# Patient Record
Sex: Female | Born: 1935 | Race: Black or African American | Hispanic: No | Smoking: Former smoker
Health system: Southern US, Community
[De-identification: ages and names within clinical notes are randomized; demographics above are authoritative.]

## PROBLEM LIST (undated history)

## (undated) DIAGNOSIS — I10 Essential (primary) hypertension: Secondary | ICD-10-CM

## (undated) DIAGNOSIS — J40 Bronchitis, not specified as acute or chronic: Secondary | ICD-10-CM

## (undated) DIAGNOSIS — J449 Chronic obstructive pulmonary disease, unspecified: Secondary | ICD-10-CM

## (undated) HISTORY — PX: TUBAL LIGATION: SHX77

## (undated) HISTORY — PX: EYE SURGERY: SHX253

---

## 1999-10-23 ENCOUNTER — Ambulatory Visit (HOSPITAL_COMMUNITY): Admission: RE | Admit: 1999-10-23 | Discharge: 1999-10-23 | Payer: Self-pay | Admitting: *Deleted

## 1999-11-05 ENCOUNTER — Other Ambulatory Visit: Admission: RE | Admit: 1999-11-05 | Discharge: 1999-11-05 | Payer: Self-pay | Admitting: Family Medicine

## 1999-11-17 ENCOUNTER — Encounter: Payer: Self-pay | Admitting: Family Medicine

## 1999-11-17 ENCOUNTER — Ambulatory Visit (HOSPITAL_COMMUNITY): Admission: RE | Admit: 1999-11-17 | Discharge: 1999-11-17 | Payer: Self-pay | Admitting: Family Medicine

## 2000-12-23 ENCOUNTER — Ambulatory Visit (HOSPITAL_COMMUNITY): Admission: RE | Admit: 2000-12-23 | Discharge: 2000-12-23 | Payer: Self-pay | Admitting: Family Medicine

## 2001-06-26 ENCOUNTER — Encounter: Admission: RE | Admit: 2001-06-26 | Discharge: 2001-06-26 | Payer: Self-pay | Admitting: Family Medicine

## 2001-06-26 ENCOUNTER — Encounter: Payer: Self-pay | Admitting: Family Medicine

## 2004-05-08 ENCOUNTER — Ambulatory Visit (HOSPITAL_COMMUNITY): Admission: RE | Admit: 2004-05-08 | Discharge: 2004-05-08 | Payer: Self-pay | Admitting: Family Medicine

## 2004-08-26 ENCOUNTER — Ambulatory Visit (HOSPITAL_COMMUNITY): Admission: RE | Admit: 2004-08-26 | Discharge: 2004-08-26 | Payer: Self-pay | Admitting: Gastroenterology

## 2005-06-15 ENCOUNTER — Ambulatory Visit (HOSPITAL_COMMUNITY): Admission: RE | Admit: 2005-06-15 | Discharge: 2005-06-15 | Payer: Self-pay | Admitting: Family Medicine

## 2006-08-01 ENCOUNTER — Ambulatory Visit (HOSPITAL_COMMUNITY): Admission: RE | Admit: 2006-08-01 | Discharge: 2006-08-01 | Payer: Self-pay | Admitting: Family Medicine

## 2007-04-24 ENCOUNTER — Encounter: Admission: RE | Admit: 2007-04-24 | Discharge: 2007-04-24 | Payer: Self-pay | Admitting: Family Medicine

## 2007-08-17 ENCOUNTER — Ambulatory Visit (HOSPITAL_COMMUNITY): Admission: RE | Admit: 2007-08-17 | Discharge: 2007-08-17 | Payer: Self-pay | Admitting: Internal Medicine

## 2007-11-20 ENCOUNTER — Ambulatory Visit (HOSPITAL_COMMUNITY): Admission: RE | Admit: 2007-11-20 | Discharge: 2007-11-20 | Payer: Self-pay | Admitting: Obstetrics

## 2007-12-06 ENCOUNTER — Encounter (INDEPENDENT_AMBULATORY_CARE_PROVIDER_SITE_OTHER): Payer: Self-pay | Admitting: Obstetrics

## 2007-12-06 ENCOUNTER — Ambulatory Visit (HOSPITAL_COMMUNITY): Admission: RE | Admit: 2007-12-06 | Discharge: 2007-12-06 | Payer: Self-pay | Admitting: Obstetrics

## 2007-12-16 ENCOUNTER — Emergency Department (HOSPITAL_COMMUNITY): Admission: EM | Admit: 2007-12-16 | Discharge: 2007-12-16 | Payer: Self-pay | Admitting: Emergency Medicine

## 2008-08-20 ENCOUNTER — Ambulatory Visit (HOSPITAL_COMMUNITY): Admission: RE | Admit: 2008-08-20 | Discharge: 2008-08-20 | Payer: Self-pay | Admitting: Family Medicine

## 2010-04-28 ENCOUNTER — Encounter: Admission: RE | Admit: 2010-04-28 | Discharge: 2010-04-28 | Payer: Self-pay | Admitting: Family Medicine

## 2011-03-16 NOTE — Op Note (Signed)
Sheri [name], Sheri [name]               ACCOUNT NO.:  [id number]   MEDICAL RECORD NO.:  [id number]          PATIENT TYPE:  AMB   LOCATION:  SDC                           FACILITY:  [facility]   PHYSICIAN:  Kathreen Cosier, M.D.DATE OF BIRTH:  1926-04-23   DATE OF PROCEDURE:  12/06/2007  DATE OF DISCHARGE:                               OPERATIVE REPORT   PREOPERATIVE DIAGNOSIS:  Postmenopausal bleeding.   POSTOPERATIVE DIAGNOSIS:  Postmenopausal bleeding.   PROCEDURE:  Hysteroscopy D&C.   Using MAC with the patient in the lithotomy position, the perineum and  vagina were prepped and draped.  The bladder was emptied with a straight  catheter.  Bimanual exam revealed the uterus to be small.  A speculum  was placed in the vagina.  Cervix was injected with 9 mL of 1%  Xylocaine.  The anterior lip of the cervix was grasped with a tenaculum.  The  endocervix was curetted and a small amount of tissue obtained.  The  endometrial cavity sounded to 9 cm, the cervix dilated to a #25 Pratt,  and the hysteroscope inserted.  The cavity was noted to be normal.  The  hysteroscope was removed.  Fluid deficit was  25 mL.  A sharp curettage  was then performed.  A small amount of tissue was obtained.   The patient tolerated the procedure well.  She was taken to the recovery  room in good condition.           ______________________________  Kathreen Cosier, M.D.     BAM/MEDQ  D:  12/06/2007  T:  12/06/2007  Job:  [id number]

## 2011-03-19 NOTE — Op Note (Signed)
NAMESANDY, HAYE               ACCOUNT NO.:  [id number]   MEDICAL RECORD NO.:  [id number]          PATIENT TYPE:  AMB   LOCATION:  ENDO                         FACILITY:  [facility]   PHYSICIAN:  Anselmo Rod, M.D.  DATE OF BIRTH:  05/23/1926   DATE OF PROCEDURE:  08/26/2004  DATE OF DISCHARGE:                                 OPERATIVE REPORT   PROCEDURE PERFORMED:  Colonoscopy.   ENDOSCOPIST:  Charna Elizabeth, M.D.   INSTRUMENT USED:  Olympus video colonoscope.   INDICATIONS FOR PROCEDURE:  The patient is a 76 year old African-American  female with mild anemia.  Hemoglobin 11.8 g/dl undergoing screening  colonoscopy to rule out colonic polyps, masses, etc.   PREPROCEDURE PREPARATION:  Informed consent was procured from the patient.  The patient was fasted for eight hours prior to the procedure and prepped  with a bottle of magnesium citrate and a gallon of GoLYTELY the night prior  to the procedure.   PREPROCEDURE PHYSICAL:  The patient had stable vital signs.  Neck supple.  Chest clear to auscultation.  S1 and S2 regular.  No murmurs, rubs or  gallops, rales, rhonchi or wheezing.  Abdomen soft with normal bowel sounds.   DESCRIPTION OF PROCEDURE:  The patient was placed in left lateral decubitus  position and sedated with 60 mg of Demerol and 6 mg of Versed in slow  incremental doses.  Once the patient was adequately sedated and maintained  on low flow oxygen and continuous cardiac monitoring, the Olympus video  colonoscope was advanced from the rectum to the cecum.  The appendicular  orifice and ileocecal valve were clearly visualized and photographed.  No  masses, polyps, erosions, ulcerations or diverticula were seen.  There was  some residual stool in the colon and multiple washes were done.  Retroflexion in the rectum revealed no abnormalities.   IMPRESSION:  1.  Essentially normal colonoscopy up to the cecum.  No masses, polyps, or      diverticula were seen.  2.   There was some residual stool in the colon and multiple washes were      done.   RECOMMENDATIONS:  1.  Repeat colonoscopy is recommended in the next 10 years unless the      patient develops any abnormal symptoms      in the interim.  2.  Continue high fiber diet with liberal fluid intake.  3.  Outpatient followup in the next two weeks for CBC and further      recommendations.       JNM/MEDQ  D:  08/26/2004  T:  08/26/2004  Job:  [id number]   cc:   Renaye Rakers, M.D.  [phone] [address], [address]  [place]  [place] [address]  Fax: [phone]

## 2011-07-23 LAB — CBC
HCT: 41.1
Hemoglobin: 13.8
MCHC: 33.5
MCV: 87.6
Platelets: 205
RBC: 4.69
RDW: 14.6
WBC: 3.9 — ABNORMAL LOW

## 2011-07-23 LAB — BASIC METABOLIC PANEL
BUN: 12
CO2: 25
Calcium: 9.3
Chloride: 103
Creatinine, Ser: 0.82
GFR calc Af Amer: >60
GFR calc non Af Amer: >60
Glucose, Bld: 104 — ABNORMAL HIGH
Potassium: 3.9
Sodium: 137

## 2011-11-18 ENCOUNTER — Other Ambulatory Visit: Payer: Self-pay | Admitting: Gastroenterology

## 2011-11-22 ENCOUNTER — Ambulatory Visit
Admission: RE | Admit: 2011-11-22 | Discharge: 2011-11-22 | Disposition: A | Discharge status: Home / Self Care | Payer: Medicare Other | Source: Ambulatory Visit | Attending: Gastroenterology | Admitting: Gastroenterology

## 2013-04-18 ENCOUNTER — Ambulatory Visit (HOSPITAL_COMMUNITY)
Admission: RE | Admit: 2013-04-18 | Discharge: 2013-04-18 | Disposition: A | Discharge status: Home / Self Care | Payer: Medicare PPO | Source: Ambulatory Visit | Attending: Pulmonary Disease | Admitting: Pulmonary Disease

## 2013-04-18 ENCOUNTER — Other Ambulatory Visit (HOSPITAL_COMMUNITY): Payer: Self-pay | Admitting: Pulmonary Disease

## 2013-04-18 DIAGNOSIS — J4489 Other specified chronic obstructive pulmonary disease: Secondary | ICD-10-CM | POA: Insufficient documentation

## 2013-04-18 DIAGNOSIS — R0602 Shortness of breath: Secondary | ICD-10-CM | POA: Insufficient documentation

## 2013-04-18 DIAGNOSIS — J449 Chronic obstructive pulmonary disease, unspecified: Secondary | ICD-10-CM | POA: Insufficient documentation

## 2014-11-26 ENCOUNTER — Ambulatory Visit
Admission: RE | Admit: 2014-11-26 | Discharge: 2014-11-26 | Disposition: A | Discharge status: Home / Self Care | Payer: Medicare PPO | Source: Ambulatory Visit | Attending: Orthopedic Surgery | Admitting: Orthopedic Surgery

## 2014-11-26 ENCOUNTER — Other Ambulatory Visit: Payer: Self-pay | Admitting: Orthopedic Surgery

## 2014-11-26 DIAGNOSIS — M5412 Radiculopathy, cervical region: Secondary | ICD-10-CM

## 2014-11-26 DIAGNOSIS — M19011 Primary osteoarthritis, right shoulder: Secondary | ICD-10-CM

## 2014-12-11 ENCOUNTER — Other Ambulatory Visit: Payer: Self-pay | Admitting: Family Medicine

## 2014-12-11 DIAGNOSIS — R42 Dizziness and giddiness: Secondary | ICD-10-CM

## 2014-12-11 DIAGNOSIS — R531 Weakness: Secondary | ICD-10-CM

## 2014-12-16 ENCOUNTER — Other Ambulatory Visit: Payer: Medicare PPO

## 2014-12-19 ENCOUNTER — Ambulatory Visit
Admission: RE | Admit: 2014-12-19 | Discharge: 2014-12-19 | Disposition: A | Discharge status: Home / Self Care | Payer: Medicare PPO | Source: Ambulatory Visit | Attending: Family Medicine | Admitting: Family Medicine

## 2014-12-19 ENCOUNTER — Inpatient Hospital Stay: Admission: RE | Admit: 2014-12-19 | Payer: Medicare PPO | Source: Ambulatory Visit

## 2014-12-19 DIAGNOSIS — R42 Dizziness and giddiness: Secondary | ICD-10-CM

## 2014-12-19 DIAGNOSIS — R531 Weakness: Secondary | ICD-10-CM

## 2015-01-04 ENCOUNTER — Encounter (HOSPITAL_COMMUNITY): Payer: Self-pay | Admitting: *Deleted

## 2015-01-04 ENCOUNTER — Emergency Department (HOSPITAL_COMMUNITY): Payer: Medicare PPO

## 2015-01-04 ENCOUNTER — Inpatient Hospital Stay (HOSPITAL_COMMUNITY)
Admission: EM | Admit: 2015-01-04 | Discharge: 2015-01-08 | DRG: 190 | Disposition: A | Discharge status: Home Health | Payer: Medicare PPO | Attending: Internal Medicine | Admitting: Internal Medicine

## 2015-01-04 DIAGNOSIS — J449 Chronic obstructive pulmonary disease, unspecified: Secondary | ICD-10-CM | POA: Diagnosis present

## 2015-01-04 DIAGNOSIS — D509 Iron deficiency anemia, unspecified: Secondary | ICD-10-CM | POA: Diagnosis present

## 2015-01-04 DIAGNOSIS — I10 Essential (primary) hypertension: Secondary | ICD-10-CM | POA: Diagnosis present

## 2015-01-04 DIAGNOSIS — J441 Chronic obstructive pulmonary disease with (acute) exacerbation: Secondary | ICD-10-CM | POA: Diagnosis present

## 2015-01-04 DIAGNOSIS — I272 Other secondary pulmonary hypertension: Secondary | ICD-10-CM | POA: Diagnosis present

## 2015-01-04 DIAGNOSIS — J9601 Acute respiratory failure with hypoxia: Secondary | ICD-10-CM | POA: Diagnosis present

## 2015-01-04 DIAGNOSIS — Z79899 Other long term (current) drug therapy: Secondary | ICD-10-CM | POA: Diagnosis not present

## 2015-01-04 DIAGNOSIS — N179 Acute kidney failure, unspecified: Secondary | ICD-10-CM | POA: Diagnosis present

## 2015-01-04 DIAGNOSIS — D649 Anemia, unspecified: Secondary | ICD-10-CM | POA: Diagnosis present

## 2015-01-04 DIAGNOSIS — Z87891 Personal history of nicotine dependence: Secondary | ICD-10-CM | POA: Diagnosis not present

## 2015-01-04 DIAGNOSIS — J9602 Acute respiratory failure with hypercapnia: Secondary | ICD-10-CM | POA: Diagnosis present

## 2015-01-04 DIAGNOSIS — Z91018 Allergy to other foods: Secondary | ICD-10-CM | POA: Diagnosis not present

## 2015-01-04 DIAGNOSIS — I159 Secondary hypertension, unspecified: Secondary | ICD-10-CM

## 2015-01-04 HISTORY — DX: Chronic obstructive pulmonary disease, unspecified: J44.9

## 2015-01-04 HISTORY — DX: Bronchitis, not specified as acute or chronic: J40

## 2015-01-04 LAB — I-STAT ARTERIAL BLOOD GAS, ED
Acid-base deficit: 2 mmol/L (ref 0.0–2.0)
Bicarbonate: 24.5 mEq/L — ABNORMAL HIGH (ref 20.0–24.0)
O2 Saturation: 99 %
TCO2: 26 mmol/L (ref 0–100)
pCO2 arterial: 48.7 mmHg — ABNORMAL HIGH (ref 35.0–45.0)
pH, Arterial: 7.309 — ABNORMAL LOW (ref 7.350–7.450)
pO2, Arterial: 146 mmHg — ABNORMAL HIGH (ref 80.0–100.0)

## 2015-01-04 LAB — RETICULOCYTES
RBC.: 4.19 MIL/uL (ref 3.87–5.11)
Retic Count, Absolute: 46.1 10*3/uL (ref 19.0–186.0)
Retic Ct Pct: 1.1 % (ref 0.4–3.1)

## 2015-01-04 LAB — BASIC METABOLIC PANEL
Anion gap: 10 (ref 5–15)
BUN: 11 mg/dL (ref 6–23)
CO2: 24 mmol/L (ref 19–32)
Calcium: 8.3 mg/dL — ABNORMAL LOW (ref 8.4–10.5)
Chloride: 105 mmol/L (ref 96–112)
Creatinine, Ser: 0.89 mg/dL (ref 0.50–1.10)
GFR calc Af Amer: 66 mL/min — ABNORMAL LOW (ref >90)
GFR calc non Af Amer: 57 mL/min — ABNORMAL LOW (ref >90)
Glucose, Bld: 155 mg/dL — ABNORMAL HIGH (ref 70–99)
Potassium: 3.7 mmol/L (ref 3.5–5.1)
Sodium: 139 mmol/L (ref 135–145)

## 2015-01-04 LAB — FERRITIN: Ferritin: 21 ng/mL (ref 10–291)

## 2015-01-04 LAB — FOLATE: Folate: >20 ng/mL

## 2015-01-04 LAB — CBC
HCT: 34.2 % — ABNORMAL LOW (ref 36.0–46.0)
Hemoglobin: 10.6 g/dL — ABNORMAL LOW (ref 12.0–15.0)
MCH: 24.3 pg — ABNORMAL LOW (ref 26.0–34.0)
MCHC: 31 g/dL (ref 30.0–36.0)
MCV: 78.4 fL (ref 78.0–100.0)
Platelets: 280 10*3/uL (ref 150–400)
RBC: 4.36 MIL/uL (ref 3.87–5.11)
RDW: 20 % — ABNORMAL HIGH (ref 11.5–15.5)
WBC: 10.1 10*3/uL (ref 4.0–10.5)

## 2015-01-04 LAB — TSH: TSH: 0.58 u[IU]/mL (ref 0.350–4.500)

## 2015-01-04 LAB — MRSA PCR SCREENING: MRSA by PCR: NEGATIVE

## 2015-01-04 LAB — I-STAT TROPONIN, ED: Troponin i, poc: 0 ng/mL (ref 0.00–0.08)

## 2015-01-04 LAB — IRON AND TIBC
Iron: 17 ug/dL — ABNORMAL LOW (ref 42–145)
Saturation Ratios: 5 % — ABNORMAL LOW (ref 20–55)
TIBC: 313 ug/dL (ref 250–470)
UIBC: 296 ug/dL (ref 125–400)

## 2015-01-04 LAB — VITAMIN B12: Vitamin B-12: 474 pg/mL (ref 211–911)

## 2015-01-04 MED ORDER — SODIUM CHLORIDE 0.9 % IV SOLN: 250.0000 mL | INTRAVENOUS | Status: DC | PRN

## 2015-01-04 MED ORDER — MORPHINE SULFATE 2 MG/ML IJ SOLN
1.0000 mg | INTRAMUSCULAR | Status: DC | PRN
  Administered 2015-01-04: 1 mg via INTRAVENOUS
  Filled 2015-01-04 (×3): qty 1

## 2015-01-04 MED ORDER — SODIUM CHLORIDE 0.9 % IJ SOLN
3.0000 mL | Freq: Two times a day (BID) | INTRAMUSCULAR | Status: DC
  Administered 2015-01-05 – 2015-01-08 (×6): 3 mL via INTRAVENOUS

## 2015-01-04 MED ORDER — CETYLPYRIDINIUM CHLORIDE 0.05 % MT LIQD
7.0000 mL | Freq: Two times a day (BID) | OROMUCOSAL | Status: DC
  Administered 2015-01-04 – 2015-01-07 (×5): 7 mL via OROMUCOSAL

## 2015-01-04 MED ORDER — SODIUM CHLORIDE 0.9 % IJ SOLN
3.0000 mL | Freq: Two times a day (BID) | INTRAMUSCULAR | Status: DC
  Administered 2015-01-05 – 2015-01-07 (×2): 3 mL via INTRAVENOUS

## 2015-01-04 MED ORDER — ETODOLAC 400 MG PO TABS
400.0000 mg | ORAL_TABLET | Freq: Two times a day (BID) | ORAL | Status: DC
  Administered 2015-01-04 – 2015-01-08 (×8): 400 mg via ORAL
  Filled 2015-01-04 (×10): qty 1

## 2015-01-04 MED ORDER — SODIUM CHLORIDE 0.9 % IJ SOLN: 3.0000 mL | INTRAMUSCULAR | Status: DC | PRN

## 2015-01-04 MED ORDER — ONDANSETRON HCL 4 MG/2ML IJ SOLN: 4.0000 mg | Freq: Four times a day (QID) | INTRAMUSCULAR | Status: DC | PRN

## 2015-01-04 MED ORDER — BENZONATATE 100 MG PO CAPS
200.0000 mg | ORAL_CAPSULE | Freq: Three times a day (TID) | ORAL | Status: DC | PRN
  Administered 2015-01-06 – 2015-01-07 (×2): 200 mg via ORAL
  Filled 2015-01-04 (×2): qty 2

## 2015-01-04 MED ORDER — METHYLPREDNISOLONE SODIUM SUCC 125 MG IJ SOLR
60.0000 mg | Freq: Four times a day (QID) | INTRAMUSCULAR | Status: DC
  Administered 2015-01-05 – 2015-01-06 (×6): 60 mg via INTRAVENOUS
  Filled 2015-01-04 (×8): qty 0.96
  Filled 2015-01-04 (×2): qty 2

## 2015-01-04 MED ORDER — LEVOFLOXACIN IN D5W 750 MG/150ML IV SOLN: 750.0000 mg | INTRAVENOUS | Status: DC

## 2015-01-04 MED ORDER — IPRATROPIUM-ALBUTEROL 0.5-2.5 (3) MG/3ML IN SOLN
RESPIRATORY_TRACT | Status: AC
  Filled 2015-01-04: qty 3

## 2015-01-04 MED ORDER — SODIUM CHLORIDE 0.9 % IV SOLN
INTRAVENOUS | Status: DC
  Administered 2015-01-04: 21:00:00 via INTRAVENOUS

## 2015-01-04 MED ORDER — ALBUTEROL SULFATE (2.5 MG/3ML) 0.083% IN NEBU
5.0000 mg | INHALATION_SOLUTION | Freq: Once | RESPIRATORY_TRACT | Status: AC
  Administered 2015-01-04: 5 mg via RESPIRATORY_TRACT
  Filled 2015-01-04: qty 6

## 2015-01-04 MED ORDER — ONDANSETRON HCL 4 MG PO TABS: 4.0000 mg | ORAL_TABLET | Freq: Four times a day (QID) | ORAL | Status: DC | PRN

## 2015-01-04 MED ORDER — METHYLPREDNISOLONE SODIUM SUCC 125 MG IJ SOLR
125.0000 mg | Freq: Once | INTRAMUSCULAR | Status: AC
  Administered 2015-01-04: 125 mg via INTRAVENOUS
  Filled 2015-01-04: qty 2

## 2015-01-04 MED ORDER — HEPARIN SODIUM (PORCINE) 5000 UNIT/ML IJ SOLN
5000.0000 [IU] | Freq: Three times a day (TID) | INTRAMUSCULAR | Status: DC
  Administered 2015-01-04 – 2015-01-08 (×12): 5000 [IU] via SUBCUTANEOUS
  Filled 2015-01-04 (×14): qty 1

## 2015-01-04 MED ORDER — IPRATROPIUM BROMIDE 0.02 % IN SOLN
0.5000 mg | Freq: Once | RESPIRATORY_TRACT | Status: AC
  Administered 2015-01-04: 0.5 mg via RESPIRATORY_TRACT
  Filled 2015-01-04: qty 2.5

## 2015-01-04 MED ORDER — HYDROCODONE-HOMATROPINE 5-1.5 MG/5ML PO SYRP: 5.0000 mL | ORAL_SOLUTION | Freq: Four times a day (QID) | ORAL | Status: DC | PRN

## 2015-01-04 MED ORDER — IPRATROPIUM-ALBUTEROL 0.5-2.5 (3) MG/3ML IN SOLN
3.0000 mL | RESPIRATORY_TRACT | Status: DC
  Administered 2015-01-04 (×3): 3 mL via RESPIRATORY_TRACT
  Filled 2015-01-04 (×2): qty 3

## 2015-01-04 MED ORDER — LEVOFLOXACIN IN D5W 750 MG/150ML IV SOLN
750.0000 mg | Freq: Once | INTRAVENOUS | Status: DC
  Filled 2015-01-04: qty 150

## 2015-01-04 MED ORDER — LEVOFLOXACIN IN D5W 750 MG/150ML IV SOLN
750.0000 mg | Freq: Once | INTRAVENOUS | Status: AC
  Administered 2015-01-04: 750 mg via INTRAVENOUS

## 2015-01-04 MED ORDER — AMLODIPINE BESYLATE 5 MG PO TABS
5.0000 mg | ORAL_TABLET | Freq: Every day | ORAL | Status: DC
  Administered 2015-01-04 – 2015-01-08 (×5): 5 mg via ORAL
  Filled 2015-01-04 (×5): qty 1

## 2015-01-04 NOTE — Progress Notes (Addendum)
ANTIBIOTIC CONSULT NOTE - INITIAL  Pharmacy Consult for Levaquin Indication: Tracheobronchitis   Allergies  Allergen Reactions  . Arlice ColtMustard [Allyl Isothiocyanate] Hives    Patient Measurements:     Vital Signs: Temp: 98.2 F (36.8 C) (03/05 1103) Temp Source: Oral (03/05 1103) BP: 163/76 mmHg (03/05 1230) Pulse Rate: 111 (03/05 1230) Intake/Output from previous day:   Intake/Output from this shift:    Labs:  Recent Labs  01/04/15 1143  WBC 10.1  HGB 10.6*  PLT 280  CREATININE 0.89   CrCl cannot be calculated (Unknown ideal weight.). No results for input(s): VANCOTROUGH, VANCOPEAK, VANCORANDOM, GENTTROUGH, GENTPEAK, GENTRANDOM, TOBRATROUGH, TOBRAPEAK, TOBRARND, AMIKACINPEAK, AMIKACINTROU, AMIKACIN in the last 72 hours.   Microbiology: No results found for this or any previous visit (from the past 720 hour(s)).  Medical History: Past Medical History  Diagnosis Date  . Bronchitis     Medications:   (Not in a hospital admission) Assessment: [age] YOF with worsening dyspnea for several days. Pharmacy consulted to start Levaquin for acute tracheobronchitis. WBC wnl. Pt is afebrile.   Cultures: 3/5 Blood Cx x2>>  Goal of Therapy:  Resolution of infection   Plan:  -Levaquin 750 mg IV x 1 dose.  -F/u patient weight and height and enter maintenance dose -Monitor cultures and s/s of clinical improvement.   Vinnie LevelBenjamin Mancheril, PharmD., BCPS Clinical Pharmacist Pager [phone]  Addendum -SCr= 0.89, CrCl ~ 40  Plan -Levaquin 750mg  IV q48hr  Harland Germanndrew Montravious Weigelt, Pharm D 01/04/2015 6:45 PM

## 2015-01-04 NOTE — H&P (Signed)
[facility] History and Physical                                                                                    Sheri [name], is a 81 y.o. female  MRN: [id number]   DOB - 08/24/1926  Admit Date - 01/04/2015  Outpatient Primary MD for the patient is No primary care provider on file.  With History of -  Past Medical History  Diagnosis Date  . Bronchitis       History reviewed. No pertinent past surgical history.  in for   Chief Complaint  Patient presents with  . Shortness of Breath     HPI Sheri Facehelma Ishmael  is a 81 y.o. female, former tobacco user, quit 3 years ago, known COPD who presented to the ER with acute onset of shortness of breath. Patient reports upper respiratory infection type symptoms for several days with productive cough and clear sputum with significant worsening of symptoms over the past 24 hours. Upon presentation to the ER she had diffuse tight wheezing which required steroids and BiPAP and multiple neb treatments. She improved somewhat but needed to remain on BiPAP.  In the ER her ABG revealed as but her acidosis with pH 7.31 and PCO2 48.7. She had some mild acute renal failure with a BUN 11 and creatinine 0.9 but with GFR slightly lower than her baseline. Her glucose was mildly elevated at 155 after the steroids. Her anion gap was normal. Her troponin was 0.00. She also had a mild anemia hemoglobin 10.6. Her chest x-ray was consistent with COPD and chronic bronchitis without any focal infiltrate. Her EKG demonstrated sinus tachycardia with Right atrial enlargement and Rightward axis system with a Pulmonary disease pattern.  Upon my examination of the patient patient was resting comfortably on BiPAP although she developed more rapid respiratory rate and increased effort when attempting to answer questions. She confirms upper respiratory symptoms for several days, worse over the past 24 hours. She reported a clear productive cough and chills without definitive  fevers.  Review of Systems   In addition to the HPI above,  No Fever, +chills, no  myalgias or other constitutional symptoms No Headache, changes with Vision or hearing, new weakness, tingling, numbness in any extremity, No problems swallowing food or Liquids, indigestion/reflux No Chest pain, palpitations, orthopnea No Abdominal pain, N/V; no melena or hematochezia, no dark tarry stools, Bowel movements are regular, No dysuria, hematuria or flank pain No new skin rashes, lesions, masses or bruises, No new joints pains-aches No recent weight gain or loss No polyuria, polydypsia or polyphagia,  *A full 10 point Review of Systems was done, except as stated above, all other Review of Systems were negative.  Social History History  Substance Use Topics  . Smoking status: Not on file  . Smokeless tobacco: Not on file  . Alcohol Use: No    Family History History reviewed. No pertinent family history.  Prior to Admission medications   Medication Sig Start Date End Date Taking? Authorizing Provider  amLODipine (NORVASC) 5 MG tablet Take 5 mg by mouth daily.   Yes Historical Provider, MD  dexamethasone (DECADRON) 4 MG tablet  Take 4 mg by mouth daily.   Yes Historical Provider, MD  etodolac (LODINE) 400 MG tablet Take 400 mg by mouth 2 (two) times daily.   Yes Historical Provider, MD  Ipratropium-Albuterol (COMBIVENT) 20-100 MCG/ACT AERS respimat Inhale 1 puff into the lungs every 6 (six) hours.   Yes Historical Provider, MD  Umeclidinium-Vilanterol 62.5-25 MCG/INH AEPB Inhale 1 puff into the lungs daily. as directed   Yes Historical Provider, MD  Vitamin D, Ergocalciferol, (DRISDOL) 50000 UNITS CAPS capsule Take 50,000 Units by mouth every 7 (seven) days.   Yes Historical Provider, MD    Allergies  Allergen Reactions  . Arlice Colt Isothiocyanate] Hives    Physical Exam  Vitals  Blood pressure 163/76, pulse 111, temperature 98.2 F (36.8 C), temperature source Oral, resp.  rate 28, SpO2 100 %.   General:  In mild acute respiratory distress as evidenced by increased respiratory rate and respiratory effort when attempting to talk, appears healthy and well nourished  Psych:  Normal affect, Denies Suicidal or Homicidal ideations, Awake Alert, Oriented X 3. Speech and thought patterns are clear and appropriate, no apparent short term memory deficits  Neuro:   No focal neurological deficits, CN II through XII intact, Strength 5/5 all 4 extremities, Sensation intact all 4 extremities.  ENT:  Ears and Eyes appear Normal, Conjunctivae clear, PER. Moist oral mucosa without erythema or exudates.  Neck:  Supple, No lymphadenopathy appreciated  Respiratory: Symmetrical air movement but is quite tight posteriorly with scattered wheezes, no rhonchi, still on BiPAP/FiO2 40%  Cardiac:  RRR, No Murmurs, no LE edema noted, no JVD, No carotid bruits, peripheral pulses palpable at 2+  Abdomen:  Positive bowel sounds, Soft, Non tender, Non distended,  No masses appreciated, no obvious hepatosplenomegaly  Skin:  No Cyanosis, Normal Skin Turgor, No Skin Rash or Bruise.  Extremities: Symmetrical without obvious trauma or injury,  no effusions.  Data Review  CBC  Recent Labs Lab 01/04/15 1143  WBC 10.1  HGB 10.6*  HCT 34.2*  PLT 280  MCV 78.4  MCH 24.3*  MCHC 31.0  RDW 20.0*    Chemistries   Recent Labs Lab 01/04/15 1143  NA 139  K 3.7  CL 105  CO2 24  GLUCOSE 155*  BUN 11  CREATININE 0.89  CALCIUM 8.3*    CrCl cannot be calculated (Unknown ideal weight.).  No results for input(s): TSH, T4TOTAL, T3FREE, THYROIDAB in the last 72 hours.  Invalid input(s): FREET3  Coagulation profile No results for input(s): INR, PROTIME in the last 168 hours.  No results for input(s): DDIMER in the last 72 hours.  Cardiac Enzymes No results for input(s): CKMB, TROPONINI, MYOGLOBIN in the last 168 hours.  Invalid input(s): CK  Invalid input(s):  POCBNP  Urinalysis No results found for: COLORURINE, APPEARANCEUR, LABSPEC, PHURINE, GLUCOSEU, HGBUR, BILIRUBINUR, KETONESUR, PROTEINUR, UROBILINOGEN, NITRITE, LEUKOCYTESUR  Imaging results:   US Aorta  12/19/2014   CLINICAL DATA:  Weakness. Dizziness. Questionable abdominal aortic aneurysm. History of hypertension.  EXAM: ULTRASOUND OF ABDOMINAL AORTA  TECHNIQUE: Ultrasound examination of the abdominal aorta was performed to evaluate for abdominal aortic aneurysm.  COMPARISON:  None.  FINDINGS: Abdominal Aorta  Aorta is ectatic, but no focal aneurysm is seen. There is atherosclerotic irregularity along the aorta, most evident distally.  Maximum AP  Diameter: 2.6 cm proximally, 2.0 cm in the midportion and 1.9 cm distally.  Maximum TRV  Diameter: 2.5 cm proximally, 2.2 cm in the midportion and 2.6 cm distally.  Common  iliac arteries measure 1 cm in diameter.  IMPRESSION: 1. Abdominal aorta is ectatic with no focal aneurysm seen. Largest dimension is a transverse dimension of the distal aorta, 2.6 cm, with the AP dimension being 1.9 cm. Atherosclerotic irregularity is noted along the aorta.   Electronically Signed   By: Amie Portland M.D.   On: 12/19/2014 21:55   Dg Chest Portable 1 View  01/04/2015   CLINICAL DATA:  Short of breath with chest tightness. History of bronchitis.  EXAM: PORTABLE CHEST - 1 VIEW  COMPARISON:  Of 04/18/2013  FINDINGS: Numerous leads and wires project over the chest. Midline trachea. Normal heart size with advanced atherosclerosis in the transverse aorta. No pleural effusion or pneumothorax. Right paratracheal soft tissue fullness is similar and likely due to great vessel shadow. No pleural effusion or pneumothorax. Hyperinflation. Diffuse peribronchial thickening.  IMPRESSION: COPD/chronic bronchitis.  No acute superimposed process.  Advanced for age aortic atherosclerosis.   Electronically Signed   By: Jeronimo Greaves M.D.   On: 01/04/2015 12:57     EKG: Sinus tachycardia right  axis deviation and right atrial enlargement concerning for a pulmonary pattern, no acute ST-T wave changes concerning for ischemia   Assessment & Plan  Active Problems:   COPD exacerbation with Acute respiratory failure with hypoxia and hypercarbia -Admit to stepdown -Continue BiPAP and wean as tolerated -Continue nebulizers; if persistent tachycardia becomes a problem we'll change to Xopenex -Empiric antibiotics with Levaquin to cover for possible acute tracheobronchitis -Supportive care with Tessalon Perles and antitussive syrup -IV fluid hydration for her insensible fluid losses from pulmonary illness -Given right-sided abnormalities on EKG turning for underlying pulmonary disease we'll obtain a 2-D echocardiogram to check for pulmonary hypertension i.e. evidence of cor pulmonale -Continue IV steroids; if develops hyperglycemia recommend begin checking CBGs regularly and provide sliding scale insulin    HTN (hypertension) -Current blood pressure moderately controlled -Continue home medications with sip of water    Acute renal failure -Gentle IV fluid hydration as above    Former tobacco use -Patient stopped tobacco 3 years prior    Anemia -Has become slightly more progressive since 2012 when she underwent colonoscopy which showed no evidence of polyp or mass -Check anemia panel and TSH    DVT Prophylaxis: Subcutaneous heparin  Family Communication:   Multiple family members at bedside with patient's permission  Code Status:  Full code  Condition:  Stable  Time spent in minutes : 60   Dhanya Bogle L. ANP on 01/04/2015 at 3:01 PM  Between 7am to 7pm - Pager - [phone]  After 7pm go to [email or web address] - password [email or web address]  And look for the night coverage person covering me after hours  [facility]

## 2015-01-04 NOTE — Progress Notes (Signed)
Pt denies using BIPAP at home, and is currently in no respiratory distress. Machine removed form the room, but will be available should she need it .

## 2015-01-04 NOTE — ED Provider Notes (Signed)
CSN: [id number]     Arrival date & time 01/04/15  1052 History   First MD Initiated Contact with Patient 01/04/15 1131     Chief Complaint  Patient presents with  . Shortness of Breath     (Consider location/radiation/quality/duration/timing/severity/associated sxs/prior Treatment) HPI   CODE STATUS: pt DOES want intubation if she were to stop breathing.  LEVEL V caveat - respiratory distress.  PCP: Dr. Renaye Rakers- PCP Blood pressure 163/76, pulse 111, temperature 98.2 F (36.8 C), temperature source Oral, resp. rate 28, SpO2 100 %.  Sheri [name] is a 81 y.o.female with a significant PMH of COPD and diabetes presents to the ER with complaints of respiratory distress. She is brought to the ER by her two daughters who report this morning the patient told them that she was unable to breath last night due to severe SOB. The patient reports the SOB is severe and she has had recent cough without fevers, vomiting, weakness or chills. She has not had to be intubated in the past and denies needing to be admitted for SOB in the past. The patient is in respiratory distress and unable to answer many questions. 1-2 words answers. Heavy retractions. Patient reports getting fatigued and is starting to feel tired.  Past Medical History  Diagnosis Date  . Bronchitis    History reviewed. No pertinent past surgical history. History reviewed. No pertinent family history. History  Substance Use Topics  . Smoking status: Not on file  . Smokeless tobacco: Not on file  . Alcohol Use: No   OB History    No data available     Review of Systems  LEVEL V caveat - respiratory distress.  Allergies  Mustard  Home Medications   Prior to Admission medications   Medication Sig Start Date End Date Taking? Authorizing Provider  amLODipine (NORVASC) 5 MG tablet Take 5 mg by mouth daily.   Yes Historical Provider, MD  dexamethasone (DECADRON) 4 MG tablet Take 4 mg by mouth daily.   Yes Historical  Provider, MD  etodolac (LODINE) 400 MG tablet Take 400 mg by mouth 2 (two) times daily.   Yes Historical Provider, MD  Ipratropium-Albuterol (COMBIVENT) 20-100 MCG/ACT AERS respimat Inhale 1 puff into the lungs every 6 (six) hours.   Yes Historical Provider, MD  Umeclidinium-Vilanterol 62.5-25 MCG/INH AEPB Inhale 1 puff into the lungs daily. as directed   Yes Historical Provider, MD  Vitamin D, Ergocalciferol, (DRISDOL) 50000 UNITS CAPS capsule Take 50,000 Units by mouth every 7 (seven) days.   Yes Historical Provider, MD   BP 163/76 mmHg  Pulse 111  Temp(Src) 98.2 F (36.8 C) (Oral)  Resp 28  SpO2 100% Physical Exam  Constitutional: She appears well-developed and well-nourished. She appears distressed.  HENT:  Head: Normocephalic and atraumatic.  Eyes: Pupils are equal, round, and reactive to light.  Neck: Normal range of motion. Neck supple.  Cardiovascular: Regular rhythm.  Tachycardia present.   Pulmonary/Chest: Accessory muscle usage present. She is in respiratory distress. She has decreased breath sounds. She has wheezes (diffuse wheezing).  Abdominal: Soft.  Musculoskeletal:  No lower extremity swelling.  Neurological: She is alert.  Skin: Skin is warm and dry.  Nursing note and vitals reviewed.   ED Course  Procedures (including critical care time) Labs Review Labs Reviewed  BASIC METABOLIC PANEL - Abnormal; Notable for the following:    Glucose, Bld 155 (*)    Calcium 8.3 (*)    GFR calc non Af Amer 57 (*)  GFR calc Af Amer 66 (*)    All other components within normal limits  CBC - Abnormal; Notable for the following:    Hemoglobin 10.6 (*)    HCT 34.2 (*)    MCH 24.3 (*)    RDW 20.0 (*)    All other components within normal limits  I-STAT ARTERIAL BLOOD GAS, ED - Abnormal; Notable for the following:    pH, Arterial 7.309 (*)    pCO2 arterial 48.7 (*)    pO2, Arterial 146.0 (*)    Bicarbonate 24.5 (*)    All other components within normal limits  I-STAT  TROPOININ, ED  I-STAT ARTERIAL BLOOD GAS, ED    Imaging Review Dg Chest Portable 1 View  01/04/2015   CLINICAL DATA:  Short of breath with chest tightness. History of bronchitis.  EXAM: PORTABLE CHEST - 1 VIEW  COMPARISON:  Of 04/18/2013  FINDINGS: Numerous leads and wires project over the chest. Midline trachea. Normal heart size with advanced atherosclerosis in the transverse aorta. No pleural effusion or pneumothorax. Right paratracheal soft tissue fullness is similar and likely due to great vessel shadow. No pleural effusion or pneumothorax. Hyperinflation. Diffuse peribronchial thickening.  IMPRESSION: COPD/chronic bronchitis.  No acute superimposed process.  Advanced for age aortic atherosclerosis.   Electronically Signed   By: Jeronimo GreavesKyle  Talbot M.D.   On: 01/04/2015 12:57     EKG Interpretation None      MDM   Final diagnoses:  COPD exacerbation    CRITICAL CARE Performed by: Dorthula MatasGREENE,Jayden Rudge G Total critical care time: 60 Critical care time was exclusive of separately billable procedures and treating other patients. Critical care was necessary to treat or prevent imminent or life-threatening deterioration. Critical care was time spent personally by me on the following activities: development of treatment plan with patient and/or surrogate as well as nursing, discussions with consultants, evaluation of patient's response to treatment, examination of patient, obtaining history from patient or surrogate, ordering and performing treatments and interventions, ordering and review of laboratory studies, ordering and review of radiographic studies, pulse oximetry and re-evaluation of patient's condition.  Patient fatigued and has very increased effort of breathing. Dr. Rosalia Hammersay was in a code with another patient therefore Dr. Freida BusmanAllen, Ethelene Brownsnthony was called in to see patient to see if he agrees to start BiPap, Dr. Freida BusmanAllen agrees.   Medications  ipratropium-albuterol (DUONEB) 0.5-2.5 (3) MG/3ML nebulizer  solution 3 mL (3 mLs Nebulization Given 01/04/15 1255)  albuterol (PROVENTIL) (2.5 MG/3ML) 0.083% nebulizer solution 5 mg (5 mg Nebulization Given 01/04/15 1127)  ipratropium (ATROVENT) nebulizer solution 0.5 mg (0.5 mg Nebulization Given 01/04/15 1127)  methylPREDNISolone sodium succinate (SOLU-MEDROL) 125 mg/2 mL injection 125 mg (125 mg Intravenous Given 01/04/15 1223)   Patient has received two rounds of breathing treatments and steroids. She reports feeling much better on BiPap and looks better with decreased effort of breathing and is no longer diaphoretic.  Revonda StandardAllison, NP with [facility] has agreed to admit patient. She says she will put in orders.  Filed Vitals:   01/04/15 1230  BP: 163/76  Pulse: 111  Temp:   Resp: [id number]       Bakari Nikolai G Mert Dietrick, PA-C 01/04/15 1357  Hilario Quarryanielle S Ray, MD 01/04/15 1501

## 2015-01-04 NOTE — Progress Notes (Addendum)
Attempted ABG and unsuccessful x2. Called for 2nd RT to attempt. Will be attempted again once 2nd RT comes.

## 2015-01-04 NOTE — ED Notes (Signed)
Pt reports having sob, not feeling well since last night, hx of bronchitis. spo2 92% at triage. Denies swelling to extremities.

## 2015-01-05 DIAGNOSIS — D509 Iron deficiency anemia, unspecified: Secondary | ICD-10-CM

## 2015-01-05 DIAGNOSIS — I272 Other secondary pulmonary hypertension: Secondary | ICD-10-CM

## 2015-01-05 LAB — COMPREHENSIVE METABOLIC PANEL
ALT: 9 U/L (ref 0–35)
AST: 19 U/L (ref 0–37)
Albumin: 2.9 g/dL — ABNORMAL LOW (ref 3.5–5.2)
Alkaline Phosphatase: 55 U/L (ref 39–117)
Anion gap: 7 (ref 5–15)
BUN: 14 mg/dL (ref 6–23)
CO2: 26 mmol/L (ref 19–32)
Calcium: 8.6 mg/dL (ref 8.4–10.5)
Chloride: 106 mmol/L (ref 96–112)
Creatinine, Ser: 1.04 mg/dL (ref 0.50–1.10)
GFR calc Af Amer: 55 mL/min — ABNORMAL LOW (ref >90)
GFR calc non Af Amer: 47 mL/min — ABNORMAL LOW (ref >90)
Glucose, Bld: 132 mg/dL — ABNORMAL HIGH (ref 70–99)
Potassium: 4.8 mmol/L (ref 3.5–5.1)
Sodium: 139 mmol/L (ref 135–145)
Total Bilirubin: 0.5 mg/dL (ref 0.3–1.2)
Total Protein: 6.5 g/dL (ref 6.0–8.3)

## 2015-01-05 LAB — CBC
HCT: 30.1 % — ABNORMAL LOW (ref 36.0–46.0)
Hemoglobin: 9.2 g/dL — ABNORMAL LOW (ref 12.0–15.0)
MCH: 23.9 pg — ABNORMAL LOW (ref 26.0–34.0)
MCHC: 30.6 g/dL (ref 30.0–36.0)
MCV: 78.2 fL (ref 78.0–100.0)
Platelets: 267 10*3/uL (ref 150–400)
RBC: 3.85 MIL/uL — ABNORMAL LOW (ref 3.87–5.11)
RDW: 20.1 % — ABNORMAL HIGH (ref 11.5–15.5)
WBC: 6.7 10*3/uL (ref 4.0–10.5)

## 2015-01-05 MED ORDER — IPRATROPIUM-ALBUTEROL 0.5-2.5 (3) MG/3ML IN SOLN
3.0000 mL | Freq: Four times a day (QID) | RESPIRATORY_TRACT | Status: DC
  Administered 2015-01-05 – 2015-01-07 (×7): 3 mL via RESPIRATORY_TRACT
  Filled 2015-01-05 (×9): qty 3

## 2015-01-05 MED ORDER — POLYETHYLENE GLYCOL 3350 17 G PO PACK
17.0000 g | PACK | Freq: Two times a day (BID) | ORAL | Status: DC
  Administered 2015-01-05 – 2015-01-07 (×3): 17 g via ORAL
  Filled 2015-01-05 (×7): qty 1

## 2015-01-05 MED ORDER — FERROUS SULFATE 325 (65 FE) MG PO TABS
325.0000 mg | ORAL_TABLET | Freq: Two times a day (BID) | ORAL | Status: DC
  Administered 2015-01-05 – 2015-01-08 (×7): 325 mg via ORAL
  Filled 2015-01-05 (×9): qty 1

## 2015-01-05 MED ORDER — LEVOFLOXACIN 250 MG PO TABS
250.0000 mg | ORAL_TABLET | Freq: Every day | ORAL | Status: DC
  Administered 2015-01-05 – 2015-01-08 (×4): 250 mg via ORAL
  Filled 2015-01-05 (×5): qty 1

## 2015-01-05 MED ORDER — ACETAMINOPHEN 325 MG PO TABS
650.0000 mg | ORAL_TABLET | Freq: Four times a day (QID) | ORAL | Status: DC | PRN
Start: 2015-01-05 — End: 2015-01-08
  Administered 2015-01-05: 650 mg via ORAL
  Filled 2015-01-05: qty 2

## 2015-01-05 NOTE — Progress Notes (Signed)
  Echocardiogram 2D Echocardiogram has been performed.  Janalyn HarderWest, Girtha Kilgore R 01/05/2015, 1:21 PM

## 2015-01-05 NOTE — Evaluation (Signed)
Physical Therapy Evaluation Patient Details Name: Sheri [name] MRN: [id number] DOB: 04/02/1926 Today's Date: 01/05/2015   History of Present Illness  Patient is an 81 yo female admitted 01/04/15 with COPD exacerbation, hypoxia, HTN.  PMH:  COPD, HTN  Clinical Impression  Patient presents with problems listed below.  Will benefit from acute PT to maximize independence prior to return home with family.  Patient with decreased balance (now and pta).  Recommend use of RW and f/u HHPT.    Follow Up Recommendations Home health PT;Supervision/Assistance - 24 hour    Equipment Recommendations  Rolling walker with 5" wheels    Recommendations for Other Services       Precautions / Restrictions Precautions Precautions: Fall Precaution Comments: Patient reports "almost falling" several times prior to admit due to decreased balance. Restrictions Weight Bearing Restrictions: No      Mobility  Bed Mobility Overal bed mobility: Needs Assistance Bed Mobility: Supine to Sit     Supine to sit: Min guard     General bed mobility comments: Verbal cues for technique.  No physical assistance needed.  Min guard for safety.  Transfers Overall transfer level: Needs assistance Equipment used: 1 person hand held assist Transfers: Sit to/from Stand Sit to Stand: Min assist         General transfer comment: Patient able to power up to standing with min assist for balance/safety from bed, recliner, and toilet.  Ambulation/Gait Ambulation/Gait assistance: Min assist Ambulation Distance (Feet): 25 Feet (5' to chair and 20' to/from bathroom) Assistive device: 1 person hand held assist Gait Pattern/deviations: Step-through pattern;Decreased step length - right;Decreased step length - left;Decreased stride length;Shuffle Gait velocity: Decreased Gait velocity interpretation: Below normal speed for age/gender General Gait Details: Provided patient with hand-hold assist for balance/safety.   Patient with loss of balance x2 during this short distance.  Fatigues quickly - O2 sats remained in 90's, but patient with dyspnea 3/4.  Stairs            Wheelchair Mobility    Modified Rankin (Stroke Patients Only)       Balance Overall balance assessment: Needs assistance         Standing balance support: Single extremity supported Standing balance-Leahy Scale: Fair                               Pertinent Vitals/Pain Pain Assessment: No/denies pain    Home Living Family/patient expects to be discharged to:: Private residence Living Arrangements: Spouse/significant other;Other relatives (granddaughters) Available Help at Discharge: Family;Available 24 hours/day Type of Home: House Home Access: Stairs to enter Entrance Stairs-Rails: Doctor, general practice of Steps: 3 Home Layout: One level Home Equipment: None      Prior Function Level of Independence: Independent         Comments: Does not drive     Hand Dominance        Extremity/Trunk Assessment   Upper Extremity Assessment: Overall WFL for tasks assessed           Lower Extremity Assessment: Generalized weakness      Cervical / Trunk Assessment: Normal  Communication   Communication: No difficulties  Cognition Arousal/Alertness: Awake/alert Behavior During Therapy: WFL for tasks assessed/performed Overall Cognitive Status: Within Functional Limits for tasks assessed                      General Comments      Exercises  Assessment/Plan    PT Assessment Patient needs continued PT services  PT Diagnosis Difficulty walking;Abnormality of gait;Generalized weakness   PT Problem List Decreased strength;Decreased activity tolerance;Decreased balance;Decreased mobility;Decreased knowledge of use of DME;Cardiopulmonary status limiting activity  PT Treatment Interventions DME instruction;Gait training;Stair training;Functional mobility  training;Therapeutic activities;Therapeutic exercise;Patient/family education   PT Goals (Current goals can be found in the Care Plan section) Acute Rehab PT Goals Patient Stated Goal: To get stronger PT Goal Formulation: With patient Time For Goal Achievement: 01/12/15 Potential to Achieve Goals: Good    Frequency Min 3X/week   Barriers to discharge        Co-evaluation               End of Session Equipment Utilized During Treatment: Gait belt;Oxygen Activity Tolerance: Patient limited by fatigue Patient left: in chair;with call bell/phone within reach Nurse Communication: Mobility status (Loose BM in bathroom)         Time: [id number] PT Time Calculation (min) (ACUTE ONLY): 23 min   Charges:   PT Evaluation $Initial PT Evaluation Tier I: 1 Procedure PT Treatments $Gait Training: 8-22 mins   PT G CodesVena Austria:        Lynell Kussman H 01/05/2015, 2:56 PM Durenda HurtSusan H. Renaldo Fiddleravis, PT, [facility] RanchMBA Acute Rehab Services Pager [phone]

## 2015-01-05 NOTE — Progress Notes (Addendum)
Pt assessed as ordered. HHN frequency changed to QID as the following criteria were noted: BS clear, resp pattern normal, Pt alert and oriented, No home resp medications, CXR shows chronic changes, O2 SAT is 100% on 3 lpm.

## 2015-01-05 NOTE — Progress Notes (Signed)
[facility] PROGRESS NOTE  Sheri [name] ZOX:[id number] DOB: 11/03/1925 DOA: 01/04/2015 PCP: No primary care provider on file.  Assessment/Plan: 1-Acute hypoxic Respiratory Failure; in setting of COPD exacerbation.  Continue with nebulizer treatment, Levaquin.  Continue with IV solumedrol.  Follow Blood culture.   2-Acute COPD exacerbation;  continue with nebulizer and solumedrol.   3-EKG abnormalities; Right Axis deviation:  troponin negative.  ECHO ordered.   HTN (hypertension) -Continue with Norvasc.   AKI; GFR lower than 7 years ago. Unclear baseline. NSL fluids and monitor.   Former tobacco use -Patient stopped tobacco 3 years prior  Anemia -Has become slightly more progressive since 2012 when she underwent colonoscopy which showed no evidence of polyp or mass -anemia panel consistent with iron deficiency. Will start iron supplement. TSH normal.    Code Status: full code.  Family Communication: Care discussed with patient.  Disposition Plan: Remain in the step down unit.    Consultants:  none  Procedures:  ECHO pending  Antibiotics:  Levaquin 3-5  HPI/Subjective: She is feeling better, coughing phlegm.  No chest pain. No BM since Friday.   Objective: Filed Vitals:   01/05/15 0327  BP: 147/68  Pulse: 86  Temp: 98.4 F (36.9 C)  Resp: 24    Intake/Output Summary (Last 24 hours) at 01/05/15 0750 Last data filed at 01/05/15 0700  Gross per 24 hour  Intake    895 ml  Output    650 ml  Net    245 ml   Filed Weights   01/04/15 1825 01/05/15 0327  Weight: 62.007 kg (136 lb 11.2 oz) 62.7 kg (138 lb 3.7 oz)    Exam:   General:  Alert in no distress.   Cardiovascular: S 1, S 2 RRR  Respiratory: Bilateral expiratory wheezing, diffuse. No increase work of breathing.   Abdomen: Bs present, oft, nt  Musculoskeletal: no edema.   Data Reviewed: Basic Metabolic Panel:  Recent Labs Lab 01/04/15 1143 01/05/15 0250  NA 139 139  K  3.7 4.8  CL 105 106  CO2 24 26  GLUCOSE 155* 132*  BUN 11 14  CREATININE 0.89 1.04  CALCIUM 8.3* 8.6   Liver Function Tests:  Recent Labs Lab 01/05/15 0250  AST 19  ALT 9  ALKPHOS 55  BILITOT 0.5  PROT 6.5  ALBUMIN 2.9*   No results for input(s): LIPASE, AMYLASE in the last 168 hours. No results for input(s): AMMONIA in the last 168 hours. CBC:  Recent Labs Lab 01/04/15 1143 01/05/15 0250  WBC 10.1 6.7  HGB 10.6* 9.2*  HCT 34.2* 30.1*  MCV 78.4 78.2  PLT 280 267   Cardiac Enzymes: No results for input(s): CKTOTAL, CKMB, CKMBINDEX, TROPONINI in the last 168 hours. BNP (last 3 results) No results for input(s): BNP in the last 8760 hours.  ProBNP (last 3 results) No results for input(s): PROBNP in the last 8760 hours.  CBG: No results for input(s): GLUCAP in the last 168 hours.  Recent Results (from the past 240 hour(s))  Blood culture (routine x 2)     Status: None (Preliminary result)   Collection Time: 01/04/15  3:20 PM  Result Value Ref Range Status   Specimen Description BLOOD RIGHT ARM  Final   Special Requests BOTTLES DRAWN AEROBIC AND ANAEROBIC 10 CC  Final   Culture   Final           BLOOD CULTURE RECEIVED NO GROWTH TO DATE CULTURE WILL BE HELD FOR 5 DAYS BEFORE  ISSUING [name] FINAL NEGATIVE REPORT [facility]    Report Status PENDING  Incomplete  Blood culture (routine x 2)     Status: None (Preliminary result)   Collection Time: 01/04/15  3:27 PM  Result Value Ref Range Status   Specimen Description BLOOD RIGHT HAND  Final   Special Requests BOTTLES DRAWN AEROBIC ONLY 9 CC  Final   Culture   Final           BLOOD CULTURE RECEIVED NO GROWTH TO DATE CULTURE WILL BE HELD FOR 5 DAYS BEFORE ISSUING [name] FINAL NEGATIVE REPORT Performed at [facility]    Report Status PENDING  Incomplete  MRSA PCR Screening     Status: None   Collection Time: 01/04/15  5:15 PM  Result Value Ref Range Status   MRSA by PCR NEGATIVE NEGATIVE  Final    Comment:        The GeneXpert MRSA Assay (FDA approved for NASAL specimens only), is one component of [name] comprehensive MRSA colonization surveillance program. It is not intended to diagnose MRSA infection nor to guide or monitor treatment for MRSA infections.      Studies: Dg Chest Portable 1 View  01/04/2015   CLINICAL DATA:  Short of breath with chest tightness. History of bronchitis.  EXAM: PORTABLE CHEST - 1 VIEW  COMPARISON:  Of 04/18/2013  FINDINGS: Numerous leads and wires project over the chest. Midline trachea. Normal heart size with advanced atherosclerosis in the transverse aorta. No pleural effusion or pneumothorax. Right paratracheal soft tissue fullness is similar and likely due to great vessel shadow. No pleural effusion or pneumothorax. Hyperinflation. Diffuse peribronchial thickening.  IMPRESSION: COPD/chronic bronchitis.  No acute superimposed process.  Advanced for age aortic atherosclerosis.   Electronically Signed   By: Jeronimo GreavesKyle  Talbot M.D.   On: 01/04/2015 12:57    Scheduled Meds: . amLODipine  5 mg Oral Daily  . antiseptic oral rinse  7 mL Mouth Rinse BID  . etodolac  400 mg Oral BID  . heparin  5,000 Units Subcutaneous 3 times per day  . ipratropium-albuterol  3 mL Nebulization QID  . [START ON 01/06/2015] levofloxacin (LEVAQUIN) IV  750 mg Intravenous Q48H  . methylPREDNISolone (SOLU-MEDROL) injection  60 mg Intravenous Q6H  . sodium chloride  3 mL Intravenous Q12H  . sodium chloride  3 mL Intravenous Q12H   Continuous Infusions: . sodium chloride 75 mL/hr at 01/04/15 2041    Active Problems:   COPD exacerbation   Acute respiratory failure with hypoxia and hypercarbia   Former tobacco use   HTN (hypertension)   Acute renal failure   Anemia    Time spent: 35 minutes.     Sheri [name]  [facility] Pager [phone]. If 7PM-7AM, please contact night-coverage at [email or web address], password [email or web address] 01/05/2015, 7:50 AM  LOS: 1 day

## 2015-01-06 ENCOUNTER — Encounter (HOSPITAL_COMMUNITY): Payer: Self-pay | Admitting: *Deleted

## 2015-01-06 LAB — BASIC METABOLIC PANEL
Anion gap: 4 — ABNORMAL LOW (ref 5–15)
BUN: 19 mg/dL (ref 6–23)
CO2: 25 mmol/L (ref 19–32)
Calcium: 8.3 mg/dL — ABNORMAL LOW (ref 8.4–10.5)
Chloride: 110 mmol/L (ref 96–112)
Creatinine, Ser: 0.85 mg/dL (ref 0.50–1.10)
GFR calc Af Amer: 70 mL/min — ABNORMAL LOW (ref >90)
GFR calc non Af Amer: 60 mL/min — ABNORMAL LOW (ref >90)
Glucose, Bld: 158 mg/dL — ABNORMAL HIGH (ref 70–99)
Potassium: 4.4 mmol/L (ref 3.5–5.1)
Sodium: 139 mmol/L (ref 135–145)

## 2015-01-06 MED ORDER — METHYLPREDNISOLONE SODIUM SUCC 125 MG IJ SOLR
60.0000 mg | Freq: Two times a day (BID) | INTRAMUSCULAR | Status: DC
  Administered 2015-01-06 – 2015-01-08 (×4): 60 mg via INTRAVENOUS
  Filled 2015-01-06 (×4): qty 2

## 2015-01-06 NOTE — Care Management Note (Addendum)
    Page 1 of 2   01/08/2015     8:47:21 AM CARE MANAGEMENT NOTE 01/08/2015  Patient:  Sheri [name],Sheri [name]   Account Number:  [id number]  Date Initiated:  01/06/2015  Documentation initiated by:  Junius CreamerWELL,DEBBIE  Subjective/Objective Assessment:   adm w copd     Action/Plan:   lives w husband   Anticipated DC Date:  01/08/2015   Anticipated DC Plan:  HOME W HOME HEALTH SERVICES      DC Planning Services  CM consult      [facility]  DURABLE MEDICAL EQUIPMENT   Choice offered to / List presented to:  C-1 Patient   DME arranged  WALKER - ROLLING  NEBULIZER MACHINE  OXYGEN      DME agency  [facility]     HH arranged  HH-1 RN  HH-2 PT      [facility]   Status of service:  Completed, signed off Medicare Important Message given?  YES (If response is "NO", the following Medicare IM given date fields will be blank) Date Medicare IM given:  01/07/2015 Medicare IM given by:  Ronny FlurryWILE,Ilija Maxim Date Additional Medicare IM given:   Additional Medicare IM given by:    Discharge Disposition:  HOME W HOME HEALTH SERVICES  Per UR Regulation:  Reviewed for med. necessity/level of care/duration of stay  If discussed at Long Length of Stay Meetings, dates discussed:    Comments:  3/7 1051a debbie dowell rn,bsn spoke w pt and went over hhc agency list. no pref. ref to debbie w [facility] for hhrn and hhpt. pt's ins contracted w [facility] for rolling walker.

## 2015-01-06 NOTE — Progress Notes (Signed)
Report called to Ladona Ridgelaylor, RN on 6N. Pt's VSS, no c/o pain, alert and oriented at baseline. All personal belongings with pt. CCMD and Elink notified of transfer, telemetry removed.

## 2015-01-06 NOTE — Progress Notes (Signed)
SATURATION QUALIFICATIONS: (This note is used to comply with regulatory documentation for home oxygen)  Patient Saturations on Room Air at Rest = 94%  Patient Saturations on Room Air while Ambulating = 87%  Patient Saturations on 2 Liters of oxygen while Ambulating = 92% Please briefly explain why patient needs home oxygen: Pt with decr SaO2 with activity.  Fluor CorporationCary Wrigley Winborne PT [phone]

## 2015-01-06 NOTE — Evaluation (Signed)
Occupational Therapy Evaluation Patient Details Name: Sheri [name] MRN: [id number] DOB: February 22, 1926 Today's Date: 01/06/2015    History of Present Illness Patient is an 81 yo female admitted 01/04/15 with COPD exacerbation, hypoxia, HTN.  PMH:  COPD, HTN   Clinical Impression   Pt was independent in self care prior to admission, but reports decreased balance.  Pt presents with unsteady gait and poor activity tolerance interfering with ability to perform self care and ADL transfers.  Will follow acutely.   Follow Up Recommendations  Home health OT    Equipment Recommendations   (tub equipment TBD), 4 wheel walker   Recommendations for Other Services       Precautions / Restrictions Precautions Precautions: Fall Precaution Comments: Patient reports "almost falling" several times prior to admit due to decreased balance. Restrictions Weight Bearing Restrictions: No      Mobility Bed Mobility Overal bed mobility: Modified Independent Bed Mobility: Sit to Supine     Supine to sit: Modified independent (Device/Increase time) Sit to supine: Modified independent (Device/Increase time)      Transfers Overall transfer level: Modified independent Equipment used: 1 person hand held assist Transfers: Sit to/from Stand Sit to Stand: Supervision              Balance Overall balance assessment: Needs assistance Sitting-balance support: No upper extremity supported;Feet supported Sitting balance-Leahy Scale: Normal     Standing balance support: No upper extremity supported Standing balance-Leahy Scale: Fair                              ADL Overall ADL's : Needs assistance/impaired Eating/Feeding: Independent;Sitting   Grooming: Standing;Supervision/safety   Upper Body Bathing: Set up;Sitting   Lower Body Bathing: Sit to/from stand;Supervison/ safety   Upper Body Dressing : Set up;Sitting   Lower Body Dressing: Supervision/safety;Sit to/from stand    Toilet Transfer: Minimal assistance;Ambulation (one hand held)   Toileting- Architect and Hygiene: Supervision/safety;Sit to/from stand       Functional mobility during ADLs: Minimal assistance General ADL Comments: Pt gets down in the tub to bathe, does not like to shower.     Vision     Perception     Praxis      Pertinent Vitals/Pain Pain Assessment: No/denies pain     Hand Dominance Right   Extremity/Trunk Assessment Upper Extremity Assessment Upper Extremity Assessment: Overall WFL for tasks assessed   Lower Extremity Assessment Lower Extremity Assessment: Defer to PT evaluation   Cervical / Trunk Assessment Cervical / Trunk Assessment: Normal   Communication Communication Communication: No difficulties   Cognition Arousal/Alertness: Awake/alert Behavior During Therapy: WFL for tasks assessed/performed Overall Cognitive Status: Within Functional Limits for tasks assessed                     General Comments       Exercises       Shoulder Instructions      Home Living Family/patient expects to be discharged to:: Private residence Living Arrangements: Spouse/significant other;Other relatives (granddaughters) Available Help at Discharge: Family;Available 24 hours/day Type of Home: House Home Access: Stairs to enter [employer] of Steps: 3 Entrance Stairs-Rails: Right;Left Home Layout: One level     Bathroom Shower/Tub: Chief Strategy Officer: Standard     Home Equipment: None          Prior Functioning/Environment Level of Independence: Independent        Comments:  Does not drive    OT Diagnosis: Generalized weakness   OT Problem List: Decreased activity tolerance;Impaired balance (sitting and/or standing);Decreased knowledge of use of DME or AE;Cardiopulmonary status limiting activity   OT Treatment/Interventions: Self-care/ADL training;DME and/or AE instruction;Patient/family  education;Balance training    OT Goals(Current goals can be found in the care plan section) Acute Rehab OT Goals Patient Stated Goal: To get stronger OT Goal Formulation: With patient Time For Goal Achievement: 01/13/15 Potential to Achieve Goals: Good ADL Goals Pt Will Perform Grooming: with modified independence;standing Pt Will Perform Lower Body Bathing: with modified independence;sit to/from stand Pt Will Perform Lower Body Dressing: with modified independence;sit to/from stand Pt Will Transfer to Toilet: with modified independence;ambulating;regular height toilet Pt Will Perform Toileting - Clothing Manipulation and hygiene: with modified independence;sit to/from stand Pt Will Perform Tub/Shower Transfer: Tub transfer;with modified independence;ambulating (pt prefers tub bathing, does not like to shower) Additional ADL Goal #1: Pt will state at least 3 energy conservation strategies.  OT Frequency: Min 2X/week   Barriers to D/C:            Co-evaluation              End of Session Equipment Utilized During Treatment: Gait belt;Oxygen  Activity Tolerance: Patient limited by fatigue Patient left: in bed;with call bell/phone within reach;with nursing/sitter in room   Time: 1353-1420 OT Time Calculation (min): 27 min Charges:  OT General Charges $OT Visit: 1 Procedure OT Evaluation $Initial OT Evaluation Tier I: 1 Procedure OT Treatments $Self Care/Home Management : 8-22 mins G-Codes:    Evern BioMayberry, Rhea Thrun Lynn 01/06/2015, 2:24 PM  [phone]

## 2015-01-06 NOTE — Progress Notes (Signed)
Physical Therapy Treatment Patient Details Name: Sheri [name] MRN: [id number] DOB: 1926/02/12 Today's Date: 01/06/2015    History of Present Illness Patient is an 81 yo female admitted 01/04/15 with COPD exacerbation, hypoxia, HTN.  PMH:  COPD, HTN    PT Comments    Pt doing very well. Will need rollator for home. Also with decr SaO2 with amb on RA.  Follow Up Recommendations  Home health PT;Supervision - Intermittent     Equipment Recommendations  Other (comment) (rollator)    Recommendations for Other Services       Precautions / Restrictions Precautions Precautions: Fall    Mobility  Bed Mobility Overal bed mobility: Modified Independent Bed Mobility: Supine to Sit     Supine to sit: Modified independent (Device/Increase time)        Transfers Overall transfer level: Modified independent Equipment used: 4-wheeled walker Transfers: Sit to/from Stand Sit to Stand: Modified independent (Device/Increase time)            Ambulation/Gait Ambulation/Gait assistance: Supervision Ambulation Distance (Feet): 175 Feet Assistive device: 4-wheeled walker Gait Pattern/deviations: Step-through pattern;Decreased stride length   Gait velocity interpretation: at or above normal speed for age/gender General Gait Details: Pt able to amb with rollator with dyspnea 3/4 on RA with SaO2 87% on RA.   Stairs            Wheelchair Mobility    Modified Rankin (Stroke Patients Only)       Balance Overall balance assessment: Needs assistance Sitting-balance support: No upper extremity supported;Feet supported Sitting balance-Leahy Scale: Normal     Standing balance support: No upper extremity supported Standing balance-Leahy Scale: Fair                      Cognition Arousal/Alertness: Awake/alert Behavior During Therapy: WFL for tasks assessed/performed Overall Cognitive Status: Within Functional Limits for tasks assessed                       Exercises      General Comments        Pertinent Vitals/Pain Pain Assessment: No/denies pain    Home Living                      Prior Function            PT Goals (current goals can now be found in the care plan section) Progress towards PT goals: Progressing toward goals    Frequency  Min 3X/week    PT Plan Current plan remains appropriate    Co-evaluation             End of Session   Activity Tolerance: Patient tolerated treatment well Patient left: in chair;with call bell/phone within reach     Time: 1213-1230 PT Time Calculation (min) (ACUTE ONLY): 17 min  Charges:  $Gait Training: 8-22 mins                    G Codes:      Sheri [name] 01/06/2015, 12:54 PM  Flu[employer] CorporationCary Ilyas [name] PT [phone]

## 2015-01-06 NOTE — Progress Notes (Signed)
[facility] PROGRESS NOTE  Sheri [name] ZOX:[id number] DOB: Dec 21, 1925 DOA: 01/04/2015 PCP: No primary care provider on file.  Assessment/Plan: 1-Acute hypoxic Respiratory Failure; in setting of COPD exacerbation.  Continue with nebulizer treatment, Levaquin.  Continue with IV solumedrol, change to BID.  Follow Blood culture no growth to date.  Improving, transfer to med-surgery today.  ECHO; mildly elevated pulmonary pressure.   2-Acute COPD exacerbation;  continue with nebulizer and solumedrol. Levaquin.   3-EKG abnormalities; Right Axis deviation:  troponin negative.  ECHO normal Ef, mild pulmonary HTN.   HTN (hypertension) -Continue with Norvasc.   AKI; GFR lower than 7 years ago. Unclear baseline. NSL fluids and monitor.   Former tobacco use -Patient stopped tobacco 3 years prior  Anemia -Has become slightly more progressive since 2012 when she underwent colonoscopy which showed no evidence of polyp or mass -anemia panel consistent with iron deficiency.  Started  iron supplement. TSH normal.    Code Status: full code.  Family Communication: Care discussed with patient.  Disposition Plan: transferred-surgery. PT, OT evaluation.    Consultants:  none  Procedures:  ECHO: Normal LV function, grade 1 diastolic dysfunction; trace MR and AI; mildly elevated pulmonary pressure.  Antibiotics:  Levaquin 3-5  HPI/Subjective: Feeling better,  breathing better.  Had BM yesterday. Mild abdominal soreness when she cough.   Objective: Filed Vitals:   01/06/15 0735  BP: 166/75  Pulse: 81  Temp: 98 F (36.7 C)  Resp:     Intake/Output Summary (Last 24 hours) at 01/06/15 0932 Last data filed at 01/06/15 0500  Gross per 24 hour  Intake    240 ml  Output    750 ml  Net   -510 ml   Filed Weights   01/04/15 1825 01/05/15 0327 01/06/15 0406  Weight: 62.007 kg (136 lb 11.2 oz) 62.7 kg (138 lb 3.7 oz) 62.3 kg (137 lb 5.6 oz)    Exam:   General:   Alert in no distress.   Cardiovascular: S 1, S 2 RRR  Respiratory; no wheezing, bilateral ronchus. . No increase work of breathing.   Abdomen: Bs present, oft, nt  Musculoskeletal: no edema.   Data Reviewed: Basic Metabolic Panel:  Recent Labs Lab 01/04/15 1143 01/05/15 0250 01/06/15 0340  NA 139 139 139  K 3.7 4.8 4.4  CL 105 106 110  CO2 GLUCOSE 155* 132* 158*  BUN CREATININE 0.89 1.04 0.85  CALCIUM 8.3* 8.6 8.3*   Liver Function Tests:  Recent Labs Lab 01/05/15 0250  AST 19  ALT 9  ALKPHOS 55  BILITOT 0.5  PROT 6.5  ALBUMIN 2.9*   No results for input(s): LIPASE, AMYLASE in the last 168 hours. No results for input(s): AMMONIA in the last 168 hours. CBC:  Recent Labs Lab 01/04/15 1143 01/05/15 0250  WBC 10.1 6.7  HGB 10.6* 9.2*  HCT 34.2* 30.1*  MCV 78.4 78.2  PLT 280 267   Cardiac Enzymes: No results for input(s): CKTOTAL, CKMB, CKMBINDEX, TROPONINI in the last 168 hours. BNP (last 3 results) No results for input(s): BNP in the last 8760 hours.  ProBNP (last 3 results) No results for input(s): PROBNP in the last 8760 hours.  CBG: No results for input(s): GLUCAP in the last 168 hours.  Recent Results (from the past 240 hour(s))  Blood culture (routine x 2)     Status: None (Preliminary result)   Collection Time: 01/04/15  3:20 PM  Result Value  Ref Range Status   Specimen Description BLOOD RIGHT ARM  Final   Special Requests BOTTLES DRAWN AEROBIC AND ANAEROBIC 10 CC  Final   Culture   Final           BLOOD CULTURE RECEIVED NO GROWTH TO DATE CULTURE WILL BE HELD FOR 5 DAYS BEFORE ISSUING A FINAL NEGATIVE REPORT Performed at [facility]    Report Status PENDING  Incomplete  Blood culture (routine x 2)     Status: None (Preliminary result)   Collection Time: 01/04/15  3:27 PM  Result Value Ref Range Status   Specimen Description BLOOD RIGHT HAND  Final   Special Requests BOTTLES DRAWN AEROBIC ONLY 9 CC  Final    Culture   Final           BLOOD CULTURE RECEIVED NO GROWTH TO DATE CULTURE WILL BE HELD FOR 5 DAYS BEFORE ISSUING A FINAL NEGATIVE REPORT Performed at [facility]    Report Status PENDING  Incomplete  MRSA PCR Screening     Status: None   Collection Time: 01/04/15  5:15 PM  Result Value Ref Range Status   MRSA by PCR NEGATIVE NEGATIVE Final    Comment:        The GeneXpert MRSA Assay (FDA approved for NASAL specimens only), is one component of a comprehensive MRSA colonization surveillance program. It is not intended to diagnose MRSA infection nor to guide or monitor treatment for MRSA infections.      Studies: Dg Chest Portable 1 View  01/04/2015   CLINICAL DATA:  Short of breath with chest tightness. History of bronchitis.  EXAM: PORTABLE CHEST - 1 VIEW  COMPARISON:  Of 04/18/2013  FINDINGS: Numerous leads and wires project over the chest. Midline trachea. Normal heart size with advanced atherosclerosis in the transverse aorta. No pleural effusion or pneumothorax. Right paratracheal soft tissue fullness is similar and likely due to great vessel shadow. No pleural effusion or pneumothorax. Hyperinflation. Diffuse peribronchial thickening.  IMPRESSION: COPD/chronic bronchitis.  No acute superimposed process.  Advanced for age aortic atherosclerosis.   Electronically Signed   By: Jeronimo GreavesKyle  Talbot M.D.   On: 01/04/2015 12:57    Scheduled Meds: . amLODipine  5 mg Oral Daily  . antiseptic oral rinse  7 mL Mouth Rinse BID  . etodolac  400 mg Oral BID  . ferrous sulfate  325 mg Oral BID WC  . heparin  5,000 Units Subcutaneous 3 times per day  . ipratropium-albuterol  3 mL Nebulization QID  . levofloxacin  250 mg Oral Daily  . methylPREDNISolone (SOLU-MEDROL) injection  60 mg Intravenous Q6H  . polyethylene glycol  17 g Oral BID  . sodium chloride  3 mL Intravenous Q12H  . sodium chloride  3 mL Intravenous Q12H   Continuous Infusions:    Active Problems:   COPD exacerbation    Acute respiratory failure with hypoxia and hypercarbia   Former tobacco use   HTN (hypertension)   Acute renal failure   Anemia    Time spent: 35 minutes.     [name], Brunilda Eble A  [facility] Pager [phone]. If 7PM-7AM, please contact night-coverage at [email or web address], password [email or web address] 01/06/2015, 9:32 AM  LOS: 2 days

## 2015-01-06 NOTE — Progress Notes (Signed)
INITIAL NUTRITION ASSESSMENT  DOCUMENTATION CODES Per approved criteria  -Not Applicable   INTERVENTION: -Provide nourishments in between meals (chocolate ice cream) to maximize nutritional intake  NUTRITION DIAGNOSIS: Inadequate oral related to early satiety as evidenced by PO: 25%.   Goal: Pt will meet >90% of estimated nutritional needs  Monitor:  PO/supplement intake, labs, weight changes, I/O's  Reason for Assessment: Consult to assess needs  81 y.o. female  Admitting Dx: <principal problem not specified>  40 year old woman with history of COPD, tobacco dependence in remission resent with respiratory distress. She was treated with bronchodilators, steroids and BiPAP and admitted to the stepdown unit.  ASSESSMENT: Pt admitted with acute hypoxic respiratory failure and COPD exacerbation. Spoke with pt at bedside. She reports that her appetite "comes and goes" at baseline. She complains of early satiety, reporting "I can eat, just not a lot at one time". She shared with this RD at that potatoes and other high fiber foods fills her up a lot. She denies any chewing and swallowing issues. She reports she typically consumes 25-50% of her meals (which is confirmed by documented meal intake). She reports UBW of 145#, but unable to determine when she last weighed this amount. She reveals that her weight has fluctuated in the past several years, but usually ranges between the 140-145# range.  She reports that she is fairly active at baseline, but was not as active PTA due to dizziness when she bent over. Fat and muscle depletion on exam likely due to advanced age and decreased mobility.  Discussed importance of good meal intake to promote healing. Food Paramedic and this RD helped pt choose foods off the menu that she preferred. Pt declines offer of supplements, but is agreeable to between meal nourishments.  Labs reviewed. Calcium: 8.3, Glucose: 158.   Nutrition Focused Physical  Exam:  Subcutaneous Fat:  Orbital Region: mild depletion Upper Arm Region: WDL Thoracic and Lumbar Region: WDL  Muscle:  Temple Region: mild depletion Clavicle Bone Region: WDL Clavicle and Acromion Bone Region: WDL Scapular Bone Region: WDL Dorsal Hand: WDL Patellar Region: mild depletion Anterior Thigh Region: mild depletion Posterior Calf Region: mild depletion  Edema: none presnt   Height: Ht Readings from Last 1 Encounters:  01/04/15  (1.651 m)    Weight: Wt Readings from Last 1 Encounters:  01/06/15 137 lb 5.6 oz (62.3 kg)    Ideal Body Weight: 125#  % Ideal Body Weight: 110%  Wt Readings from Last 10 Encounters:  01/06/15 137 lb 5.6 oz (62.3 kg)    Usual Body Weight: 145#  % Usual Body Weight: 94%  BMI:  Body mass index is 22.86 kg/(m^2). Normal weight range  Estimated Nutritional Needs: Kcal: 1500-1700 Protein: 70-80 grams Fluid: 1.5-1.7 L  Skin: Intact  Diet Order: Diet regular  EDUCATION NEEDS: -Education needs addressed   Intake/Output Summary (Last 24 hours) at 01/06/15 0855 Last data filed at 01/06/15 0500  Gross per 24 hour  Intake    240 ml  Output    750 ml  Net   -510 ml    Last BM: PTA  Labs:   Recent Labs Lab 01/04/15 1143 01/05/15 0250 01/06/15 0340  NA 139 139 139  K 3.7 4.8 4.4  CL 105 106 110  CO2 BUN CREATININE 0.89 1.04 0.85  CALCIUM 8.3* 8.6 8.3*  GLUCOSE 155* 132* 158*    CBG (last 3)  No results for input(s): GLUCAP in  the last 72 hours.  Scheduled Meds: . amLODipine  5 mg Oral Daily  . antiseptic oral rinse  7 mL Mouth Rinse BID  . etodolac  400 mg Oral BID  . ferrous sulfate  325 mg Oral BID WC  . heparin  5,000 Units Subcutaneous 3 times per day  . ipratropium-albuterol  3 mL Nebulization QID  . levofloxacin  250 mg Oral Daily  . methylPREDNISolone (SOLU-MEDROL) injection  60 mg Intravenous Q6H  . polyethylene glycol  17 g Oral BID  . sodium chloride  3 mL  Intravenous Q12H  . sodium chloride  3 mL Intravenous Q12H    Continuous Infusions:   Past Medical History  Diagnosis Date  . Bronchitis     History reviewed. No pertinent past surgical history.  Cianni Manny A. Mayford KnifeWilliams, RD, LDN, CDE Pager: [phone] After hours Pager: [phone]

## 2015-01-07 MED ORDER — IPRATROPIUM-ALBUTEROL 0.5-2.5 (3) MG/3ML IN SOLN
3.0000 mL | RESPIRATORY_TRACT | Status: DC
  Administered 2015-01-07 – 2015-01-08 (×5): 3 mL via RESPIRATORY_TRACT
  Filled 2015-01-07 (×5): qty 3

## 2015-01-07 MED ORDER — IPRATROPIUM-ALBUTEROL 0.5-2.5 (3) MG/3ML IN SOLN
3.0000 mL | RESPIRATORY_TRACT | Status: DC | PRN
Start: 2015-01-07 — End: 2015-01-07
  Filled 2015-01-07: qty 3

## 2015-01-07 NOTE — Progress Notes (Signed)
[facility] PROGRESS NOTE  Sheri [name] ZOX:[id number] DOB: 09-21-1926 DOA: 01/04/2015 PCP: No primary care provider on file.  Assessment/Plan: 1-Acute hypoxic Respiratory Failure; in setting of COPD exacerbation.  Continue with nebulizer treatment, Levaquin.  Continue with IV solumedrol BID.  Follow Blood culture no growth to date.  ECHO; mildly elevated pulmonary pressure.  I have change back nebulizer treatments to Schedule. Patient with diffuse Wheezing.  Still with significant wheezing, will continue with IV solumedrol.   2-Acute COPD exacerbation;  continue with nebulizer and solumedrol. Levaquin.   3-EKG abnormalities; Right Axis deviation:  troponin negative.  ECHO normal Ef, mild pulmonary HTN.   HTN (hypertension) -Continue with Norvasc.   AKI; GFR lower than 7 years ago. Unclear baseline. NSL fluids and monitor.   Former tobacco use -Patient stopped tobacco 3 years prior  Anemia -Has become slightly more progressive since 2012 when she underwent colonoscopy which showed no evidence of polyp or mass -anemia panel consistent with iron deficiency.  Started  iron supplement. TSH normal.    Code Status: full code.  Family Communication: Care discussed with patient.  Disposition Plan: home 3-9 if respiratory status improved.    Consultants:  none  Procedures:  ECHO: Normal LV function, grade 1 diastolic dysfunction; trace MR and AI; mildly elevated pulmonary pressure.  Antibiotics:  Levaquin 3-5  HPI/Subjective: Feeling better,  breathing better. Not at baseline yet.   Objective: Filed Vitals:   01/07/15 0943  BP: 145/65  Pulse:   Temp:   Resp:     Intake/Output Summary (Last 24 hours) at 01/07/15 1458 Last data filed at 01/07/15 0918  Gross per 24 hour  Intake    240 ml  Output    525 ml  Net   -285 ml   Filed Weights   01/05/15 0327 01/06/15 0406 01/07/15 0500  Weight: 62.7 kg (138 lb 3.7 oz) 62.3 kg (137 lb 5.6 oz) 62.188 kg  (137 lb 1.6 oz)    Exam:   General:  Alert in no distress.   Cardiovascular: S 1, S 2 RRR  Respiratory; Bilateral diffuse wheezing.   Abdomen: Bs present, oft, nt  Musculoskeletal: no edema.   Data Reviewed: Basic Metabolic Panel:  Recent Labs Lab 01/04/15 1143 01/05/15 0250 01/06/15 0340  NA 139 139 139  K 3.7 4.8 4.4  CL 105 106 110  CO2 GLUCOSE 155* 132* 158*  BUN CREATININE 0.89 1.04 0.85  CALCIUM 8.3* 8.6 8.3*   Liver Function Tests:  Recent Labs Lab 01/05/15 0250  AST 19  ALT 9  ALKPHOS 55  BILITOT 0.5  PROT 6.5  ALBUMIN 2.9*   No results for input(s): LIPASE, AMYLASE in the last 168 hours. No results for input(s): AMMONIA in the last 168 hours. CBC:  Recent Labs Lab 01/04/15 1143 01/05/15 0250  WBC 10.1 6.7  HGB 10.6* 9.2*  HCT 34.2* 30.1*  MCV 78.4 78.2  PLT 280 267   Cardiac Enzymes: No results for input(s): CKTOTAL, CKMB, CKMBINDEX, TROPONINI in the last 168 hours. BNP (last 3 results) No results for input(s): BNP in the last 8760 hours.  ProBNP (last 3 results) No results for input(s): PROBNP in the last 8760 hours.  CBG: No results for input(s): GLUCAP in the last 168 hours.  Recent Results (from the past 240 hour(s))  Blood culture (routine x 2)     Status: None (Preliminary result)   Collection Time: 01/04/15  3:20 PM  Result  Value Ref Range Status   Specimen Description BLOOD RIGHT ARM  Final   Special Requests BOTTLES DRAWN AEROBIC AND ANAEROBIC 10 CC  Final   Culture   Final           BLOOD CULTURE RECEIVED NO GROWTH TO DATE CULTURE WILL BE HELD FOR 5 DAYS BEFORE ISSUING [name] FINAL NEGATIVE REPORT Performed at [facility]    Report Status PENDING  Incomplete  Blood culture (routine x 2)     Status: None (Preliminary result)   Collection Time: 01/04/15  3:27 PM  Result Value Ref Range Status   Specimen Description BLOOD RIGHT HAND  Final   Special Requests BOTTLES DRAWN AEROBIC ONLY 9 CC   Final   Culture   Final           BLOOD CULTURE RECEIVED NO GROWTH TO DATE CULTURE WILL BE HELD FOR 5 DAYS BEFORE ISSUING [name] FINAL NEGATIVE REPORT Performed at [facility]    Report Status PENDING  Incomplete  MRSA PCR Screening     Status: None   Collection Time: 01/04/15  5:15 PM  Result Value Ref Range Status   MRSA by PCR NEGATIVE NEGATIVE Final    Comment:        The GeneXpert MRSA Assay (FDA approved for NASAL specimens only), is one component of [name] comprehensive MRSA colonization surveillance program. It is not intended to diagnose MRSA infection nor to guide or monitor treatment for MRSA infections.      Studies: No results found.  Scheduled Meds: . amLODipine  5 mg Oral Daily  . antiseptic oral rinse  7 mL Mouth Rinse BID  . etodolac  400 mg Oral BID  . ferrous sulfate  325 mg Oral BID WC  . heparin  5,000 Units Subcutaneous 3 times per day  . ipratropium-albuterol  3 mL Nebulization Q4H  . levofloxacin  250 mg Oral Daily  . methylPREDNISolone (SOLU-MEDROL) injection  60 mg Intravenous Q12H  . polyethylene glycol  17 g Oral BID  . sodium chloride  3 mL Intravenous Q12H  . sodium chloride  3 mL Intravenous Q12H   Continuous Infusions:    Active Problems:   COPD exacerbation   Acute respiratory failure with hypoxia and hypercarbia   Former tobacco use   HTN (hypertension)   Acute renal failure   Anemia    Time spent: 35 minutes.     [name], Sheri [name]  [facility] Pager [phone]. If 7PM-7AM, please contact night-coverage at [email or web address], password [email or web address] 01/07/2015, 2:58 PM  LOS: 3 days

## 2015-01-07 NOTE — Progress Notes (Signed)
Pt states she takes MDIs on a PRN basis only at home.  Pt changed to her home regimen.

## 2015-01-07 NOTE — Care Management (Signed)
An Important Message From Medicare given to patient. Alonzo Loving RN BSN  

## 2015-01-07 NOTE — Progress Notes (Signed)
Physical Therapy Treatment Patient Details Name: Sheri [name] MRN: [id number] DOB: [birth year] Today's Date: 01/07/2015    History of Present Illness Patient is an 81 yo female admitted 01/04/15 with COPD exacerbation, hypoxia, HTN.  PMH:  COPD, HTN    PT Comments    Patient is progressing well towards physical therapy goals, ambulating up to 200 feet with supervision. Safely complete stair training today and tolerates therapeutic exercises well. Required 2L supplemental O2 to maintain SpO2 >92% while ambulating. At rest SpO2 ranged 91-97% on 1L supplemental O2. Pt does mention mild chest tightness but denies exacerbation of symptoms upon exertion. Feel she is adequate for d/c from a mobility standpoint when medically ready.     Follow Up Recommendations  Home health PT;Supervision - Intermittent     Equipment Recommendations  Other (comment) (ROLLATOR)    Recommendations for Other Services       Precautions / Restrictions Precautions Precautions: Fall Restrictions Weight Bearing Restrictions: No    Mobility  Bed Mobility Overal bed mobility: Modified Independent                Transfers Overall transfer level: Needs assistance Equipment used: Rolling walker (2 wheeled) Transfers: Sit to/from Stand Sit to Stand: Supervision         General transfer comment: Supervision for safety with cues for hand placement. No physical assist needed. Performed from lowest bed setting and recliner x2.  Ambulation/Gait Ambulation/Gait assistance: Supervision Ambulation Distance (Feet): 200 Feet Assistive device: Rolling walker (2 wheeled) Gait Pattern/deviations: Step-through pattern;Decreased stride length;Narrow base of support;Trunk flexed Gait velocity: Decreased   General Gait Details: Supervision for safety. Cues for upright posture, forward gaze, and to widen step width. SpO2 94% on 2L supplemental O2, 85% on 1L supplemental O2 while ambulating. HR  118.   Stairs Stairs: Yes Stairs assistance: Supervision Stair Management: One rail Right;Step to pattern;Sideways Number of Stairs: 3 General stair comments: Educated on safe stair navigation technique with cues for sequencing and supervision for safety. Did not require physical assist.  Wheelchair Mobility    Modified Rankin (Stroke Patients Only)       Balance                                    Cognition Arousal/Alertness: Awake/alert Behavior During Therapy: WFL for tasks assessed/performed Overall Cognitive Status: Within Functional Limits for tasks assessed                      Exercises General Exercises - Lower Extremity Ankle Circles/Pumps: AROM;Both;10 reps;Seated Long Arc Quad: Strengthening;Both;10 reps;Seated Hip Flexion/Marching: Strengthening;Both;10 reps;Seated    General Comments General comments (skin integrity, edema, etc.): Resting HR 101, SpO2 91% & 97% on 1L supplemental O2 pre and post ambulating respectively. While ambulating SpO2 85% on 1L, 94% on 2L supplemental O2 with HR 118.      Pertinent Vitals/Pain Pain Assessment: 0-10 Pain Score:  ("chest tightness" not worse with exertion) Pain Location: chest Pain Descriptors / Indicators: Tightness Pain Intervention(s): Monitored during session (Not worse with exertion, pt with cough)    Home Living                      Prior Function            PT Goals (current goals can now be found in the care plan section) Acute Rehab PT Goals PT Goal Formulation:  With patient Time For Goal Achievement: 01/12/15 Potential to Achieve Goals: Good Progress towards PT goals: Progressing toward goals    Frequency  Min 3X/week    PT Plan Current plan remains appropriate    Co-evaluation             End of Session Equipment Utilized During Treatment: Gait belt;Oxygen Activity Tolerance: Patient tolerated treatment well Patient left: in chair;with call bell/phone  within reach     Time: 0836-0902 PT Time Calculation (min) (ACUTE ONLY): 26 min  Charges:  $Gait Training: 8-22 mins $Therapeutic Activity: 8-22 mins                    G Codes:      Berton MountBarbour, Dajahnae Vondra S 01/07/2015, 9:56 AM Charlsie MerlesLogan Secor Blondie Riggsbee, [place]PT [phone]

## 2015-01-08 MED ORDER — IPRATROPIUM-ALBUTEROL 0.5-2.5 (3) MG/3ML IN SOLN: 3.0000 mL | RESPIRATORY_TRACT | Status: DC | PRN

## 2015-01-08 MED ORDER — FERROUS SULFATE 325 (65 FE) MG PO TABS: 325.0000 mg | ORAL_TABLET | Freq: Two times a day (BID) | ORAL | Status: DC

## 2015-01-08 MED ORDER — LEVOFLOXACIN 250 MG PO TABS: 250.0000 mg | ORAL_TABLET | Freq: Every day | ORAL | Status: DC

## 2015-01-08 MED ORDER — BENZONATATE 200 MG PO CAPS: 200.0000 mg | ORAL_CAPSULE | Freq: Three times a day (TID) | ORAL | Status: DC | PRN

## 2015-01-08 MED ORDER — IPRATROPIUM-ALBUTEROL 0.5-2.5 (3) MG/3ML IN SOLN: 3.0000 mL | RESPIRATORY_TRACT | Status: DC

## 2015-01-08 MED ORDER — DEXAMETHASONE 4 MG PO TABS: ORAL_TABLET | ORAL | Status: DC

## 2015-01-08 NOTE — Progress Notes (Signed)
Wasted morphine 2mg  IV in sharps container with Aggie Cosierheresa, Charity fundraiserN.  Lost pt's IV access.  Unable to restart IV.  IV team consulted per policy.  Eulonda Andalon, [facility]byterianMelissa Hope

## 2015-01-08 NOTE — Discharge Summary (Signed)
Physician Discharge Summary  Domingo Pulsehelma H Forlenza ZOX:[id number] DOB: May 31, 1926 DOA: 01/04/2015  PCP: No primary care provider on file.  Admit date: 01/04/2015 Discharge date: 01/08/2015  Time spent: 35 minutes  Recommendations for Outpatient Follow-up:  Follow up improvement of COPD.  Assess need of Oxygen.  Further work up with anemia  Discharge Diagnoses:    Acute COPD exacerbation   Acute respiratory failure with hypoxia and hypercarbia   Former tobacco use   HTN (hypertension)   Acute renal failure   Anemia   Discharge Condition: Stable.   Diet recommendation: Heart Healthy  Filed Weights   01/06/15 0406 01/07/15 0500 01/08/15 0543  Weight: 62.3 kg (137 lb 5.6 oz) 62.188 kg (137 lb 1.6 oz) 61.326 kg (135 lb 3.2 oz)    History of present illness:  Sheri [name] is a 81 y.o. female, former tobacco user, quit 3 years ago, known COPD who presented to the ER with acute onset of shortness of breath. Patient reports upper respiratory infection type symptoms for several days with productive cough and clear sputum with significant worsening of symptoms over the past 24 hours. Upon presentation to the ER she had diffuse tight wheezing which required steroids and BiPAP and multiple neb treatments. She improved somewhat but needed to remain on BiPAP.  In the ER her ABG revealed as but her acidosis with pH 7.31 and PCO2 48.7. She had some mild acute renal failure with a BUN 11 and creatinine 0.9 but with GFR slightly lower than her baseline. Her glucose was mildly elevated at 155 after the steroids. Her anion gap was normal. Her troponin was 0.00. She also had a mild anemia hemoglobin 10.6. Her chest x-ray was consistent with COPD and chronic bronchitis without any focal infiltrate. Her EKG demonstrated sinus tachycardia with Right atrial enlargement and Rightward axis system with a Pulmonary disease pattern.  Upon my examination of the patient patient was resting comfortably on BiPAP although she  developed more rapid respiratory rate and increased effort when attempting to answer questions. She confirms upper respiratory symptoms for several days, worse over the past 24 hours. She reported a clear productive cough and chills without definitive fevers.  Hospital Course:  1-Acute hypoxic Respiratory Failure; in setting of COPD exacerbation.  Continue with nebulizer treatment, Levaquin.  Follow Blood culture no growth to date.  ECHO; mildly elevated pulmonary pressure.  Patient feeling better, no wheezing.  Change IV solumedrol, to dexamethasone. Taper dose, then continue with home dose.   2-Acute COPD exacerbation;  continue with nebulizer and solumedrol. Levaquin.   3-EKG abnormalities; Right Axis deviation:  troponin negative.  ECHO normal Ef, mild pulmonary HTN.   HTN (hypertension) -Continue with Norvasc.   AKI; GFR lower than 7 years ago. Unclear baseline. NSL fluids and monitor.   Former tobacco use -Patient stopped tobacco 3 years prior  Anemia -Has become slightly more progressive since 2012 when she underwent colonoscopy which showed no evidence of polyp or mass -anemia panel consistent with iron deficiency. Started iron supplement. TSH normal.   Procedures:  ECHO; Normal LV function, grade 1 diastolic dysfunction; trace MR and AI; mildly elevated pulmonary pressure.  Consultations:  none  Discharge Exam: Filed Vitals:   01/08/15 0543  BP: 157/79  Pulse: 80  Temp: 98 F (36.7 C)  Resp: 19    General: Alert in no distress.  Cardiovascular: S 1, S 2 RRR Respiratory: CTA  Discharge Instructions   Discharge Instructions    Diet - low sodium heart healthy  Complete by:  As directed      Increase activity slowly    Complete by:  As directed           Current Discharge Medication List    START taking these medications   Details  benzonatate (TESSALON) 200 MG capsule Take 1 capsule (200 mg total) by mouth 3 (three) times daily as  needed for cough. Qty: 20 capsule, Refills: 0    ferrous sulfate 325 (65 FE) MG tablet Take 1 tablet (325 mg total) by mouth 2 (two) times daily with a meal. Qty: 60 tablet, Refills: 3    ipratropium-albuterol (DUONEB) 0.5-2.5 (3) MG/3ML SOLN Take 3 mLs by nebulization every 4 (four) hours. Qty: 360 mL, Refills: 0    levofloxacin (LEVAQUIN) 250 MG tablet Take 1 tablet (250 mg total) by mouth daily. Qty: 3 tablet, Refills: 0      CONTINUE these medications which have CHANGED   Details  dexamethasone (DECADRON) 4 MG tablet Take 2 tablets for 3 days then resume 4 mg daily Qty: 20 tablet, Refills: 0      CONTINUE these medications which have NOT CHANGED   Details  amLODipine (NORVASC) 5 MG tablet Take 5 mg by mouth daily.    etodolac (LODINE) 400 MG tablet Take 400 mg by mouth 2 (two) times daily.    Ipratropium-Albuterol (COMBIVENT) 20-100 MCG/ACT AERS respimat Inhale 1 puff into the lungs every 6 (six) hours.    Umeclidinium-Vilanterol 62.5-25 MCG/INH AEPB Inhale 1 puff into the lungs daily. as directed    Vitamin D, Ergocalciferol, (DRISDOL) 50000 UNITS CAPS capsule Take 50,000 Units by mouth every 7 (seven) days.       Allergies  Allergen Reactions  . Arlice Colt Isothiocyanate] Hives      The results of significant diagnostics from this hospitalization (including imaging, microbiology, ancillary and laboratory) are listed below for reference.    Significant Diagnostic Studies: US Aorta  12/19/2014   CLINICAL DATA:  Weakness. Dizziness. Questionable abdominal aortic aneurysm. History of hypertension.  EXAM: ULTRASOUND OF ABDOMINAL AORTA  TECHNIQUE: Ultrasound examination of the abdominal aorta was performed to evaluate for abdominal aortic aneurysm.  COMPARISON:  None.  FINDINGS: Abdominal Aorta  Aorta is ectatic, but no focal aneurysm is seen. There is atherosclerotic irregularity along the aorta, most evident distally.  Maximum AP  Diameter: 2.6 cm proximally, 2.0 cm  in the midportion and 1.9 cm distally.  Maximum TRV  Diameter: 2.5 cm proximally, 2.2 cm in the midportion and 2.6 cm distally.  Common iliac arteries measure 1 cm in diameter.  IMPRESSION: 1. Abdominal aorta is ectatic with no focal aneurysm seen. Largest dimension is a transverse dimension of the distal aorta, 2.6 cm, with the AP dimension being 1.9 cm. Atherosclerotic irregularity is noted along the aorta.   Electronically Signed   By: Amie Portland M.D.   On: 12/19/2014 21:55   Dg Chest Portable 1 View  01/04/2015   CLINICAL DATA:  Short of breath with chest tightness. History of bronchitis.  EXAM: PORTABLE CHEST - 1 VIEW  COMPARISON:  Of 04/18/2013  FINDINGS: Numerous leads and wires project over the chest. Midline trachea. Normal heart size with advanced atherosclerosis in the transverse aorta. No pleural effusion or pneumothorax. Right paratracheal soft tissue fullness is similar and likely due to great vessel shadow. No pleural effusion or pneumothorax. Hyperinflation. Diffuse peribronchial thickening.  IMPRESSION: COPD/chronic bronchitis.  No acute superimposed process.  Advanced for age aortic atherosclerosis.   Electronically Signed  By: Jeronimo Greaves M.D.   On: 01/04/2015 12:57    Microbiology: Recent Results (from the past 240 hour(s))  Blood culture (routine x 2)     Status: None (Preliminary result)   Collection Time: 01/04/15  3:20 PM  Result Value Ref Range Status   Specimen Description BLOOD RIGHT ARM  Final   Special Requests BOTTLES DRAWN AEROBIC AND ANAEROBIC 10 CC  Final   Culture   Final           BLOOD CULTURE RECEIVED NO GROWTH TO DATE CULTURE WILL BE HELD FOR 5 DAYS BEFORE ISSUING A FINAL NEGATIVE REPORT Performed at [facility]    Report Status PENDING  Incomplete  Blood culture (routine x 2)     Status: None (Preliminary result)   Collection Time: 01/04/15  3:27 PM  Result Value Ref Range Status   Specimen Description BLOOD RIGHT HAND  Final   Special  Requests BOTTLES DRAWN AEROBIC ONLY 9 CC  Final   Culture   Final           BLOOD CULTURE RECEIVED NO GROWTH TO DATE CULTURE WILL BE HELD FOR 5 DAYS BEFORE ISSUING A FINAL NEGATIVE REPORT Performed at [facility]    Report Status PENDING  Incomplete  MRSA PCR Screening     Status: None   Collection Time: 01/04/15  5:15 PM  Result Value Ref Range Status   MRSA by PCR NEGATIVE NEGATIVE Final    Comment:        The GeneXpert MRSA Assay (FDA approved for NASAL specimens only), is one component of a comprehensive MRSA colonization surveillance program. It is not intended to diagnose MRSA infection nor to guide or monitor treatment for MRSA infections.      Labs: Basic Metabolic Panel:  Recent Labs Lab 01/04/15 1143 01/05/15 0250 01/06/15 0340  NA 139 139 139  K 3.7 4.8 4.4  CL 105 106 110  CO2 GLUCOSE 155* 132* 158*  BUN CREATININE 0.89 1.04 0.85  CALCIUM 8.3* 8.6 8.3*   Liver Function Tests:  Recent Labs Lab 01/05/15 0250  AST 19  ALT 9  ALKPHOS 55  BILITOT 0.5  PROT 6.5  ALBUMIN 2.9*   No results for input(s): LIPASE, AMYLASE in the last 168 hours. No results for input(s): AMMONIA in the last 168 hours. CBC:  Recent Labs Lab 01/04/15 1143 01/05/15 0250  WBC 10.1 6.7  HGB 10.6* 9.2*  HCT 34.2* 30.1*  MCV 78.4 78.2  PLT 280 267   Cardiac Enzymes: No results for input(s): CKTOTAL, CKMB, CKMBINDEX, TROPONINI in the last 168 hours. BNP: BNP (last 3 results) No results for input(s): BNP in the last 8760 hours.  ProBNP (last 3 results) No results for input(s): PROBNP in the last 8760 hours.  CBG: No results for input(s): GLUCAP in the last 168 hours.     SignedHartley Barefoot A  [facility] 01/08/2015, 8:51 AM

## 2015-01-10 LAB — CULTURE, BLOOD (ROUTINE X 2)
Culture: NO GROWTH
Culture: NO GROWTH

## 2015-01-21 ENCOUNTER — Other Ambulatory Visit (HOSPITAL_COMMUNITY): Payer: Self-pay | Admitting: Radiology

## 2015-01-21 DIAGNOSIS — J441 Chronic obstructive pulmonary disease with (acute) exacerbation: Secondary | ICD-10-CM

## 2015-02-03 ENCOUNTER — Ambulatory Visit (HOSPITAL_COMMUNITY)
Admission: RE | Admit: 2015-02-03 | Discharge: 2015-02-03 | Disposition: A | Discharge status: Home / Self Care | Payer: Medicare PPO | Source: Ambulatory Visit | Attending: Family Medicine | Admitting: Family Medicine

## 2015-02-03 DIAGNOSIS — J441 Chronic obstructive pulmonary disease with (acute) exacerbation: Secondary | ICD-10-CM | POA: Insufficient documentation

## 2015-02-03 LAB — SPIROMETRY WITH GRAPH
FEF 25-75 Pre: 0.24 L/sec
FEF2575-%Pred-Pre: 23 %
FEV1-%Pred-Pre: 56 %
FEV1-Pre: 0.73 L
FEV1FVC-%Pred-Pre: 69 %
FEV6-%Pred-Pre: 83 %
FEV6-Pre: 1.32 L
FEV6FVC-%Pred-Pre: 97 %
FVC-%Pred-Pre: 85 %
FVC-Pre: 1.42 L
Pre FEV1/FVC ratio: 51 %
Pre FEV6/FVC Ratio: 93 %

## 2015-03-03 ENCOUNTER — Emergency Department (HOSPITAL_COMMUNITY)
Admission: EM | Admit: 2015-03-03 | Discharge: 2015-03-03 | Disposition: A | Discharge status: Home / Self Care | Payer: Medicare PPO | Attending: Emergency Medicine | Admitting: Emergency Medicine

## 2015-03-03 ENCOUNTER — Encounter (HOSPITAL_COMMUNITY): Payer: Self-pay | Admitting: *Deleted

## 2015-03-03 ENCOUNTER — Emergency Department (HOSPITAL_COMMUNITY): Payer: Medicare PPO

## 2015-03-03 DIAGNOSIS — Z7951 Long term (current) use of inhaled steroids: Secondary | ICD-10-CM | POA: Insufficient documentation

## 2015-03-03 DIAGNOSIS — Z7952 Long term (current) use of systemic steroids: Secondary | ICD-10-CM | POA: Insufficient documentation

## 2015-03-03 DIAGNOSIS — Z79899 Other long term (current) drug therapy: Secondary | ICD-10-CM | POA: Insufficient documentation

## 2015-03-03 DIAGNOSIS — Z87891 Personal history of nicotine dependence: Secondary | ICD-10-CM | POA: Insufficient documentation

## 2015-03-03 DIAGNOSIS — J441 Chronic obstructive pulmonary disease with (acute) exacerbation: Secondary | ICD-10-CM | POA: Diagnosis not present

## 2015-03-03 DIAGNOSIS — Z791 Long term (current) use of non-steroidal anti-inflammatories (NSAID): Secondary | ICD-10-CM | POA: Insufficient documentation

## 2015-03-03 DIAGNOSIS — R0602 Shortness of breath: Secondary | ICD-10-CM | POA: Diagnosis present

## 2015-03-03 LAB — COMPREHENSIVE METABOLIC PANEL
ALT: 11 U/L — ABNORMAL LOW (ref 14–54)
AST: 19 U/L (ref 15–41)
Albumin: 2.5 g/dL — ABNORMAL LOW (ref 3.5–5.0)
Alkaline Phosphatase: 63 U/L (ref 38–126)
Anion gap: 9 (ref 5–15)
BUN: 8 mg/dL (ref 6–20)
CO2: 24 mmol/L (ref 22–32)
Calcium: 8.9 mg/dL (ref 8.9–10.3)
Chloride: 106 mmol/L (ref 101–111)
Creatinine, Ser: 0.86 mg/dL (ref 0.44–1.00)
GFR calc Af Amer: >60 mL/min (ref >60)
GFR calc non Af Amer: 59 mL/min — ABNORMAL LOW (ref >60)
Glucose, Bld: 113 mg/dL — ABNORMAL HIGH (ref 70–99)
Potassium: 4.4 mmol/L (ref 3.5–5.1)
Sodium: 139 mmol/L (ref 135–145)
Total Bilirubin: 0.5 mg/dL (ref 0.3–1.2)
Total Protein: 6.9 g/dL (ref 6.5–8.1)

## 2015-03-03 LAB — CBC
HCT: 32.5 % — ABNORMAL LOW (ref 36.0–46.0)
Hemoglobin: 10.3 g/dL — ABNORMAL LOW (ref 12.0–15.0)
MCH: 26.2 pg (ref 26.0–34.0)
MCHC: 31.7 g/dL (ref 30.0–36.0)
MCV: 82.7 fL (ref 78.0–100.0)
Platelets: 218 10*3/uL (ref 150–400)
RBC: 3.93 MIL/uL (ref 3.87–5.11)
RDW: 19.9 % — ABNORMAL HIGH (ref 11.5–15.5)
WBC: 5.4 10*3/uL (ref 4.0–10.5)

## 2015-03-03 MED ORDER — PREDNISONE 20 MG PO TABS: 40.0000 mg | ORAL_TABLET | Freq: Every day | ORAL | Status: AC

## 2015-03-03 MED ORDER — IPRATROPIUM-ALBUTEROL 0.5-2.5 (3) MG/3ML IN SOLN
3.0000 mL | Freq: Once | RESPIRATORY_TRACT | Status: AC
  Administered 2015-03-03: 3 mL via RESPIRATORY_TRACT
  Filled 2015-03-03: qty 3

## 2015-03-03 MED ORDER — PREDNISONE 20 MG PO TABS
60.0000 mg | ORAL_TABLET | Freq: Once | ORAL | Status: AC
  Administered 2015-03-03: 60 mg via ORAL
  Filled 2015-03-03: qty 3

## 2015-03-03 MED ORDER — AZITHROMYCIN 250 MG PO TABS: ORAL_TABLET | ORAL | Status: DC

## 2015-03-03 NOTE — ED Provider Notes (Signed)
CSN: [id number]     Arrival date & time 03/03/15  0940 History   First MD Initiated Contact with Patient 03/03/15 1352     Chief Complaint  Patient presents with  . Shortness of Breath     (Consider location/radiation/quality/duration/timing/severity/associated sxs/prior Treatment) HPI  81 year old female presents with recurrent dyspnea. Has not been feeling well over last 2 days but started feeling dyspneic this morning. Took her albuterol nebulizer with minimal relief. Feels similar but less severe to when she was admitted 2 months ago. Minimal cough. Chronically wears 2 L of oxygen, feels better once she was placed up to 3 L of oxygen. Has a history of COPD. No chest pain. No fevers. Has not had any lower extremity edema.  Past Medical History  Diagnosis Date  . Bronchitis   . COPD (chronic obstructive pulmonary disease)    Past Surgical History  Procedure Laterality Date  . Eye surgery     No family history on file. History  Substance Use Topics  . Smoking status: Former Smoker    Quit date: 01/04/2012  . Smokeless tobacco: Not on file  . Alcohol Use: No   OB History    No data available     Review of Systems  Constitutional: Negative for fever and chills.  Respiratory: Positive for shortness of breath. Negative for cough.   Cardiovascular: Negative for chest pain and leg swelling.  All other systems reviewed and are negative.     Allergies  Mustard  Home Medications   Prior to Admission medications   Medication Sig Start Date End Date Taking? Authorizing Provider  amLODipine (NORVASC) 5 MG tablet Take 5 mg by mouth daily.   Yes Historical Provider, MD  benzonatate (TESSALON) 200 MG capsule Take 1 capsule (200 mg total) by mouth 3 (three) times daily as needed for cough. 01/08/15  Yes Belkys A Regalado, MD  etodolac (LODINE) 400 MG tablet Take 400 mg by mouth 2 (two) times daily.   Yes Historical Provider, MD  ferrous sulfate 325 (65 FE) MG tablet Take 1 tablet  (325 mg total) by mouth 2 (two) times daily with a meal. 01/08/15  Yes Belkys A Regalado, MD  Ipratropium-Albuterol (COMBIVENT) 20-100 MCG/ACT AERS respimat Inhale 1 puff into the lungs every 6 (six) hours.   Yes Historical Provider, MD  ipratropium-albuterol (DUONEB) 0.5-2.5 (3) MG/3ML SOLN Take 3 mLs by nebulization every 4 (four) hours. 01/08/15  Yes Belkys A Regalado, MD  Umeclidinium-Vilanterol 62.5-25 MCG/INH AEPB Inhale 1 puff into the lungs daily. as directed   Yes Historical Provider, MD  Vitamin D, Ergocalciferol, (DRISDOL) 50000 UNITS CAPS capsule Take 50,000 Units by mouth every 7 (seven) days.   Yes Historical Provider, MD  azithromycin (ZITHROMAX Z-PAK) 250 MG tablet 2 po day one, then 1 daily x 4 days 03/03/15   Pricilla LovelessScott Neha Waight, MD  dexamethasone (DECADRON) 4 MG tablet Take 2 tablets for 3 days then resume 4 mg daily Patient not taking: Reported on 03/03/2015 01/08/15   Belkys A Regalado, MD  predniSONE (DELTASONE) 20 MG tablet Take 2 tablets (40 mg total) by mouth daily. 03/04/15 03/07/15  Pricilla LovelessScott Dinita Migliaccio, MD   BP 141/55 mmHg  Pulse 87  Temp(Src) 98.2 F (36.8 C) (Oral)  Resp 24  Ht 5\' 2"  (1.575 m)  Wt 135 lb (61.236 kg)  BMI 24.69 kg/m2  SpO2 99% Physical Exam  Constitutional: She is oriented to person, place, and time. She appears well-developed and well-nourished. No distress.  HENT:  Head: Normocephalic  and atraumatic.  Right Ear: External ear normal.  Left Ear: External ear normal.  Nose: Nose normal.  Eyes: Right eye exhibits no discharge. Left eye exhibits no discharge.  Cardiovascular: Normal rate, regular rhythm and normal heart sounds.   Pulmonary/Chest: Effort normal. No accessory muscle usage. No respiratory distress. She has decreased breath sounds in the right lower field and the left lower field. She has wheezes (faint expiratory wheezes).  Abdominal: Soft. There is no tenderness.  Musculoskeletal: She exhibits no edema.  Neurological: She is alert and oriented to  person, place, and time.  Skin: Skin is warm and dry.  Nursing note and vitals reviewed.   ED Course  Procedures (including critical care time) Labs Review Labs Reviewed  COMPREHENSIVE METABOLIC PANEL - Abnormal; Notable for the following:    Glucose, Bld 113 (*)    Albumin 2.5 (*)    ALT 11 (*)    GFR calc non Af Amer 59 (*)    All other components within normal limits  CBC - Abnormal; Notable for the following:    Hemoglobin 10.3 (*)    HCT 32.5 (*)    RDW 19.9 (*)    All other components within normal limits    Imaging Review Dg Chest 2 View  03/03/2015   CLINICAL DATA:  COPD.  EXAM: CHEST  2 VIEW  COMPARISON:  None.  FINDINGS: Mediastinum and hilar structures normal. Mild interstitial prominence noted. Mild pneumonitis cannot be excluded. Stable cardiomegaly . No pleural effusion or pneumothorax. No acute bony abnormality identified.  IMPRESSION: 1. Mild interstitial prominence noted. Mild pneumonitis cannot be excluded . 2. Stable chronic interstitial lung disease.   Electronically Signed   By: Maisie Fus  Register   On: 03/03/2015 11:39     EKG Interpretation   Date/Time:  Monday Mar 03 2015 09:49:45 EDT Ventricular Rate:  88 PR Interval:  130 QRS Duration: 80 QT Interval:  360 QTC Calculation: 435 R Axis:   94 Text Interpretation:  Normal sinus rhythm Rightward axis Borderline ECG no  significant change since Mar 2016 Confirmed by Criss Alvine  MD, Jerrelle Michelsen ([id number])  on 03/03/2015 1:55:11 PM      MDM   Final diagnoses:  COPD exacerbation    Patient with a mild COPD exacerbation. No focal pneumonia on chest x-ray. Patient symptomatically feels better after a DuoNeb in the ER and her lower lung decreased breath sounds seem to improve. A mild wheezing noted the patient states she feels significantly better and wants to go home. She is currently not hypoxic on her home 2 L of oxygen. At this point will treat with steroids, continued albuterol home, and follow-up closely with  PCP.    Pricilla Loveless, MD 03/03/15 1556

## 2015-03-03 NOTE — ED Notes (Signed)
Pt with hx of COPD to ED c/o waking up from sleep with copd. Pt does not appear in respiratory distress at this time once placed on 3L [place].  Denies chest pain or cough.

## 2016-03-03 ENCOUNTER — Other Ambulatory Visit: Payer: Self-pay

## 2016-03-03 DIAGNOSIS — Z1231 Encounter for screening mammogram for malignant neoplasm of breast: Secondary | ICD-10-CM

## 2016-03-23 ENCOUNTER — Ambulatory Visit
Admission: RE | Admit: 2016-03-23 | Discharge: 2016-03-23 | Disposition: A | Discharge status: Home / Self Care | Payer: Medicare Other | Source: Ambulatory Visit

## 2016-03-23 DIAGNOSIS — Z1231 Encounter for screening mammogram for malignant neoplasm of breast: Secondary | ICD-10-CM

## 2016-06-15 ENCOUNTER — Other Ambulatory Visit: Payer: Self-pay | Admitting: Family Medicine

## 2016-06-15 ENCOUNTER — Ambulatory Visit
Admission: RE | Admit: 2016-06-15 | Discharge: 2016-06-15 | Disposition: A | Discharge status: Home / Self Care | Payer: Medicare Other | Source: Ambulatory Visit | Attending: Family Medicine | Admitting: Family Medicine

## 2016-06-15 DIAGNOSIS — M130 Polyarthritis, unspecified: Secondary | ICD-10-CM

## 2016-12-07 IMAGING — CR DG CERVICAL SPINE COMPLETE 4+V
6 series · 6 of 6 positions shown · non-contrast
Comparison: None.

CLINICAL DATA: Neck and right shoulder pain of 2 months duration.
Initial encounter

EXAM:
CERVICAL SPINE  4+ VIEWS

[view not recorded (1 of 6)]
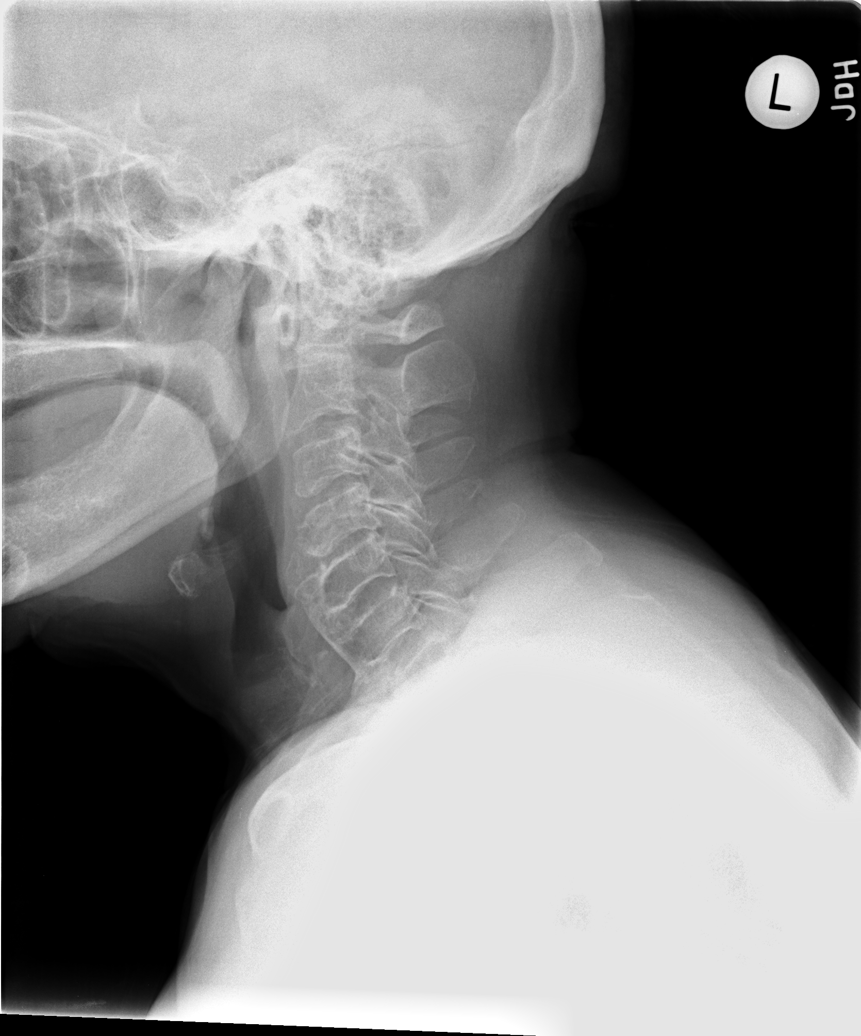

[view not recorded (2 of 6)]
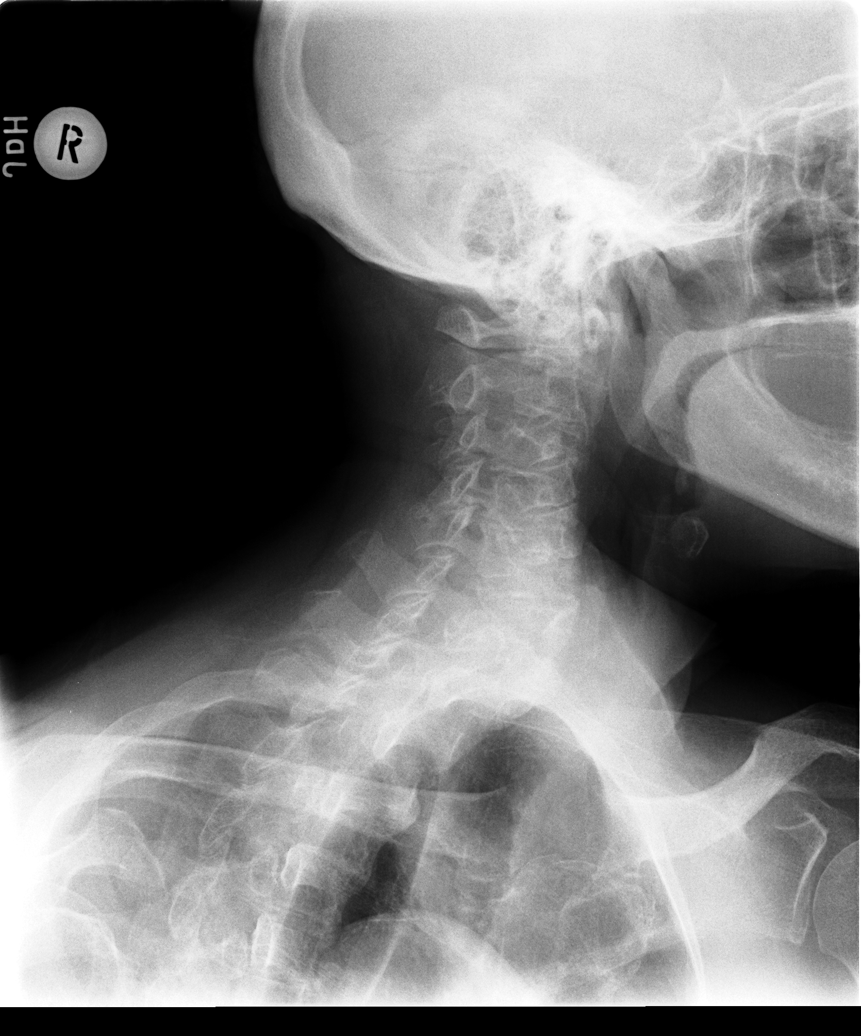

[view not recorded (3 of 6)]
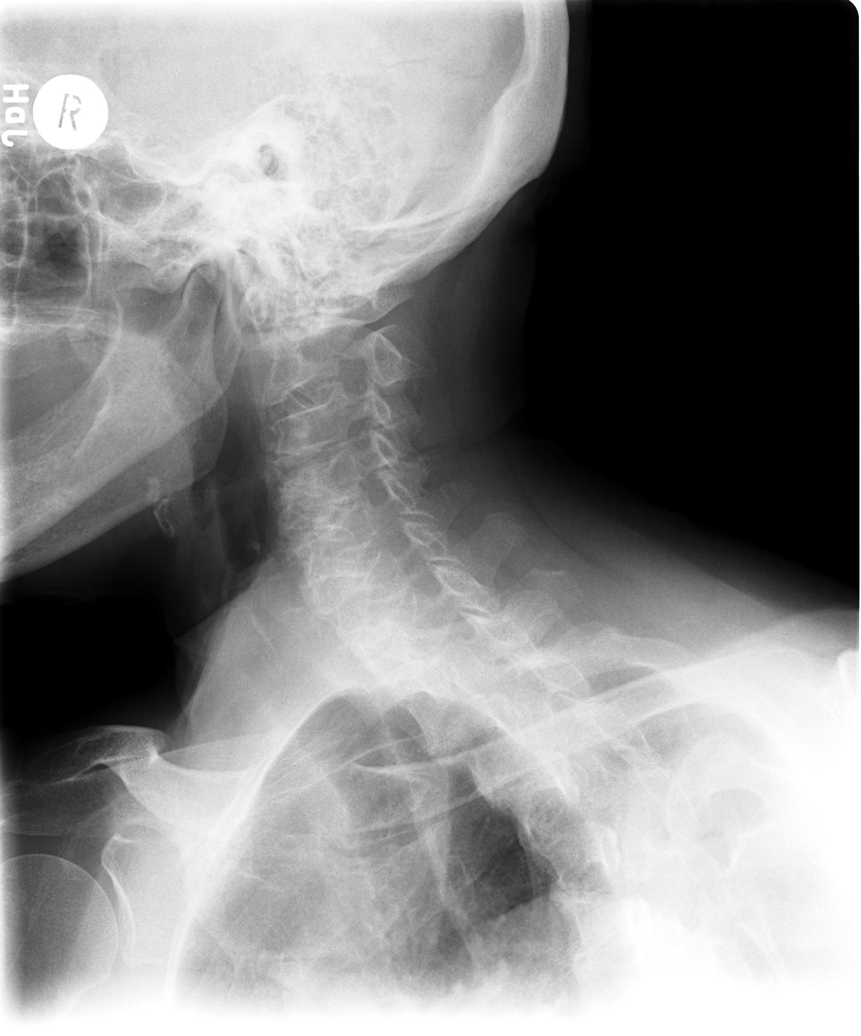

[view not recorded (4 of 6)]
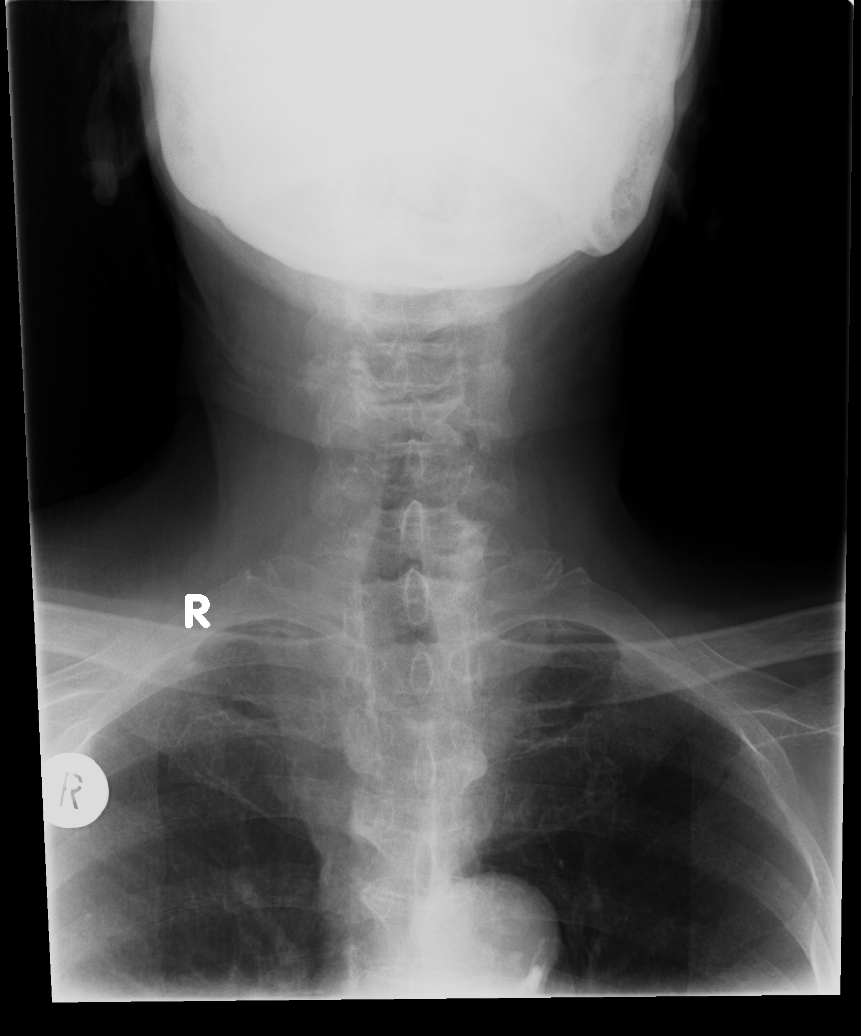

[view not recorded (5 of 6)]
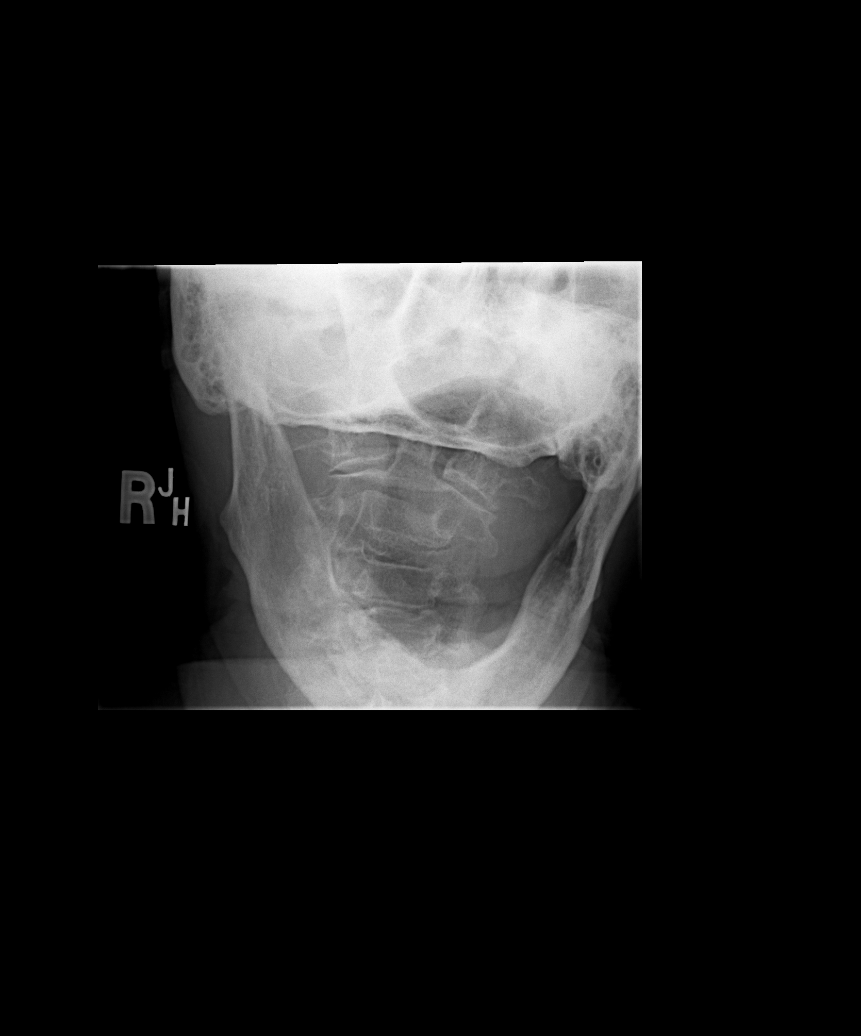

[view not recorded (6 of 6)]
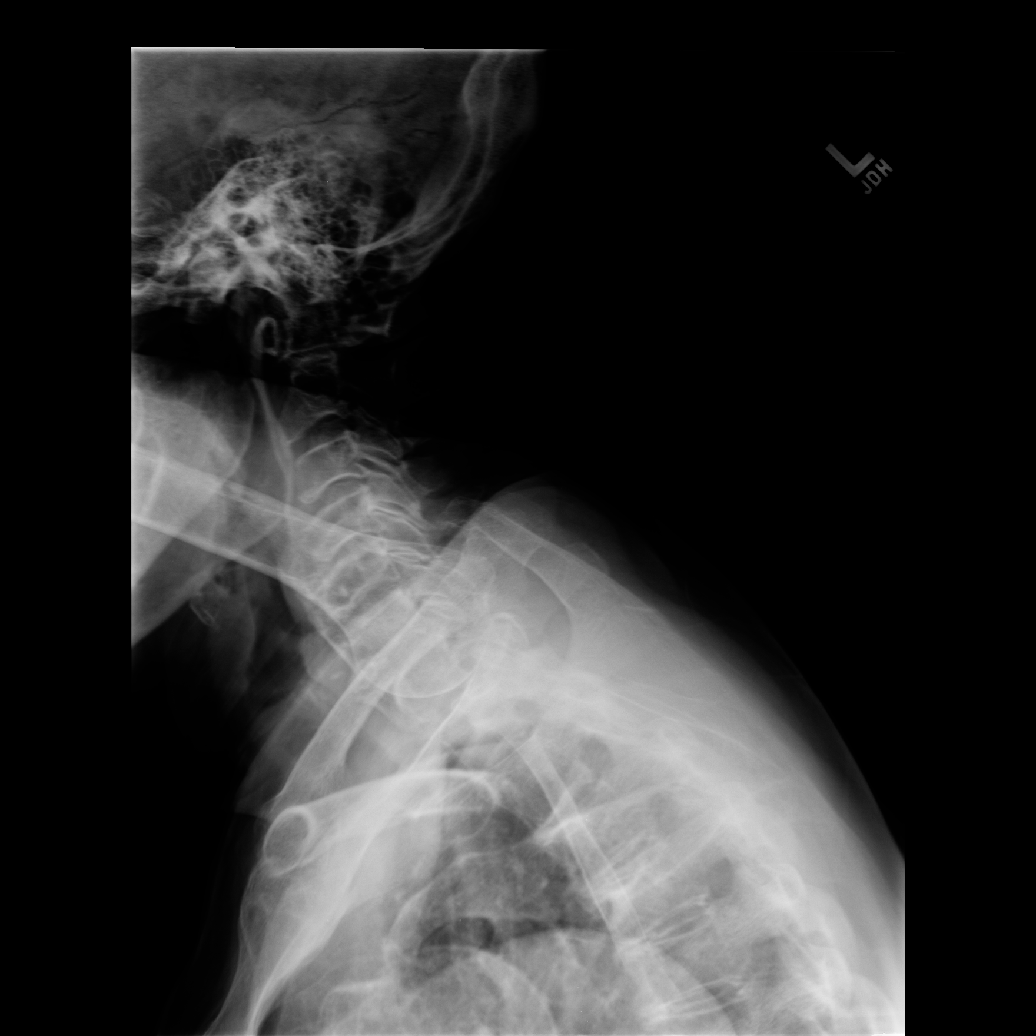

[6 of 6 positions shown; findings below may reference images not displayed]

FINDINGS: The cervical vertebrae are normal in height. Moderate degenerative
cervical spondylosis is present with moderate osteophytic
encroachment on the right C3-4 neural foramen and moderately severe
osteophytic encroachment on the right C4-5 foramen. There are
anterior bridging osteophytes from C5 through C7. No bone lesions or
bony destruction are evident. No fracture is evident.
IMPRESSION: Degenerative cervical spondylosis, greatest on the right at C4-5.

## 2017-02-24 ENCOUNTER — Other Ambulatory Visit: Payer: Self-pay | Admitting: Family Medicine

## 2017-02-24 DIAGNOSIS — Z1231 Encounter for screening mammogram for malignant neoplasm of breast: Secondary | ICD-10-CM

## 2017-03-30 ENCOUNTER — Ambulatory Visit
Admission: RE | Admit: 2017-03-30 | Discharge: 2017-03-30 | Disposition: A | Discharge status: Home / Self Care | Payer: Medicare Other | Source: Ambulatory Visit | Attending: Family Medicine | Admitting: Family Medicine

## 2017-03-30 DIAGNOSIS — Z1231 Encounter for screening mammogram for malignant neoplasm of breast: Secondary | ICD-10-CM

## 2017-04-29 ENCOUNTER — Emergency Department (HOSPITAL_COMMUNITY): Payer: Medicare Other

## 2017-04-29 ENCOUNTER — Emergency Department (HOSPITAL_COMMUNITY)
Admission: EM | Admit: 2017-04-29 | Discharge: 2017-04-29 | Disposition: A | Discharge status: Home / Self Care | Payer: Medicare Other | Attending: Emergency Medicine | Admitting: Emergency Medicine

## 2017-04-29 ENCOUNTER — Other Ambulatory Visit: Payer: Self-pay

## 2017-04-29 ENCOUNTER — Encounter (HOSPITAL_COMMUNITY): Payer: Self-pay | Admitting: Emergency Medicine

## 2017-04-29 DIAGNOSIS — I10 Essential (primary) hypertension: Secondary | ICD-10-CM | POA: Diagnosis not present

## 2017-04-29 DIAGNOSIS — Z87891 Personal history of nicotine dependence: Secondary | ICD-10-CM | POA: Insufficient documentation

## 2017-04-29 DIAGNOSIS — Z79899 Other long term (current) drug therapy: Secondary | ICD-10-CM | POA: Insufficient documentation

## 2017-04-29 DIAGNOSIS — J441 Chronic obstructive pulmonary disease with (acute) exacerbation: Secondary | ICD-10-CM | POA: Diagnosis not present

## 2017-04-29 DIAGNOSIS — R0602 Shortness of breath: Secondary | ICD-10-CM | POA: Diagnosis present

## 2017-04-29 HISTORY — DX: Essential (primary) hypertension: I10

## 2017-04-29 LAB — CBC WITH DIFFERENTIAL/PLATELET
Basophils Absolute: 0 10*3/uL (ref 0.0–0.1)
Basophils Relative: 0 %
Eosinophils Absolute: 0.5 10*3/uL (ref 0.0–0.7)
Eosinophils Relative: 9 %
HCT: 39.9 % (ref 36.0–46.0)
Hemoglobin: 12.5 g/dL (ref 12.0–15.0)
Lymphocytes Relative: 21 %
Lymphs Abs: 1.2 10*3/uL (ref 0.7–4.0)
MCH: 28.5 pg (ref 26.0–34.0)
MCHC: 31.3 g/dL (ref 30.0–36.0)
MCV: 90.9 fL (ref 78.0–100.0)
Monocytes Absolute: 0.2 10*3/uL (ref 0.1–1.0)
Monocytes Relative: 4 %
Neutro Abs: 3.7 10*3/uL (ref 1.7–7.7)
Neutrophils Relative %: 66 %
Platelets: 245 10*3/uL (ref 150–400)
RBC: 4.39 MIL/uL (ref 3.87–5.11)
RDW: 14.6 % (ref 11.5–15.5)
WBC: 5.6 10*3/uL (ref 4.0–10.5)

## 2017-04-29 LAB — I-STAT TROPONIN, ED: Troponin i, poc: 0 ng/mL (ref 0.00–0.08)

## 2017-04-29 LAB — BASIC METABOLIC PANEL
Anion gap: 7 (ref 5–15)
BUN: 7 mg/dL (ref 6–20)
CO2: 31 mmol/L (ref 22–32)
Calcium: 9 mg/dL (ref 8.9–10.3)
Chloride: 104 mmol/L (ref 101–111)
Creatinine, Ser: 0.94 mg/dL (ref 0.44–1.00)
GFR calc Af Amer: >60 mL/min (ref >60)
GFR calc non Af Amer: 53 mL/min — ABNORMAL LOW (ref >60)
Glucose, Bld: 149 mg/dL — ABNORMAL HIGH (ref 65–99)
Potassium: 4.1 mmol/L (ref 3.5–5.1)
Sodium: 142 mmol/L (ref 135–145)

## 2017-04-29 MED ORDER — PREDNISONE 20 MG PO TABS: ORAL_TABLET | ORAL | 0 refills | Status: DC

## 2017-04-29 MED ORDER — IPRATROPIUM-ALBUTEROL 0.5-2.5 (3) MG/3ML IN SOLN
3.0000 mL | RESPIRATORY_TRACT | Status: AC
  Administered 2017-04-29: 3 mL via RESPIRATORY_TRACT
  Filled 2017-04-29: qty 3

## 2017-04-29 NOTE — ED Notes (Signed)
Pt reports SHOB for the past 3-4 days, worsening today. Pt reports mainly SHOB on exertion until today. Pt reports feeling much better since EMS tx. Pt has expiratory wheezing on R side, diminished on L side. Pt alert and oriented, o2 sats 96-98% on room air.

## 2017-04-29 NOTE — ED Triage Notes (Signed)
Pt arrived via [facility] from home c/o respiratory distress x1 week with increasing distress today. Pt took her home rescue inhaler but saw no improvement improvement. EMS gave 5mg  albuterol, 0.5mg  atrovent, 2 gm or mag sulfate, and 125mg  solumedrol. Pt is alert and oriented x4.

## 2017-04-29 NOTE — Discharge Instructions (Signed)
Use your inhaler every 4 hours while awake

## 2017-04-29 NOTE — ED Provider Notes (Signed)
[facility]-EMER[facility]ENCY DEPT Provider Note   CSN: [id number] Arrival date & time: 04/29/17  1753     History   Chief Complaint Chief Complaint  Patient presents with  . Shortness of Breath    HPI Sheri [name] is a 83 y.o. female.  83 yo F with a chief complaint of shortness breath. Going on for the past week. Feels like her prior COPD. Trying her home medications without improvement. Arrived via EMS after getting a DuoNeb and albuterol treatment steroids and feels significantly better. Denies lower extremity edema. Denies chest pain. Denies diaphoresis. Denies exertional symptoms.   The history is provided by the patient.  Shortness of Breath  This is a new problem. The average episode lasts 1 week. The problem occurs continuously.The current episode started more than 1 week ago. The problem has been gradually worsening. Pertinent negatives include no fever, no headaches, no rhinorrhea, no wheezing, no chest pain and no vomiting. She has tried beta-agonist inhalers and leukotriene antagonists for the symptoms. The treatment provided no relief. Associated medical issues include COPD.    Past Medical History:  Diagnosis Date  . Bronchitis   . COPD (chronic obstructive pulmonary disease) (HCC)   . Hypertension     Patient Active Problem List   Diagnosis Date Noted  . COPD exacerbation (HCC) 01/04/2015  . Acute respiratory failure with hypoxia and hypercarbia (HCC) 01/04/2015  . Former tobacco use 01/04/2015  . HTN (hypertension) 01/04/2015  . Acute renal failure (HCC) 01/04/2015  . Anemia 01/04/2015    Past Surgical History:  Procedure Laterality Date  . EYE SURGERY    . TUBAL LIGATION      OB History    No data available       Home Medications    Prior to Admission medications   Medication Sig Start Date End Date Taking? Authorizing Provider  amLODipine (NORVASC) 5 MG tablet Take 5 mg by mouth daily.    [provider]  azithromycin (ZITHROMAX Z-PAK) 250  MG tablet 2 po day one, then 1 daily x 4 days 03/03/15   Pricilla Loveless, MD  benzonatate (TESSALON) 200 MG capsule Take 1 capsule (200 mg total) by mouth 3 (three) times daily as needed for cough. 01/08/15   Regalado, Belkys A, MD  dexamethasone (DECADRON) 4 MG tablet Take 2 tablets for 3 days then resume 4 mg daily Patient not taking: Reported on 03/03/2015 01/08/15   Regalado, Jon Billings A, MD  etodolac (LODINE) 400 MG tablet Take 400 mg by mouth 2 (two) times daily.    [provider]  ferrous sulfate 325 (65 FE) MG tablet Take 1 tablet (325 mg total) by mouth 2 (two) times daily with a meal. 01/08/15   Regalado, Belkys A, MD  Ipratropium-Albuterol (COMBIVENT) 20-100 MCG/ACT AERS respimat Inhale 1 puff into the lungs every 6 (six) hours.    [provider]  ipratropium-albuterol (DUONEB) 0.5-2.5 (3) MG/3ML SOLN Take 3 mLs by nebulization every 4 (four) hours. 01/08/15   Regalado, Belkys A, MD  predniSONE (DELTASONE) 20 MG tablet 2 tabs po daily x 4 days 04/29/17   Melene Plan, DO  Umeclidinium-Vilanterol 62.5-25 MCG/INH AEPB Inhale 1 puff into the lungs daily. as directed    [provider]  Vitamin D, Ergocalciferol, (DRISDOL) 50000 UNITS CAPS capsule Take 50,000 Units by mouth every 7 (seven) days.    [provider]    Family History Family History  Problem Relation Age of Onset  . Breast cancer Other  Social History Social History  Substance Use Topics  . Smoking status: Former Smoker    Quit date: 01/04/2012  . Smokeless tobacco: Never Used  . Alcohol use No     Allergies   Mustard [allyl isothiocyanate]   Review of Systems Review of Systems  Constitutional: Negative for chills and fever.  HENT: Negative for congestion and rhinorrhea.   Eyes: Negative for redness and visual disturbance.  Respiratory: Positive for shortness of breath. Negative for wheezing.   Cardiovascular: Negative for chest pain and palpitations.  Gastrointestinal: Negative for  nausea and vomiting.  Genitourinary: Negative for dysuria and urgency.  Musculoskeletal: Negative for arthralgias and myalgias.  Skin: Negative for pallor and wound.  Neurological: Negative for dizziness and headaches.     Physical Exam Updated Vital Signs BP (!) 183/93 (BP Location: Right Arm)   Pulse 81   Temp 98.1 F (36.7 C) (Oral)   Resp (!) 27   SpO2 96%   Physical Exam  Constitutional: She is oriented to person, place, and time. She appears well-developed and well-nourished. No distress.  HENT:  Head: Normocephalic and atraumatic.  Eyes: EOM are normal. Pupils are equal, round, and reactive to light.  Neck: Normal range of motion. Neck supple.  Cardiovascular: Normal rate and regular rhythm.  Exam reveals no gallop and no friction rub.   No murmur heard. Pulmonary/Chest: Effort normal. She has wheezes (faint, diffuse wheezes). She has no rales.  Abdominal: Soft. She exhibits no distension and no mass. There is no tenderness. There is no guarding.  Musculoskeletal: She exhibits no edema or tenderness.  Neurological: She is alert and oriented to person, place, and time.  Skin: Skin is warm and dry. She is not diaphoretic.  Psychiatric: She has a normal mood and affect. Her behavior is normal.  Nursing note and vitals reviewed.    ED Treatments / Results  Labs (all labs ordered are listed, but only abnormal results are displayed) Labs Reviewed  BASIC METABOLIC PANEL - Abnormal; Notable for the following:       Result Value   Glucose, Bld 149 (*)    GFR calc non Af Amer 53 (*)    All other components within normal limits  CBC WITH DIFFERENTIAL/PLATELET  Rosezena Sensor, ED    EKG  EKG Interpretation  Date/Time:  Friday April 29 2017 17:58:33 EDT Ventricular Rate:  86 PR Interval:    QRS Duration: 96 QT Interval:  369 QTC Calculation: 442 R Axis:   88 Text Interpretation:  Sinus rhythm Consider right atrial enlargement Anterior infarct, old No significant  change since last tracing Confirmed by Melene Plan [phone]) on 04/29/2017 7:53:54 PM       Radiology Dg Chest 2 View  Result Date: 04/29/2017 CLINICAL DATA:  Acute shortness of breath for 3 days. EXAM: CHEST  2 VIEW COMPARISON:  03/03/2015 and prior radiographs FINDINGS: Cardiomegaly and tortuous aorta again noted. Hyperinflation again noted. There is no evidence of focal airspace disease, pulmonary edema, suspicious pulmonary nodule/mass, pleural effusion, or pneumothorax. No acute bony abnormalities are identified. IMPRESSION: Cardiomegaly without evidence of acute cardiopulmonary disease. Electronically Signed   By: Harmon Pier M.D.   On: 04/29/2017 19:29    Procedures Procedures (including critical care time)  Medications Ordered in ED Medications  ipratropium-albuterol (DUONEB) 0.5-2.5 (3) MG/3ML nebulizer solution 3 mL (3 mLs Nebulization Given 04/29/17 1949)     Initial Impression / Assessment and Plan / ED Course  I have reviewed the triage vital signs and  the nursing notes.  Pertinent labs & imaging results that were available during my care of the patient were reviewed by me and considered in my medical decision making (see chart for details).     83 yo F With a chief complaint of shortness of breath. She feels most likely a COPD exacerbation. Clinically much better on arrival. With patient's age and some mild continued wheezing will give further breathing treatments and laboratory evaluation as well as a chest x-ray.  Feeling much better post breathing treatments.  D/c home.   9:57 PM:  I have discussed the diagnosis/risks/treatment options with the patient and family and believe the pt to be eligible for discharge home to follow-up with PCP. We also discussed returning to the ED immediately if new or worsening sx occur. We discussed the sx which are most concerning (e.g., sudden worsening sob, need to use inhaler more often than every 4) that necessitate immediate return.  Medications administered to the patient during their visit and any new prescriptions provided to the patient are listed below.  Medications given during this visit Medications  ipratropium-albuterol (DUONEB) 0.5-2.5 (3) MG/3ML nebulizer solution 3 mL (3 mLs Nebulization Given 04/29/17 1949)     The patient appears reasonably screen and/or stabilized for discharge and I doubt any other medical condition or other [facility] requiring further screening, evaluation, or treatment in the ED at this time prior to discharge.    Final Clinical Impressions(s) / ED Diagnoses   Final diagnoses:  COPD exacerbation (HCC)    New Prescriptions Discharge Medication List as of 04/29/2017  8:21 PM    START taking these medications   Details  predniSONE (DELTASONE) 20 MG tablet 2 tabs po daily x 4 days, Print         Melene PlanFloyd, Oluwatosin Higginson, DO 04/29/17 2157

## 2017-05-05 DIAGNOSIS — J449 Chronic obstructive pulmonary disease, unspecified: Secondary | ICD-10-CM | POA: Diagnosis not present

## 2017-05-05 DIAGNOSIS — M130 Polyarthritis, unspecified: Secondary | ICD-10-CM | POA: Diagnosis not present

## 2017-05-05 DIAGNOSIS — I10 Essential (primary) hypertension: Secondary | ICD-10-CM | POA: Diagnosis not present

## 2017-05-09 DIAGNOSIS — J449 Chronic obstructive pulmonary disease, unspecified: Secondary | ICD-10-CM | POA: Diagnosis not present

## 2017-05-17 DIAGNOSIS — M130 Polyarthritis, unspecified: Secondary | ICD-10-CM | POA: Diagnosis not present

## 2017-05-17 DIAGNOSIS — I10 Essential (primary) hypertension: Secondary | ICD-10-CM | POA: Diagnosis not present

## 2017-05-17 DIAGNOSIS — J449 Chronic obstructive pulmonary disease, unspecified: Secondary | ICD-10-CM | POA: Diagnosis not present

## 2017-05-30 DIAGNOSIS — M19041 Primary osteoarthritis, right hand: Secondary | ICD-10-CM | POA: Diagnosis not present

## 2017-05-30 DIAGNOSIS — M19042 Primary osteoarthritis, left hand: Secondary | ICD-10-CM | POA: Diagnosis not present

## 2017-05-30 DIAGNOSIS — M19031 Primary osteoarthritis, right wrist: Secondary | ICD-10-CM | POA: Diagnosis not present

## 2017-05-30 DIAGNOSIS — M19032 Primary osteoarthritis, left wrist: Secondary | ICD-10-CM | POA: Diagnosis not present

## 2017-05-31 DIAGNOSIS — J449 Chronic obstructive pulmonary disease, unspecified: Secondary | ICD-10-CM | POA: Diagnosis not present

## 2017-05-31 DIAGNOSIS — I10 Essential (primary) hypertension: Secondary | ICD-10-CM | POA: Diagnosis not present

## 2017-06-09 DIAGNOSIS — J449 Chronic obstructive pulmonary disease, unspecified: Secondary | ICD-10-CM | POA: Diagnosis not present

## 2017-06-20 DIAGNOSIS — H8303 Labyrinthitis, bilateral: Secondary | ICD-10-CM | POA: Diagnosis not present

## 2017-06-20 DIAGNOSIS — Z0000 Encounter for general adult medical examination without abnormal findings: Secondary | ICD-10-CM | POA: Diagnosis not present

## 2017-06-20 DIAGNOSIS — M130 Polyarthritis, unspecified: Secondary | ICD-10-CM | POA: Diagnosis not present

## 2017-06-20 DIAGNOSIS — N390 Urinary tract infection, site not specified: Secondary | ICD-10-CM | POA: Diagnosis not present

## 2017-06-20 DIAGNOSIS — R42 Dizziness and giddiness: Secondary | ICD-10-CM | POA: Diagnosis not present

## 2017-06-30 DIAGNOSIS — I10 Essential (primary) hypertension: Secondary | ICD-10-CM | POA: Diagnosis not present

## 2017-06-30 DIAGNOSIS — N390 Urinary tract infection, site not specified: Secondary | ICD-10-CM | POA: Diagnosis not present

## 2017-07-10 DIAGNOSIS — J449 Chronic obstructive pulmonary disease, unspecified: Secondary | ICD-10-CM | POA: Diagnosis not present

## 2017-07-26 DIAGNOSIS — J441 Chronic obstructive pulmonary disease with (acute) exacerbation: Secondary | ICD-10-CM | POA: Diagnosis not present

## 2017-07-26 DIAGNOSIS — Z23 Encounter for immunization: Secondary | ICD-10-CM | POA: Diagnosis not present

## 2017-07-26 DIAGNOSIS — Z79899 Other long term (current) drug therapy: Secondary | ICD-10-CM | POA: Diagnosis not present

## 2017-07-26 DIAGNOSIS — I119 Hypertensive heart disease without heart failure: Secondary | ICD-10-CM | POA: Diagnosis not present

## 2017-07-26 DIAGNOSIS — E7800 Pure hypercholesterolemia, unspecified: Secondary | ICD-10-CM | POA: Diagnosis not present

## 2017-07-26 DIAGNOSIS — E559 Vitamin D deficiency, unspecified: Secondary | ICD-10-CM | POA: Diagnosis not present

## 2017-08-09 DIAGNOSIS — J449 Chronic obstructive pulmonary disease, unspecified: Secondary | ICD-10-CM | POA: Diagnosis not present

## 2017-08-15 DIAGNOSIS — M545 Low back pain: Secondary | ICD-10-CM | POA: Diagnosis not present

## 2017-08-23 DIAGNOSIS — M25512 Pain in left shoulder: Secondary | ICD-10-CM | POA: Diagnosis not present

## 2017-08-23 DIAGNOSIS — R296 Repeated falls: Secondary | ICD-10-CM | POA: Diagnosis not present

## 2017-08-23 DIAGNOSIS — R2681 Unsteadiness on feet: Secondary | ICD-10-CM | POA: Diagnosis not present

## 2017-08-23 DIAGNOSIS — M545 Low back pain: Secondary | ICD-10-CM | POA: Diagnosis not present

## 2017-08-30 DIAGNOSIS — M25512 Pain in left shoulder: Secondary | ICD-10-CM | POA: Diagnosis not present

## 2017-08-30 DIAGNOSIS — R2681 Unsteadiness on feet: Secondary | ICD-10-CM | POA: Diagnosis not present

## 2017-08-30 DIAGNOSIS — M545 Low back pain: Secondary | ICD-10-CM | POA: Diagnosis not present

## 2017-08-30 DIAGNOSIS — R296 Repeated falls: Secondary | ICD-10-CM | POA: Diagnosis not present

## 2017-09-06 DIAGNOSIS — M25512 Pain in left shoulder: Secondary | ICD-10-CM | POA: Diagnosis not present

## 2017-09-06 DIAGNOSIS — M545 Low back pain: Secondary | ICD-10-CM | POA: Diagnosis not present

## 2017-09-06 DIAGNOSIS — R296 Repeated falls: Secondary | ICD-10-CM | POA: Diagnosis not present

## 2017-09-06 DIAGNOSIS — R2681 Unsteadiness on feet: Secondary | ICD-10-CM | POA: Diagnosis not present

## 2017-09-09 DIAGNOSIS — J449 Chronic obstructive pulmonary disease, unspecified: Secondary | ICD-10-CM | POA: Diagnosis not present

## 2017-09-13 DIAGNOSIS — R2681 Unsteadiness on feet: Secondary | ICD-10-CM | POA: Diagnosis not present

## 2017-09-13 DIAGNOSIS — M545 Low back pain: Secondary | ICD-10-CM | POA: Diagnosis not present

## 2017-09-13 DIAGNOSIS — R296 Repeated falls: Secondary | ICD-10-CM | POA: Diagnosis not present

## 2017-09-13 DIAGNOSIS — M25512 Pain in left shoulder: Secondary | ICD-10-CM | POA: Diagnosis not present

## 2017-09-19 DIAGNOSIS — M130 Polyarthritis, unspecified: Secondary | ICD-10-CM | POA: Diagnosis not present

## 2017-09-19 DIAGNOSIS — I10 Essential (primary) hypertension: Secondary | ICD-10-CM | POA: Diagnosis not present

## 2017-09-19 DIAGNOSIS — J449 Chronic obstructive pulmonary disease, unspecified: Secondary | ICD-10-CM | POA: Diagnosis not present

## 2017-09-27 DIAGNOSIS — M545 Low back pain: Secondary | ICD-10-CM | POA: Diagnosis not present

## 2017-09-27 DIAGNOSIS — R296 Repeated falls: Secondary | ICD-10-CM | POA: Diagnosis not present

## 2017-09-27 DIAGNOSIS — R2681 Unsteadiness on feet: Secondary | ICD-10-CM | POA: Diagnosis not present

## 2017-09-27 DIAGNOSIS — M25512 Pain in left shoulder: Secondary | ICD-10-CM | POA: Diagnosis not present

## 2017-10-04 DIAGNOSIS — R296 Repeated falls: Secondary | ICD-10-CM | POA: Diagnosis not present

## 2017-10-04 DIAGNOSIS — M25512 Pain in left shoulder: Secondary | ICD-10-CM | POA: Diagnosis not present

## 2017-10-04 DIAGNOSIS — R2681 Unsteadiness on feet: Secondary | ICD-10-CM | POA: Diagnosis not present

## 2017-10-04 DIAGNOSIS — M545 Low back pain: Secondary | ICD-10-CM | POA: Diagnosis not present

## 2017-10-09 DIAGNOSIS — J449 Chronic obstructive pulmonary disease, unspecified: Secondary | ICD-10-CM | POA: Diagnosis not present

## 2017-10-17 DIAGNOSIS — I509 Heart failure, unspecified: Secondary | ICD-10-CM | POA: Diagnosis not present

## 2017-11-09 DIAGNOSIS — J449 Chronic obstructive pulmonary disease, unspecified: Secondary | ICD-10-CM | POA: Diagnosis not present

## 2017-11-15 DIAGNOSIS — I10 Essential (primary) hypertension: Secondary | ICD-10-CM | POA: Diagnosis not present

## 2017-11-15 DIAGNOSIS — R0602 Shortness of breath: Secondary | ICD-10-CM | POA: Diagnosis not present

## 2017-11-22 DIAGNOSIS — E7800 Pure hypercholesterolemia, unspecified: Secondary | ICD-10-CM | POA: Diagnosis not present

## 2017-11-22 DIAGNOSIS — E559 Vitamin D deficiency, unspecified: Secondary | ICD-10-CM | POA: Diagnosis not present

## 2017-11-22 DIAGNOSIS — Z79899 Other long term (current) drug therapy: Secondary | ICD-10-CM | POA: Diagnosis not present

## 2017-11-22 DIAGNOSIS — I119 Hypertensive heart disease without heart failure: Secondary | ICD-10-CM | POA: Diagnosis not present

## 2017-11-22 DIAGNOSIS — J441 Chronic obstructive pulmonary disease with (acute) exacerbation: Secondary | ICD-10-CM | POA: Diagnosis not present

## 2017-12-09 DIAGNOSIS — Z0189 Encounter for other specified special examinations: Secondary | ICD-10-CM | POA: Diagnosis not present

## 2017-12-09 DIAGNOSIS — J449 Chronic obstructive pulmonary disease, unspecified: Secondary | ICD-10-CM | POA: Diagnosis not present

## 2017-12-09 DIAGNOSIS — R0602 Shortness of breath: Secondary | ICD-10-CM | POA: Diagnosis not present

## 2017-12-09 DIAGNOSIS — I10 Essential (primary) hypertension: Secondary | ICD-10-CM | POA: Diagnosis not present

## 2017-12-10 DIAGNOSIS — J449 Chronic obstructive pulmonary disease, unspecified: Secondary | ICD-10-CM | POA: Diagnosis not present

## 2017-12-27 DIAGNOSIS — I10 Essential (primary) hypertension: Secondary | ICD-10-CM | POA: Diagnosis not present

## 2017-12-27 DIAGNOSIS — M130 Polyarthritis, unspecified: Secondary | ICD-10-CM | POA: Diagnosis not present

## 2017-12-27 DIAGNOSIS — J441 Chronic obstructive pulmonary disease with (acute) exacerbation: Secondary | ICD-10-CM | POA: Diagnosis not present

## 2017-12-29 DIAGNOSIS — H401112 Primary open-angle glaucoma, right eye, moderate stage: Secondary | ICD-10-CM | POA: Diagnosis not present

## 2017-12-29 DIAGNOSIS — H401123 Primary open-angle glaucoma, left eye, severe stage: Secondary | ICD-10-CM | POA: Diagnosis not present

## 2017-12-29 DIAGNOSIS — H01003 Unspecified blepharitis right eye, unspecified eyelid: Secondary | ICD-10-CM | POA: Diagnosis not present

## 2017-12-29 DIAGNOSIS — H35033 Hypertensive retinopathy, bilateral: Secondary | ICD-10-CM | POA: Diagnosis not present

## 2018-01-07 DIAGNOSIS — J449 Chronic obstructive pulmonary disease, unspecified: Secondary | ICD-10-CM | POA: Diagnosis not present

## 2018-01-20 DIAGNOSIS — R0602 Shortness of breath: Secondary | ICD-10-CM | POA: Diagnosis not present

## 2018-01-20 DIAGNOSIS — R0989 Other specified symptoms and signs involving the circulatory and respiratory systems: Secondary | ICD-10-CM | POA: Diagnosis not present

## 2018-01-25 DIAGNOSIS — R0602 Shortness of breath: Secondary | ICD-10-CM | POA: Diagnosis not present

## 2018-01-25 DIAGNOSIS — J449 Chronic obstructive pulmonary disease, unspecified: Secondary | ICD-10-CM | POA: Diagnosis not present

## 2018-01-25 DIAGNOSIS — I272 Pulmonary hypertension, unspecified: Secondary | ICD-10-CM | POA: Diagnosis not present

## 2018-02-06 ENCOUNTER — Ambulatory Visit: Payer: Medicare Other | Admitting: Pulmonary Disease

## 2018-02-06 ENCOUNTER — Encounter: Payer: Self-pay | Admitting: Pulmonary Disease

## 2018-02-06 VITALS — BP 126/82 | HR 92 | Ht 62.0 in | Wt 138.0 lb

## 2018-02-06 DIAGNOSIS — J441 Chronic obstructive pulmonary disease with (acute) exacerbation: Secondary | ICD-10-CM

## 2018-02-06 DIAGNOSIS — R269 Unspecified abnormalities of gait and mobility: Secondary | ICD-10-CM | POA: Insufficient documentation

## 2018-02-06 DIAGNOSIS — J42 Unspecified chronic bronchitis: Secondary | ICD-10-CM

## 2018-02-06 MED ORDER — TIOTROPIUM BROMIDE MONOHYDRATE 1.25 MCG/ACT IN AERS: 2.0000 | INHALATION_SPRAY | Freq: Every day | RESPIRATORY_TRACT | 0 refills | Status: DC

## 2018-02-06 NOTE — Patient Instructions (Signed)
STOP taking TRELEGY diskus  Take Symbicort 2 puffs twice daily. Sample of Spiriva-2 puffs once daily, call for prescription if this works  If shortness of breath in between, then can take albuterol via nebulizer

## 2018-02-06 NOTE — Assessment & Plan Note (Signed)
Would benefit from pulmonary rehab but unfortunately due to gait issues would be high risk for fall

## 2018-02-06 NOTE — Progress Notes (Signed)
Subjective:    Patient ID: Sheri [name], female    DOB: [birth year], 84 y.o.   MRN: [id number]  HPI  Chief Complaint  Patient presents with  . Pulm Consult    Referred for SOB and COPD. Productive cough with white phlegm.     84 year old ex-smoker presents for evaluation of shortness of breath. She is accompanied by her daughter [name]. She was diagnosed with COPD many years ago and seen Dr. Amada Jupiter in the past. She has been maintained on Symbicort which she takes 1 puffs as needed 3-4 times a day.  She was also given a sample of trilogy but is not clear on whether she is still using this or not.  She reports shortness of breath ongoing for many years.  She reports dizziness and poor balance over the past few months.  She just underwent physical therapy but this does not seem to have helped her. She denies frequent chest colds.  She reports a chronic dry cough. Chest x-ray 04/2017 shows hyperinflation without infiltrates she denies pedal edema, orthopnea paroxysmal nocturnal dyspnea. She underwent cardiac evaluation including stress test and echo and was told that she does not have any cardiac conditions.  Results of this testing are not available to me at present.  She smoked 1/2 pack/day until she quit in 2003, about 20 pack years. She is widowed and lives with her granddaughter.  Her work history does not reveal any kind of occupational exposure  Significant tests/ events reviewed   ambulatory saturation dropped to 93% on 1 lap but she was very unsteady on her feet. Spirometry showed ratio of 40 with FEV1 of 52% and FVC of 102%   Past Medical History:  Diagnosis Date  . Bronchitis   . COPD (chronic obstructive pulmonary disease) (HCC)   . Hypertension    Past Surgical History:  Procedure Laterality Date  . EYE SURGERY    . TUBAL LIGATION       Allergies  Allergen Reactions  . Arlice Colt Isothiocyanate] Hives   Social History   Socioeconomic History    . Marital status: Widowed    Spouse name: Not on file  . Number of children: Not on file  . Years of education: Not on file  . Highest education level: Not on file  Occupational History  . Not on file  Social Needs  . Financial resource strain: Not on file  . Food insecurity:    Worry: Not on file    Inability: Not on file  . Transportation needs:    Medical: Not on file    Non-medical: Not on file  Tobacco Use  . Smoking status: Former Smoker    Types: Cigarettes    Last attempt to quit: 01/03/2002    Years since quitting: 16.1  . Smokeless tobacco: Never Used  Substance and Sexual Activity  . Alcohol use: No  . Drug use: No  . Sexual activity: Not on file  Lifestyle  . Physical activity:    Days per week: Not on file    Minutes per session: Not on file  . Stress: Not on file  Relationships  . Social connections:    Talks on phone: Not on file    Gets together: Not on file    Attends religious service: Not on file    Active member of club or organization: Not on file    Attends meetings of clubs or organizations: Not on file    Relationship status: Not on  file  . Intimate partner violence:    Fear of current or ex partner: Not on file    Emotionally abused: Not on file    Physically abused: Not on file    Forced sexual activity: Not on file  Other Topics Concern  . Not on file  Social History Narrative  . Not on file     Family History  Problem Relation Age of Onset  . Breast cancer Other      Review of Systems Positive for shortness of breath with activity, cough productive of minimal white phlegm.  Difficulty with balance and walking  Constitutional: negative for anorexia, fevers and sweats  Eyes: negative for irritation, redness and visual disturbance  Ears, nose, mouth, throat, and face: negative for earaches, epistaxis, nasal congestion and sore throat  Respiratory: negative for cough, , sputum and wheezing  Cardiovascular: negative for chest pain,   lower extremity edema, orthopnea, palpitations and syncope  Gastrointestinal: negative for abdominal pain, constipation, diarrhea, melena, nausea and vomiting  Genitourinary:negative for dysuria, frequency and hematuria  Hematologic/lymphatic: negative for bleeding, easy bruising and lymphadenopathy  Musculoskeletal:negative for arthralgias, muscle weakness and stiff joints  Neurological: negative for coordination problems,  headaches and weakness  Endocrine: negative for diabetic symptoms including polydipsia, polyuria and weight loss     Objective:   Physical Exam  Gen. Pleasant, elderly,well-nourished, in no distress, visibly dyspneic after walking ENT - no thrush, no post nasal drip Neck: No JVD, no thyromegaly, no carotid bruits Lungs: no use of accessory muscles, no dullness to percussion, decreased BL without rales or rhonchi  Cardiovascular: Rhythm regular, heart sounds  normal, no murmurs or gallops, no peripheral edema Musculoskeletal: No deformities, no cyanosis or clubbing         Assessment & Plan:

## 2018-02-06 NOTE — Assessment & Plan Note (Signed)
She does seem to have moderate to severe COPD.  Unclear why this is worsened over the past few months does not seem to have any overt CHF, no recent bronchitis or frequent chest colds Echo report is not available to me to assess for pulmonary hypertension.  STOP taking TRELEGY diskus  Take Symbicort 2 puffs twice daily. Sample of Spiriva-2 puffs once daily, call for prescription if this works  If shortness of breath in between, then can take albuterol via nebulizer

## 2018-02-07 DIAGNOSIS — J449 Chronic obstructive pulmonary disease, unspecified: Secondary | ICD-10-CM | POA: Diagnosis not present

## 2018-02-27 DIAGNOSIS — J449 Chronic obstructive pulmonary disease, unspecified: Secondary | ICD-10-CM | POA: Diagnosis not present

## 2018-02-27 DIAGNOSIS — R0602 Shortness of breath: Secondary | ICD-10-CM | POA: Diagnosis not present

## 2018-03-01 ENCOUNTER — Other Ambulatory Visit: Payer: Self-pay | Admitting: Family Medicine

## 2018-03-01 DIAGNOSIS — Z1231 Encounter for screening mammogram for malignant neoplasm of breast: Secondary | ICD-10-CM

## 2018-03-09 ENCOUNTER — Ambulatory Visit (INDEPENDENT_AMBULATORY_CARE_PROVIDER_SITE_OTHER)
Admission: RE | Admit: 2018-03-09 | Discharge: 2018-03-09 | Disposition: A | Discharge status: Home / Self Care | Payer: Medicare Other | Source: Ambulatory Visit | Attending: Adult Health | Admitting: Adult Health

## 2018-03-09 ENCOUNTER — Ambulatory Visit: Payer: Medicare Other | Admitting: Adult Health

## 2018-03-09 ENCOUNTER — Encounter: Payer: Self-pay | Admitting: Adult Health

## 2018-03-09 VITALS — BP 126/66 | HR 84 | Ht 65.0 in | Wt 148.8 lb

## 2018-03-09 DIAGNOSIS — J9611 Chronic respiratory failure with hypoxia: Secondary | ICD-10-CM | POA: Diagnosis not present

## 2018-03-09 DIAGNOSIS — Z23 Encounter for immunization: Secondary | ICD-10-CM | POA: Diagnosis not present

## 2018-03-09 DIAGNOSIS — J449 Chronic obstructive pulmonary disease, unspecified: Secondary | ICD-10-CM | POA: Diagnosis not present

## 2018-03-09 DIAGNOSIS — J441 Chronic obstructive pulmonary disease with (acute) exacerbation: Secondary | ICD-10-CM | POA: Diagnosis not present

## 2018-03-09 DIAGNOSIS — I10 Essential (primary) hypertension: Secondary | ICD-10-CM | POA: Diagnosis not present

## 2018-03-09 NOTE — Patient Instructions (Signed)
Continue on Symbicorrt and Spiriva . , rinse after use.  Chest xray today .  Prevnar 13 vaccine .  Continue on Oxygen 2l/m At bedtime   Follow up with Dr. Vassie Loll  In 4-6 months and As needed

## 2018-03-09 NOTE — Progress Notes (Signed)
  ID: Sheri Sheri [name], female    DOB: 1925-11-16, 84 y.o.   MRN: [id number]  No chief complaint on file.   Referring provider: Renaye Rakers, MD  HPI: 84 yo former smoker seen for pulmonary consult for COPD and dyspnea  Chronic respiratory failure on nocturnal oxygen 2l/m    TEST  ambulatory saturation dropped to 93% on 1 lap but she was very unsteady on her feet. Spirometry showed ratio of 40 with FEV1 of 52% and FVC of 102%  03/09/2018 Follow up ; COPD , O2 RF  Patient returns for a one-month follow-up.  Patient was seen last visit for a pulmonary consult for shortness of breath and COPD.  Patient was on TRELEGY change over to Symbicort and Spiriva.  Spirometry showed moderate to severe COPD with an FEV1 at 52% and a ratio at 40.  Walk test in the office showed no desaturations.  Patient has been on oxygen at bedtime for a few years.  Patient says overall that she is doing okay does feel that her breathing might be a little bit better since we last saw her.  She likes the Symbicort and Spiriva.  Patient has never had a pneumonia vaccine.  We discussed Prevnar vaccine today and she would like to get that.    Allergies  Allergen Reactions  . Sheri Sheri [name] Isothiocyanate] Hives    Immunization History  Administered Date(s) Administered  . Influenza, High Dose Seasonal PF 08/01/2017    Past Medical History:  Diagnosis Date  . Bronchitis   . COPD (chronic obstructive pulmonary disease) (HCC)   . Hypertension     Tobacco History: Social History   Tobacco Use  Smoking Status Former Smoker  . Packs/day: 0.50  . Types: Cigarettes  . Last attempt to quit: 01/03/2002  . Years since quitting: 16.1  Smokeless Tobacco Never Used   Counseling given: Not Answered   Outpatient Encounter Medications as of 03/09/2018  Medication Sig  . amLODipine (NORVASC) 5 MG tablet Take by mouth.  Marland Kitchen aspirin EC 81 MG tablet Take by mouth.  . indapamide (LOZOL) 1.25 MG tablet Take 1.25 mg  by mouth daily.  . mirtazapine (REMERON) 7.5 MG tablet every evening.  . predniSONE (DELTASONE) 5 MG tablet Take 5 mg by mouth 2 (two) times daily.  . simvastatin (ZOCOR) 40 MG tablet Take by mouth.  . SYMBICORT 160-4.5 MCG/ACT inhaler USE ONE INHALATION TWICE A DAY  . Tiotropium Bromide Monohydrate (SPIRIVA RESPIMAT) 1.25 MCG/ACT AERS Inhale 2 puffs into the lungs daily.  . [DISCONTINUED] TRELEGY ELLIPTA 100-62.5-25 MCG/INH AEPB ONE INHALATION DAILY   No facility-administered encounter medications on file as of 03/09/2018.      Review of Systems  Constitutional:   No  weight loss, night sweats,  Fevers, chills,  +fatigue, or  lassitude.  HEENT:   No headaches,  Difficulty swallowing,  Tooth/dental problems, or  Sore throat,                No sneezing, itching, ear ache, nasal congestion, post nasal drip,   CV:  No chest pain,  Orthopnea, PND, swelling in lower extremities, anasarca, dizziness, palpitations, syncope.   GI  No heartburn, indigestion, abdominal pain, nausea, vomiting, diarrhea, change in bowel habits, loss of appetite, bloody stools.   Resp:  .  No chest wall deformity  Skin: no rash or lesions.  GU: no dysuria, change in color of urine, no urgency or frequency.  No flank pain, no hematuria   MS:  No joint pain or swelling.  No decreased range of motion.  No back pain.    Physical Exam  BP 126/66 (BP Location: Left Arm, Cuff Size: Normal)   Sheri [name] 86   Ht  (1.651 m)   Wt 148 lb 12.8 oz (67.5 kg)   SpO2 94%   BMI 24.76 kg/m   GEN: A/Ox3; pleasant , NAD, well nourished appears younger than stated age   HEENT:  Sheri Sheri [name]/AT,  EACs-clear, TMs-wnl, NOSE-clear, THROAT-clear, no lesions, no postnasal drip or exudate noted.   NECK:  Supple w/ fair ROM; no JVD; normal carotid impulses w/o bruits; no thyromegaly or nodules palpated; no lymphadenopathy.    RESP decreased breath sounds in the bases  no accessory muscle use, no dullness to percussion  CARD:  RRR, no  m/r/g, tr  peripheral edema, pulses intact, no cyanosis or clubbing.  GI:   Soft & nt; nml bowel sounds; no organomegaly or masses detected.   Musco: Warm bil, no deformities or joint swelling noted.   Neuro: alert, no focal deficits noted.    Skin: Warm, no lesions or rashes    Lab Results:  CBC   BNP No results found for: BNP  ProBNP No results found for: PROBNP  Imaging: No results found.   Assessment & Plan:   COPD (chronic obstructive pulmonary disease) (HCC) Currently compensated on present regimen.  Prevnar vaccine and chest x-ray today  Plan  Patient Instructions  Continue on Symbicorrt and Spiriva . , rinse after use.  Chest xray today .  Prevnar 13 vaccine .  Continue on Oxygen 2l/m At bedtime   Follow up with Dr. Vassie [name]  In 4-6 months and As needed        Chronic respiratory failure with hypoxia (HCC) Continue on nocturnal oxygen     Sheri Oaks, NP 03/09/2018

## 2018-03-09 NOTE — Assessment & Plan Note (Signed)
Currently compensated on present regimen.  Prevnar vaccine and chest x-ray today  Plan  Patient Instructions  Continue on Symbicorrt and Spiriva . , rinse after use.  Chest xray today .  Prevnar 13 vaccine .  Continue on Oxygen 2l/m At bedtime   Follow up with Dr. Vassie Loll  In 4-6 months and As needed

## 2018-03-09 NOTE — Assessment & Plan Note (Signed)
Continue on nocturnal oxygen 

## 2018-03-10 NOTE — Progress Notes (Signed)
Reviewed & agree with plan  

## 2018-03-10 NOTE — Progress Notes (Signed)
Was able to talk to patient regarding patient's results.  They verbalized an understanding of what was discussed. No further questions at this time. 

## 2018-03-20 DIAGNOSIS — Z79899 Other long term (current) drug therapy: Secondary | ICD-10-CM | POA: Diagnosis not present

## 2018-03-20 DIAGNOSIS — I119 Hypertensive heart disease without heart failure: Secondary | ICD-10-CM | POA: Diagnosis not present

## 2018-03-20 DIAGNOSIS — E7800 Pure hypercholesterolemia, unspecified: Secondary | ICD-10-CM | POA: Diagnosis not present

## 2018-03-20 DIAGNOSIS — E559 Vitamin D deficiency, unspecified: Secondary | ICD-10-CM | POA: Diagnosis not present

## 2018-03-20 DIAGNOSIS — J441 Chronic obstructive pulmonary disease with (acute) exacerbation: Secondary | ICD-10-CM | POA: Diagnosis not present

## 2018-04-04 ENCOUNTER — Ambulatory Visit
Admission: RE | Admit: 2018-04-04 | Discharge: 2018-04-04 | Disposition: A | Discharge status: Home / Self Care | Payer: Medicare Other | Source: Ambulatory Visit | Attending: Family Medicine | Admitting: Family Medicine

## 2018-04-04 DIAGNOSIS — Z1231 Encounter for screening mammogram for malignant neoplasm of breast: Secondary | ICD-10-CM

## 2018-04-09 DIAGNOSIS — J449 Chronic obstructive pulmonary disease, unspecified: Secondary | ICD-10-CM | POA: Diagnosis not present

## 2018-04-26 DIAGNOSIS — J449 Chronic obstructive pulmonary disease, unspecified: Secondary | ICD-10-CM | POA: Diagnosis not present

## 2018-04-26 DIAGNOSIS — I272 Pulmonary hypertension, unspecified: Secondary | ICD-10-CM | POA: Diagnosis not present

## 2018-04-26 DIAGNOSIS — R0602 Shortness of breath: Secondary | ICD-10-CM | POA: Diagnosis not present

## 2018-05-09 DIAGNOSIS — J449 Chronic obstructive pulmonary disease, unspecified: Secondary | ICD-10-CM | POA: Diagnosis not present

## 2018-05-29 DIAGNOSIS — I10 Essential (primary) hypertension: Secondary | ICD-10-CM | POA: Diagnosis not present

## 2018-05-29 DIAGNOSIS — J441 Chronic obstructive pulmonary disease with (acute) exacerbation: Secondary | ICD-10-CM | POA: Diagnosis not present

## 2018-05-29 DIAGNOSIS — E1100 Type 2 diabetes mellitus with hyperosmolarity without nonketotic hyperglycemic-hyperosmolar coma (NKHHC): Secondary | ICD-10-CM | POA: Diagnosis not present

## 2018-06-09 DIAGNOSIS — J449 Chronic obstructive pulmonary disease, unspecified: Secondary | ICD-10-CM | POA: Diagnosis not present

## 2018-06-14 DIAGNOSIS — M8589 Other specified disorders of bone density and structure, multiple sites: Secondary | ICD-10-CM | POA: Diagnosis not present

## 2018-07-10 DIAGNOSIS — J449 Chronic obstructive pulmonary disease, unspecified: Secondary | ICD-10-CM | POA: Diagnosis not present

## 2018-07-13 DIAGNOSIS — H401112 Primary open-angle glaucoma, right eye, moderate stage: Secondary | ICD-10-CM | POA: Diagnosis not present

## 2018-07-13 DIAGNOSIS — H401123 Primary open-angle glaucoma, left eye, severe stage: Secondary | ICD-10-CM | POA: Diagnosis not present

## 2018-08-09 DIAGNOSIS — J449 Chronic obstructive pulmonary disease, unspecified: Secondary | ICD-10-CM | POA: Diagnosis not present

## 2018-09-09 DIAGNOSIS — J449 Chronic obstructive pulmonary disease, unspecified: Secondary | ICD-10-CM | POA: Diagnosis not present

## 2018-10-03 DIAGNOSIS — E1100 Type 2 diabetes mellitus with hyperosmolarity without nonketotic hyperglycemic-hyperosmolar coma (NKHHC): Secondary | ICD-10-CM | POA: Diagnosis not present

## 2018-10-03 DIAGNOSIS — Z0000 Encounter for general adult medical examination without abnormal findings: Secondary | ICD-10-CM | POA: Diagnosis not present

## 2018-10-03 DIAGNOSIS — D649 Anemia, unspecified: Secondary | ICD-10-CM | POA: Diagnosis not present

## 2018-10-03 DIAGNOSIS — I10 Essential (primary) hypertension: Secondary | ICD-10-CM | POA: Diagnosis not present

## 2018-10-09 DIAGNOSIS — Z79899 Other long term (current) drug therapy: Secondary | ICD-10-CM | POA: Diagnosis not present

## 2018-10-09 DIAGNOSIS — J441 Chronic obstructive pulmonary disease with (acute) exacerbation: Secondary | ICD-10-CM | POA: Diagnosis not present

## 2018-10-09 DIAGNOSIS — I119 Hypertensive heart disease without heart failure: Secondary | ICD-10-CM | POA: Diagnosis not present

## 2018-10-09 DIAGNOSIS — E559 Vitamin D deficiency, unspecified: Secondary | ICD-10-CM | POA: Diagnosis not present

## 2018-10-09 DIAGNOSIS — J449 Chronic obstructive pulmonary disease, unspecified: Secondary | ICD-10-CM | POA: Diagnosis not present

## 2018-10-09 DIAGNOSIS — E7800 Pure hypercholesterolemia, unspecified: Secondary | ICD-10-CM | POA: Diagnosis not present

## 2018-10-18 DIAGNOSIS — J449 Chronic obstructive pulmonary disease, unspecified: Secondary | ICD-10-CM | POA: Diagnosis not present

## 2018-10-18 DIAGNOSIS — R0602 Shortness of breath: Secondary | ICD-10-CM | POA: Diagnosis not present

## 2018-10-18 DIAGNOSIS — I350 Nonrheumatic aortic (valve) stenosis: Secondary | ICD-10-CM | POA: Diagnosis not present

## 2018-10-18 DIAGNOSIS — I272 Pulmonary hypertension, unspecified: Secondary | ICD-10-CM | POA: Diagnosis not present

## 2018-10-30 DIAGNOSIS — E782 Mixed hyperlipidemia: Secondary | ICD-10-CM | POA: Diagnosis not present

## 2018-11-07 DIAGNOSIS — M1250 Traumatic arthropathy, unspecified site: Secondary | ICD-10-CM | POA: Diagnosis not present

## 2018-11-07 DIAGNOSIS — E1100 Type 2 diabetes mellitus with hyperosmolarity without nonketotic hyperglycemic-hyperosmolar coma (NKHHC): Secondary | ICD-10-CM | POA: Diagnosis not present

## 2018-11-07 DIAGNOSIS — I10 Essential (primary) hypertension: Secondary | ICD-10-CM | POA: Diagnosis not present

## 2018-11-09 DIAGNOSIS — J449 Chronic obstructive pulmonary disease, unspecified: Secondary | ICD-10-CM | POA: Diagnosis not present

## 2018-12-10 DIAGNOSIS — J449 Chronic obstructive pulmonary disease, unspecified: Secondary | ICD-10-CM | POA: Diagnosis not present

## 2018-12-14 ENCOUNTER — Other Ambulatory Visit: Payer: Self-pay | Admitting: Cardiology

## 2018-12-14 DIAGNOSIS — I350 Nonrheumatic aortic (valve) stenosis: Secondary | ICD-10-CM

## 2019-01-08 DIAGNOSIS — J449 Chronic obstructive pulmonary disease, unspecified: Secondary | ICD-10-CM | POA: Diagnosis not present

## 2019-01-15 DIAGNOSIS — J441 Chronic obstructive pulmonary disease with (acute) exacerbation: Secondary | ICD-10-CM | POA: Diagnosis not present

## 2019-01-15 DIAGNOSIS — R7302 Impaired glucose tolerance (oral): Secondary | ICD-10-CM | POA: Diagnosis not present

## 2019-01-15 DIAGNOSIS — I119 Hypertensive heart disease without heart failure: Secondary | ICD-10-CM | POA: Diagnosis not present

## 2019-01-15 DIAGNOSIS — E7800 Pure hypercholesterolemia, unspecified: Secondary | ICD-10-CM | POA: Diagnosis not present

## 2019-01-15 DIAGNOSIS — Z79899 Other long term (current) drug therapy: Secondary | ICD-10-CM | POA: Diagnosis not present

## 2019-01-15 DIAGNOSIS — E559 Vitamin D deficiency, unspecified: Secondary | ICD-10-CM | POA: Diagnosis not present

## 2019-01-16 DIAGNOSIS — I119 Hypertensive heart disease without heart failure: Secondary | ICD-10-CM | POA: Diagnosis not present

## 2019-01-16 DIAGNOSIS — E559 Vitamin D deficiency, unspecified: Secondary | ICD-10-CM | POA: Diagnosis not present

## 2019-01-16 DIAGNOSIS — J441 Chronic obstructive pulmonary disease with (acute) exacerbation: Secondary | ICD-10-CM | POA: Diagnosis not present

## 2019-01-16 DIAGNOSIS — E7800 Pure hypercholesterolemia, unspecified: Secondary | ICD-10-CM | POA: Diagnosis not present

## 2019-01-23 ENCOUNTER — Other Ambulatory Visit: Payer: Self-pay

## 2019-01-29 DIAGNOSIS — M130 Polyarthritis, unspecified: Secondary | ICD-10-CM | POA: Diagnosis not present

## 2019-01-29 DIAGNOSIS — M940 Chondrocostal junction syndrome [Tietze]: Secondary | ICD-10-CM | POA: Diagnosis not present

## 2019-01-29 DIAGNOSIS — E1169 Type 2 diabetes mellitus with other specified complication: Secondary | ICD-10-CM | POA: Diagnosis not present

## 2019-02-06 DIAGNOSIS — J449 Chronic obstructive pulmonary disease, unspecified: Secondary | ICD-10-CM | POA: Diagnosis not present

## 2019-02-06 DIAGNOSIS — R0602 Shortness of breath: Secondary | ICD-10-CM | POA: Diagnosis not present

## 2019-02-06 DIAGNOSIS — I10 Essential (primary) hypertension: Secondary | ICD-10-CM | POA: Diagnosis not present

## 2019-02-06 DIAGNOSIS — E1169 Type 2 diabetes mellitus with other specified complication: Secondary | ICD-10-CM | POA: Diagnosis not present

## 2019-02-08 DIAGNOSIS — J449 Chronic obstructive pulmonary disease, unspecified: Secondary | ICD-10-CM | POA: Diagnosis not present

## 2019-02-20 DIAGNOSIS — E1165 Type 2 diabetes mellitus with hyperglycemia: Secondary | ICD-10-CM | POA: Diagnosis not present

## 2019-02-20 DIAGNOSIS — J441 Chronic obstructive pulmonary disease with (acute) exacerbation: Secondary | ICD-10-CM | POA: Diagnosis not present

## 2019-02-20 DIAGNOSIS — Z79899 Other long term (current) drug therapy: Secondary | ICD-10-CM | POA: Diagnosis not present

## 2019-02-20 DIAGNOSIS — E7800 Pure hypercholesterolemia, unspecified: Secondary | ICD-10-CM | POA: Diagnosis not present

## 2019-02-20 DIAGNOSIS — R0600 Dyspnea, unspecified: Secondary | ICD-10-CM | POA: Diagnosis not present

## 2019-02-20 DIAGNOSIS — Z139 Encounter for screening, unspecified: Secondary | ICD-10-CM | POA: Diagnosis not present

## 2019-02-20 DIAGNOSIS — I119 Hypertensive heart disease without heart failure: Secondary | ICD-10-CM | POA: Diagnosis not present

## 2019-02-20 DIAGNOSIS — E559 Vitamin D deficiency, unspecified: Secondary | ICD-10-CM | POA: Diagnosis not present

## 2019-02-21 ENCOUNTER — Ambulatory Visit
Admission: RE | Admit: 2019-02-21 | Discharge: 2019-02-21 | Disposition: A | Discharge status: Home / Self Care | Payer: Medicare Other | Source: Ambulatory Visit | Attending: Pulmonary Disease | Admitting: Pulmonary Disease

## 2019-02-21 ENCOUNTER — Other Ambulatory Visit: Payer: Self-pay | Admitting: Pulmonary Disease

## 2019-02-21 DIAGNOSIS — R0609 Other forms of dyspnea: Principal | ICD-10-CM

## 2019-02-21 DIAGNOSIS — R059 Cough, unspecified: Secondary | ICD-10-CM

## 2019-02-21 DIAGNOSIS — R05 Cough: Secondary | ICD-10-CM

## 2019-02-21 DIAGNOSIS — R0600 Dyspnea, unspecified: Secondary | ICD-10-CM

## 2019-02-21 DIAGNOSIS — R0602 Shortness of breath: Secondary | ICD-10-CM | POA: Diagnosis not present

## 2019-03-10 DIAGNOSIS — J449 Chronic obstructive pulmonary disease, unspecified: Secondary | ICD-10-CM | POA: Diagnosis not present

## 2019-03-12 DIAGNOSIS — R0602 Shortness of breath: Secondary | ICD-10-CM | POA: Diagnosis not present

## 2019-03-14 ENCOUNTER — Other Ambulatory Visit: Payer: Self-pay

## 2019-03-28 ENCOUNTER — Other Ambulatory Visit: Payer: Self-pay

## 2019-03-30 ENCOUNTER — Other Ambulatory Visit: Payer: Self-pay

## 2019-03-30 ENCOUNTER — Ambulatory Visit (INDEPENDENT_AMBULATORY_CARE_PROVIDER_SITE_OTHER): Payer: Medicare Other

## 2019-03-30 DIAGNOSIS — I350 Nonrheumatic aortic (valve) stenosis: Secondary | ICD-10-CM

## 2019-04-10 DIAGNOSIS — J449 Chronic obstructive pulmonary disease, unspecified: Secondary | ICD-10-CM | POA: Diagnosis not present

## 2019-04-16 DIAGNOSIS — J441 Chronic obstructive pulmonary disease with (acute) exacerbation: Secondary | ICD-10-CM | POA: Diagnosis not present

## 2019-04-16 DIAGNOSIS — E559 Vitamin D deficiency, unspecified: Secondary | ICD-10-CM | POA: Diagnosis not present

## 2019-04-16 DIAGNOSIS — R0609 Other forms of dyspnea: Secondary | ICD-10-CM | POA: Diagnosis not present

## 2019-04-16 DIAGNOSIS — J181 Lobar pneumonia, unspecified organism: Secondary | ICD-10-CM | POA: Diagnosis not present

## 2019-04-16 DIAGNOSIS — R7302 Impaired glucose tolerance (oral): Secondary | ICD-10-CM | POA: Diagnosis not present

## 2019-04-16 DIAGNOSIS — E1165 Type 2 diabetes mellitus with hyperglycemia: Secondary | ICD-10-CM | POA: Diagnosis not present

## 2019-04-16 DIAGNOSIS — Z79899 Other long term (current) drug therapy: Secondary | ICD-10-CM | POA: Diagnosis not present

## 2019-04-16 DIAGNOSIS — E7800 Pure hypercholesterolemia, unspecified: Secondary | ICD-10-CM | POA: Diagnosis not present

## 2019-04-16 DIAGNOSIS — I119 Hypertensive heart disease without heart failure: Secondary | ICD-10-CM | POA: Diagnosis not present

## 2019-04-17 ENCOUNTER — Other Ambulatory Visit: Payer: Self-pay | Admitting: Pulmonary Disease

## 2019-04-17 ENCOUNTER — Ambulatory Visit
Admission: RE | Admit: 2019-04-17 | Discharge: 2019-04-17 | Disposition: A | Discharge status: Home / Self Care | Payer: Medicare Other | Source: Ambulatory Visit | Attending: Pulmonary Disease | Admitting: Pulmonary Disease

## 2019-04-17 DIAGNOSIS — J449 Chronic obstructive pulmonary disease, unspecified: Secondary | ICD-10-CM

## 2019-04-17 DIAGNOSIS — R0602 Shortness of breath: Secondary | ICD-10-CM | POA: Diagnosis not present

## 2019-04-18 ENCOUNTER — Ambulatory Visit: Payer: Self-pay | Admitting: Cardiology

## 2019-04-18 ENCOUNTER — Ambulatory Visit (INDEPENDENT_AMBULATORY_CARE_PROVIDER_SITE_OTHER): Payer: Medicare Other | Admitting: Cardiology

## 2019-04-18 ENCOUNTER — Other Ambulatory Visit: Payer: Self-pay

## 2019-04-18 ENCOUNTER — Encounter: Payer: Self-pay | Admitting: Cardiology

## 2019-04-18 VITALS — BP 160/82 | HR 87 | Ht 62.0 in | Wt 145.0 lb

## 2019-04-18 DIAGNOSIS — I272 Pulmonary hypertension, unspecified: Secondary | ICD-10-CM | POA: Diagnosis not present

## 2019-04-18 DIAGNOSIS — I10 Essential (primary) hypertension: Secondary | ICD-10-CM

## 2019-04-18 DIAGNOSIS — J449 Chronic obstructive pulmonary disease, unspecified: Secondary | ICD-10-CM | POA: Diagnosis not present

## 2019-04-18 MED ORDER — HYDROCHLOROTHIAZIDE 12.5 MG PO CAPS: 12.5000 mg | ORAL_CAPSULE | Freq: Every day | ORAL | 3 refills | Status: DC

## 2019-04-18 NOTE — Progress Notes (Signed)
   Telephone visit note  Subjective:   Sheri [name], female    DOB: 1926/07/21, 85 y.o.   MRN: [id number]   I connected with the patient on 04/18/19 by a telephone call and verified that I am speaking with the correct person using two identifiers.     This visit type was conducted due to national recommendations for restrictions regarding the COVID-19 Pandemic (e.g. social distancing).  This format is felt to be most appropriate for this patient at this time.  All issues noted in this document were discussed and addressed.  No physical exam was performed (except for noted visual exam findings with Tele health visits).  The patient has consented to conduct a Tele health visit and understands insurance will be billed.   Chief complaint:  Shortness of breath  HPI  85 year old African-American female with hypertension, former smoker, moderate to severe COPD, WHO group 3 pulmonary hypertension, here for follow-up.  She continues to have stable shortness of breath. She is on management for COPD through Dr. Elsworth Soho. She is contemplating home oxygen use.  Blood pressure is elevated. She recently underwent labwok through PCP. I do not have the results available.   Echocardiogram 03/30/2019: Normal LV systolic function with EF 56%. Left ventricle cavity is normal in size. Mild concentric hypertrophy of the left ventricle. Hyperdynamic LV. Mild LVOT gradient likely due to hyperdynamic LV. No SAM seen.  Doppler evidence of grade I (impaired) diastolic dysfunction, normal LAP. Calculated EF 56%. Trileaflet aortic valve. Mild calcification. No significant valvular stenosis.  Mild (Grade I) aortic regurgitation. Moderate tricuspid regurgitation. Estimated pulmonary artery systolic pressure 49 mmHg. RVSP measures 49 mmHg. No significant change compared to previous study on 01/20/2018.   Discussed echcoardiogram results with the patient. Stable pulmonary hypertension, WHO grp III.  Continue current  medical therapy, including management of COPD. Added HCTZ 12.5 mg dor hypertension management.  Return office visit in 3 months.  Time spent: 12 min  Hooks, MD [facility]. [place] Pager: [phone] Office: [phone] If no answer Cell [phone]

## 2019-04-25 DIAGNOSIS — J449 Chronic obstructive pulmonary disease, unspecified: Secondary | ICD-10-CM | POA: Diagnosis not present

## 2019-04-25 DIAGNOSIS — I10 Essential (primary) hypertension: Secondary | ICD-10-CM | POA: Diagnosis not present

## 2019-05-10 DIAGNOSIS — J449 Chronic obstructive pulmonary disease, unspecified: Secondary | ICD-10-CM | POA: Diagnosis not present

## 2019-06-07 ENCOUNTER — Ambulatory Visit
Admission: RE | Admit: 2019-06-07 | Discharge: 2019-06-07 | Disposition: A | Discharge status: Home / Self Care | Payer: Medicare Other | Source: Ambulatory Visit | Attending: Family Medicine | Admitting: Family Medicine

## 2019-06-07 ENCOUNTER — Other Ambulatory Visit: Payer: Self-pay | Admitting: Family Medicine

## 2019-06-07 DIAGNOSIS — W19XXXA Unspecified fall, initial encounter: Secondary | ICD-10-CM

## 2019-06-07 DIAGNOSIS — R0602 Shortness of breath: Secondary | ICD-10-CM | POA: Diagnosis not present

## 2019-06-07 DIAGNOSIS — S0990XA Unspecified injury of head, initial encounter: Secondary | ICD-10-CM | POA: Diagnosis not present

## 2019-06-10 DIAGNOSIS — J449 Chronic obstructive pulmonary disease, unspecified: Secondary | ICD-10-CM | POA: Diagnosis not present

## 2019-06-11 DIAGNOSIS — J449 Chronic obstructive pulmonary disease, unspecified: Secondary | ICD-10-CM | POA: Diagnosis not present

## 2019-06-11 DIAGNOSIS — R531 Weakness: Secondary | ICD-10-CM | POA: Diagnosis not present

## 2019-06-11 DIAGNOSIS — I10 Essential (primary) hypertension: Secondary | ICD-10-CM | POA: Diagnosis not present

## 2019-07-02 DIAGNOSIS — R531 Weakness: Secondary | ICD-10-CM | POA: Diagnosis not present

## 2019-07-06 DIAGNOSIS — J449 Chronic obstructive pulmonary disease, unspecified: Secondary | ICD-10-CM | POA: Diagnosis not present

## 2019-07-06 DIAGNOSIS — R531 Weakness: Secondary | ICD-10-CM | POA: Diagnosis not present

## 2019-07-06 DIAGNOSIS — Z9181 History of falling: Secondary | ICD-10-CM | POA: Diagnosis not present

## 2019-07-10 DIAGNOSIS — Z7951 Long term (current) use of inhaled steroids: Secondary | ICD-10-CM | POA: Diagnosis not present

## 2019-07-10 DIAGNOSIS — I10 Essential (primary) hypertension: Secondary | ICD-10-CM | POA: Diagnosis not present

## 2019-07-10 DIAGNOSIS — H409 Unspecified glaucoma: Secondary | ICD-10-CM | POA: Diagnosis not present

## 2019-07-10 DIAGNOSIS — I272 Pulmonary hypertension, unspecified: Secondary | ICD-10-CM | POA: Diagnosis not present

## 2019-07-10 DIAGNOSIS — J449 Chronic obstructive pulmonary disease, unspecified: Secondary | ICD-10-CM | POA: Diagnosis not present

## 2019-07-10 DIAGNOSIS — Z9181 History of falling: Secondary | ICD-10-CM | POA: Diagnosis not present

## 2019-07-10 DIAGNOSIS — Z7952 Long term (current) use of systemic steroids: Secondary | ICD-10-CM | POA: Diagnosis not present

## 2019-07-10 DIAGNOSIS — M1990 Unspecified osteoarthritis, unspecified site: Secondary | ICD-10-CM | POA: Diagnosis not present

## 2019-07-10 DIAGNOSIS — R269 Unspecified abnormalities of gait and mobility: Secondary | ICD-10-CM | POA: Diagnosis not present

## 2019-07-10 DIAGNOSIS — Z87891 Personal history of nicotine dependence: Secondary | ICD-10-CM | POA: Diagnosis not present

## 2019-07-10 DIAGNOSIS — J9611 Chronic respiratory failure with hypoxia: Secondary | ICD-10-CM | POA: Diagnosis not present

## 2019-07-11 DIAGNOSIS — J449 Chronic obstructive pulmonary disease, unspecified: Secondary | ICD-10-CM | POA: Diagnosis not present

## 2019-07-13 DIAGNOSIS — R269 Unspecified abnormalities of gait and mobility: Secondary | ICD-10-CM | POA: Diagnosis not present

## 2019-07-13 DIAGNOSIS — I272 Pulmonary hypertension, unspecified: Secondary | ICD-10-CM | POA: Diagnosis not present

## 2019-07-13 DIAGNOSIS — Z7951 Long term (current) use of inhaled steroids: Secondary | ICD-10-CM | POA: Diagnosis not present

## 2019-07-13 DIAGNOSIS — J9611 Chronic respiratory failure with hypoxia: Secondary | ICD-10-CM | POA: Diagnosis not present

## 2019-07-13 DIAGNOSIS — J449 Chronic obstructive pulmonary disease, unspecified: Secondary | ICD-10-CM | POA: Diagnosis not present

## 2019-07-13 DIAGNOSIS — Z9181 History of falling: Secondary | ICD-10-CM | POA: Diagnosis not present

## 2019-07-13 DIAGNOSIS — Z87891 Personal history of nicotine dependence: Secondary | ICD-10-CM | POA: Diagnosis not present

## 2019-07-13 DIAGNOSIS — I10 Essential (primary) hypertension: Secondary | ICD-10-CM | POA: Diagnosis not present

## 2019-07-13 DIAGNOSIS — H409 Unspecified glaucoma: Secondary | ICD-10-CM | POA: Diagnosis not present

## 2019-07-13 DIAGNOSIS — M1990 Unspecified osteoarthritis, unspecified site: Secondary | ICD-10-CM | POA: Diagnosis not present

## 2019-07-13 DIAGNOSIS — Z7952 Long term (current) use of systemic steroids: Secondary | ICD-10-CM | POA: Diagnosis not present

## 2019-07-16 DIAGNOSIS — H409 Unspecified glaucoma: Secondary | ICD-10-CM | POA: Diagnosis not present

## 2019-07-16 DIAGNOSIS — J9611 Chronic respiratory failure with hypoxia: Secondary | ICD-10-CM | POA: Diagnosis not present

## 2019-07-16 DIAGNOSIS — I272 Pulmonary hypertension, unspecified: Secondary | ICD-10-CM | POA: Diagnosis not present

## 2019-07-16 DIAGNOSIS — I10 Essential (primary) hypertension: Secondary | ICD-10-CM | POA: Diagnosis not present

## 2019-07-16 DIAGNOSIS — J449 Chronic obstructive pulmonary disease, unspecified: Secondary | ICD-10-CM | POA: Diagnosis not present

## 2019-07-16 DIAGNOSIS — Z87891 Personal history of nicotine dependence: Secondary | ICD-10-CM | POA: Diagnosis not present

## 2019-07-16 DIAGNOSIS — R42 Dizziness and giddiness: Secondary | ICD-10-CM | POA: Diagnosis not present

## 2019-07-16 DIAGNOSIS — J441 Chronic obstructive pulmonary disease with (acute) exacerbation: Secondary | ICD-10-CM | POA: Diagnosis not present

## 2019-07-16 DIAGNOSIS — M1990 Unspecified osteoarthritis, unspecified site: Secondary | ICD-10-CM | POA: Diagnosis not present

## 2019-07-16 DIAGNOSIS — Z9181 History of falling: Secondary | ICD-10-CM | POA: Diagnosis not present

## 2019-07-16 DIAGNOSIS — E1165 Type 2 diabetes mellitus with hyperglycemia: Secondary | ICD-10-CM | POA: Diagnosis not present

## 2019-07-16 DIAGNOSIS — Z7952 Long term (current) use of systemic steroids: Secondary | ICD-10-CM | POA: Diagnosis not present

## 2019-07-16 DIAGNOSIS — E7800 Pure hypercholesterolemia, unspecified: Secondary | ICD-10-CM | POA: Diagnosis not present

## 2019-07-16 DIAGNOSIS — R269 Unspecified abnormalities of gait and mobility: Secondary | ICD-10-CM | POA: Diagnosis not present

## 2019-07-16 DIAGNOSIS — J181 Lobar pneumonia, unspecified organism: Secondary | ICD-10-CM | POA: Diagnosis not present

## 2019-07-16 DIAGNOSIS — Z7951 Long term (current) use of inhaled steroids: Secondary | ICD-10-CM | POA: Diagnosis not present

## 2019-07-17 DIAGNOSIS — Z9181 History of falling: Secondary | ICD-10-CM | POA: Diagnosis not present

## 2019-07-17 DIAGNOSIS — J449 Chronic obstructive pulmonary disease, unspecified: Secondary | ICD-10-CM | POA: Diagnosis not present

## 2019-07-17 DIAGNOSIS — J9611 Chronic respiratory failure with hypoxia: Secondary | ICD-10-CM | POA: Diagnosis not present

## 2019-07-17 DIAGNOSIS — Z7951 Long term (current) use of inhaled steroids: Secondary | ICD-10-CM | POA: Diagnosis not present

## 2019-07-17 DIAGNOSIS — H409 Unspecified glaucoma: Secondary | ICD-10-CM | POA: Diagnosis not present

## 2019-07-17 DIAGNOSIS — I10 Essential (primary) hypertension: Secondary | ICD-10-CM | POA: Diagnosis not present

## 2019-07-17 DIAGNOSIS — Z87891 Personal history of nicotine dependence: Secondary | ICD-10-CM | POA: Diagnosis not present

## 2019-07-17 DIAGNOSIS — I272 Pulmonary hypertension, unspecified: Secondary | ICD-10-CM | POA: Diagnosis not present

## 2019-07-17 DIAGNOSIS — M1990 Unspecified osteoarthritis, unspecified site: Secondary | ICD-10-CM | POA: Diagnosis not present

## 2019-07-17 DIAGNOSIS — R269 Unspecified abnormalities of gait and mobility: Secondary | ICD-10-CM | POA: Diagnosis not present

## 2019-07-17 DIAGNOSIS — Z7952 Long term (current) use of systemic steroids: Secondary | ICD-10-CM | POA: Diagnosis not present

## 2019-07-19 DIAGNOSIS — I272 Pulmonary hypertension, unspecified: Secondary | ICD-10-CM | POA: Diagnosis not present

## 2019-07-19 DIAGNOSIS — R269 Unspecified abnormalities of gait and mobility: Secondary | ICD-10-CM | POA: Diagnosis not present

## 2019-07-19 DIAGNOSIS — H409 Unspecified glaucoma: Secondary | ICD-10-CM | POA: Diagnosis not present

## 2019-07-19 DIAGNOSIS — I10 Essential (primary) hypertension: Secondary | ICD-10-CM | POA: Diagnosis not present

## 2019-07-19 DIAGNOSIS — J449 Chronic obstructive pulmonary disease, unspecified: Secondary | ICD-10-CM | POA: Diagnosis not present

## 2019-07-19 DIAGNOSIS — Z9181 History of falling: Secondary | ICD-10-CM | POA: Diagnosis not present

## 2019-07-19 DIAGNOSIS — M1990 Unspecified osteoarthritis, unspecified site: Secondary | ICD-10-CM | POA: Diagnosis not present

## 2019-07-19 DIAGNOSIS — J9611 Chronic respiratory failure with hypoxia: Secondary | ICD-10-CM | POA: Diagnosis not present

## 2019-07-19 DIAGNOSIS — Z7951 Long term (current) use of inhaled steroids: Secondary | ICD-10-CM | POA: Diagnosis not present

## 2019-07-19 DIAGNOSIS — Z7952 Long term (current) use of systemic steroids: Secondary | ICD-10-CM | POA: Diagnosis not present

## 2019-07-19 DIAGNOSIS — Z87891 Personal history of nicotine dependence: Secondary | ICD-10-CM | POA: Diagnosis not present

## 2019-07-24 DIAGNOSIS — Z9181 History of falling: Secondary | ICD-10-CM | POA: Diagnosis not present

## 2019-07-24 DIAGNOSIS — I272 Pulmonary hypertension, unspecified: Secondary | ICD-10-CM | POA: Diagnosis not present

## 2019-07-24 DIAGNOSIS — Z87891 Personal history of nicotine dependence: Secondary | ICD-10-CM | POA: Diagnosis not present

## 2019-07-24 DIAGNOSIS — R269 Unspecified abnormalities of gait and mobility: Secondary | ICD-10-CM | POA: Diagnosis not present

## 2019-07-24 DIAGNOSIS — Z7951 Long term (current) use of inhaled steroids: Secondary | ICD-10-CM | POA: Diagnosis not present

## 2019-07-24 DIAGNOSIS — J449 Chronic obstructive pulmonary disease, unspecified: Secondary | ICD-10-CM | POA: Diagnosis not present

## 2019-07-24 DIAGNOSIS — Z7952 Long term (current) use of systemic steroids: Secondary | ICD-10-CM | POA: Diagnosis not present

## 2019-07-24 DIAGNOSIS — H409 Unspecified glaucoma: Secondary | ICD-10-CM | POA: Diagnosis not present

## 2019-07-24 DIAGNOSIS — I10 Essential (primary) hypertension: Secondary | ICD-10-CM | POA: Diagnosis not present

## 2019-07-24 DIAGNOSIS — M1990 Unspecified osteoarthritis, unspecified site: Secondary | ICD-10-CM | POA: Diagnosis not present

## 2019-07-24 DIAGNOSIS — J9611 Chronic respiratory failure with hypoxia: Secondary | ICD-10-CM | POA: Diagnosis not present

## 2019-07-25 DIAGNOSIS — Z87891 Personal history of nicotine dependence: Secondary | ICD-10-CM | POA: Diagnosis not present

## 2019-07-25 DIAGNOSIS — I10 Essential (primary) hypertension: Secondary | ICD-10-CM | POA: Diagnosis not present

## 2019-07-25 DIAGNOSIS — M1990 Unspecified osteoarthritis, unspecified site: Secondary | ICD-10-CM | POA: Diagnosis not present

## 2019-07-25 DIAGNOSIS — J9611 Chronic respiratory failure with hypoxia: Secondary | ICD-10-CM | POA: Diagnosis not present

## 2019-07-25 DIAGNOSIS — Z7951 Long term (current) use of inhaled steroids: Secondary | ICD-10-CM | POA: Diagnosis not present

## 2019-07-25 DIAGNOSIS — Z7952 Long term (current) use of systemic steroids: Secondary | ICD-10-CM | POA: Diagnosis not present

## 2019-07-25 DIAGNOSIS — H409 Unspecified glaucoma: Secondary | ICD-10-CM | POA: Diagnosis not present

## 2019-07-25 DIAGNOSIS — R269 Unspecified abnormalities of gait and mobility: Secondary | ICD-10-CM | POA: Diagnosis not present

## 2019-07-25 DIAGNOSIS — Z9181 History of falling: Secondary | ICD-10-CM | POA: Diagnosis not present

## 2019-07-25 DIAGNOSIS — J449 Chronic obstructive pulmonary disease, unspecified: Secondary | ICD-10-CM | POA: Diagnosis not present

## 2019-07-25 DIAGNOSIS — I272 Pulmonary hypertension, unspecified: Secondary | ICD-10-CM | POA: Diagnosis not present

## 2019-07-26 ENCOUNTER — Other Ambulatory Visit (HOSPITAL_COMMUNITY): Payer: Self-pay | Admitting: Family Medicine

## 2019-07-26 ENCOUNTER — Other Ambulatory Visit: Payer: Self-pay | Admitting: Family Medicine

## 2019-07-26 DIAGNOSIS — R1310 Dysphagia, unspecified: Secondary | ICD-10-CM

## 2019-07-26 DIAGNOSIS — E1169 Type 2 diabetes mellitus with other specified complication: Secondary | ICD-10-CM | POA: Diagnosis not present

## 2019-07-26 DIAGNOSIS — I10 Essential (primary) hypertension: Secondary | ICD-10-CM | POA: Diagnosis not present

## 2019-07-26 DIAGNOSIS — R130 Aphagia: Secondary | ICD-10-CM | POA: Diagnosis not present

## 2019-07-30 DIAGNOSIS — J449 Chronic obstructive pulmonary disease, unspecified: Secondary | ICD-10-CM | POA: Diagnosis not present

## 2019-07-30 DIAGNOSIS — M1990 Unspecified osteoarthritis, unspecified site: Secondary | ICD-10-CM | POA: Diagnosis not present

## 2019-07-30 DIAGNOSIS — Z7952 Long term (current) use of systemic steroids: Secondary | ICD-10-CM | POA: Diagnosis not present

## 2019-07-30 DIAGNOSIS — Z9181 History of falling: Secondary | ICD-10-CM | POA: Diagnosis not present

## 2019-07-30 DIAGNOSIS — I272 Pulmonary hypertension, unspecified: Secondary | ICD-10-CM | POA: Diagnosis not present

## 2019-07-30 DIAGNOSIS — Z87891 Personal history of nicotine dependence: Secondary | ICD-10-CM | POA: Diagnosis not present

## 2019-07-30 DIAGNOSIS — I10 Essential (primary) hypertension: Secondary | ICD-10-CM | POA: Diagnosis not present

## 2019-07-30 DIAGNOSIS — H409 Unspecified glaucoma: Secondary | ICD-10-CM | POA: Diagnosis not present

## 2019-07-30 DIAGNOSIS — R269 Unspecified abnormalities of gait and mobility: Secondary | ICD-10-CM | POA: Diagnosis not present

## 2019-07-30 DIAGNOSIS — Z7951 Long term (current) use of inhaled steroids: Secondary | ICD-10-CM | POA: Diagnosis not present

## 2019-07-30 DIAGNOSIS — J9611 Chronic respiratory failure with hypoxia: Secondary | ICD-10-CM | POA: Diagnosis not present

## 2019-08-01 DIAGNOSIS — Z7952 Long term (current) use of systemic steroids: Secondary | ICD-10-CM | POA: Diagnosis not present

## 2019-08-01 DIAGNOSIS — J9611 Chronic respiratory failure with hypoxia: Secondary | ICD-10-CM | POA: Diagnosis not present

## 2019-08-01 DIAGNOSIS — I10 Essential (primary) hypertension: Secondary | ICD-10-CM | POA: Diagnosis not present

## 2019-08-01 DIAGNOSIS — Z87891 Personal history of nicotine dependence: Secondary | ICD-10-CM | POA: Diagnosis not present

## 2019-08-01 DIAGNOSIS — I272 Pulmonary hypertension, unspecified: Secondary | ICD-10-CM | POA: Diagnosis not present

## 2019-08-01 DIAGNOSIS — J449 Chronic obstructive pulmonary disease, unspecified: Secondary | ICD-10-CM | POA: Diagnosis not present

## 2019-08-01 DIAGNOSIS — Z9181 History of falling: Secondary | ICD-10-CM | POA: Diagnosis not present

## 2019-08-01 DIAGNOSIS — Z7951 Long term (current) use of inhaled steroids: Secondary | ICD-10-CM | POA: Diagnosis not present

## 2019-08-01 DIAGNOSIS — H409 Unspecified glaucoma: Secondary | ICD-10-CM | POA: Diagnosis not present

## 2019-08-01 DIAGNOSIS — M1990 Unspecified osteoarthritis, unspecified site: Secondary | ICD-10-CM | POA: Diagnosis not present

## 2019-08-01 DIAGNOSIS — R269 Unspecified abnormalities of gait and mobility: Secondary | ICD-10-CM | POA: Diagnosis not present

## 2019-08-06 ENCOUNTER — Other Ambulatory Visit: Payer: Self-pay

## 2019-08-06 ENCOUNTER — Ambulatory Visit (HOSPITAL_COMMUNITY)
Admission: RE | Admit: 2019-08-06 | Discharge: 2019-08-06 | Disposition: A | Discharge status: Home / Self Care | Payer: Medicare Other | Source: Ambulatory Visit | Attending: Family Medicine | Admitting: Family Medicine

## 2019-08-06 DIAGNOSIS — K449 Diaphragmatic hernia without obstruction or gangrene: Secondary | ICD-10-CM | POA: Diagnosis not present

## 2019-08-06 DIAGNOSIS — R1310 Dysphagia, unspecified: Secondary | ICD-10-CM | POA: Diagnosis not present

## 2019-08-06 DIAGNOSIS — K224 Dyskinesia of esophagus: Secondary | ICD-10-CM | POA: Diagnosis not present

## 2019-08-07 DIAGNOSIS — Z9181 History of falling: Secondary | ICD-10-CM | POA: Diagnosis not present

## 2019-08-07 DIAGNOSIS — Z7951 Long term (current) use of inhaled steroids: Secondary | ICD-10-CM | POA: Diagnosis not present

## 2019-08-07 DIAGNOSIS — I10 Essential (primary) hypertension: Secondary | ICD-10-CM | POA: Diagnosis not present

## 2019-08-07 DIAGNOSIS — R269 Unspecified abnormalities of gait and mobility: Secondary | ICD-10-CM | POA: Diagnosis not present

## 2019-08-07 DIAGNOSIS — Z7952 Long term (current) use of systemic steroids: Secondary | ICD-10-CM | POA: Diagnosis not present

## 2019-08-07 DIAGNOSIS — I272 Pulmonary hypertension, unspecified: Secondary | ICD-10-CM | POA: Diagnosis not present

## 2019-08-07 DIAGNOSIS — M1990 Unspecified osteoarthritis, unspecified site: Secondary | ICD-10-CM | POA: Diagnosis not present

## 2019-08-07 DIAGNOSIS — J9611 Chronic respiratory failure with hypoxia: Secondary | ICD-10-CM | POA: Diagnosis not present

## 2019-08-07 DIAGNOSIS — H409 Unspecified glaucoma: Secondary | ICD-10-CM | POA: Diagnosis not present

## 2019-08-07 DIAGNOSIS — J449 Chronic obstructive pulmonary disease, unspecified: Secondary | ICD-10-CM | POA: Diagnosis not present

## 2019-08-07 DIAGNOSIS — Z87891 Personal history of nicotine dependence: Secondary | ICD-10-CM | POA: Diagnosis not present

## 2019-08-09 DIAGNOSIS — M130 Polyarthritis, unspecified: Secondary | ICD-10-CM | POA: Diagnosis not present

## 2019-08-09 DIAGNOSIS — H524 Presbyopia: Secondary | ICD-10-CM | POA: Diagnosis not present

## 2019-08-09 DIAGNOSIS — I10 Essential (primary) hypertension: Secondary | ICD-10-CM | POA: Diagnosis not present

## 2019-08-09 DIAGNOSIS — R1311 Dysphagia, oral phase: Secondary | ICD-10-CM | POA: Diagnosis not present

## 2019-08-10 DIAGNOSIS — J449 Chronic obstructive pulmonary disease, unspecified: Secondary | ICD-10-CM | POA: Diagnosis not present

## 2019-08-14 DIAGNOSIS — I10 Essential (primary) hypertension: Secondary | ICD-10-CM | POA: Diagnosis not present

## 2019-08-14 DIAGNOSIS — R269 Unspecified abnormalities of gait and mobility: Secondary | ICD-10-CM | POA: Diagnosis not present

## 2019-08-14 DIAGNOSIS — H409 Unspecified glaucoma: Secondary | ICD-10-CM | POA: Diagnosis not present

## 2019-08-14 DIAGNOSIS — Z7952 Long term (current) use of systemic steroids: Secondary | ICD-10-CM | POA: Diagnosis not present

## 2019-08-14 DIAGNOSIS — M1990 Unspecified osteoarthritis, unspecified site: Secondary | ICD-10-CM | POA: Diagnosis not present

## 2019-08-14 DIAGNOSIS — I272 Pulmonary hypertension, unspecified: Secondary | ICD-10-CM | POA: Diagnosis not present

## 2019-08-14 DIAGNOSIS — J449 Chronic obstructive pulmonary disease, unspecified: Secondary | ICD-10-CM | POA: Diagnosis not present

## 2019-08-14 DIAGNOSIS — Z9181 History of falling: Secondary | ICD-10-CM | POA: Diagnosis not present

## 2019-08-14 DIAGNOSIS — Z87891 Personal history of nicotine dependence: Secondary | ICD-10-CM | POA: Diagnosis not present

## 2019-08-14 DIAGNOSIS — J9611 Chronic respiratory failure with hypoxia: Secondary | ICD-10-CM | POA: Diagnosis not present

## 2019-08-14 DIAGNOSIS — Z7951 Long term (current) use of inhaled steroids: Secondary | ICD-10-CM | POA: Diagnosis not present

## 2019-08-21 DIAGNOSIS — I10 Essential (primary) hypertension: Secondary | ICD-10-CM | POA: Diagnosis not present

## 2019-08-21 DIAGNOSIS — Z7951 Long term (current) use of inhaled steroids: Secondary | ICD-10-CM | POA: Diagnosis not present

## 2019-08-21 DIAGNOSIS — Z87891 Personal history of nicotine dependence: Secondary | ICD-10-CM | POA: Diagnosis not present

## 2019-08-21 DIAGNOSIS — H409 Unspecified glaucoma: Secondary | ICD-10-CM | POA: Diagnosis not present

## 2019-08-21 DIAGNOSIS — M1990 Unspecified osteoarthritis, unspecified site: Secondary | ICD-10-CM | POA: Diagnosis not present

## 2019-08-21 DIAGNOSIS — R269 Unspecified abnormalities of gait and mobility: Secondary | ICD-10-CM | POA: Diagnosis not present

## 2019-08-21 DIAGNOSIS — Z9181 History of falling: Secondary | ICD-10-CM | POA: Diagnosis not present

## 2019-08-21 DIAGNOSIS — Z7952 Long term (current) use of systemic steroids: Secondary | ICD-10-CM | POA: Diagnosis not present

## 2019-08-21 DIAGNOSIS — J9611 Chronic respiratory failure with hypoxia: Secondary | ICD-10-CM | POA: Diagnosis not present

## 2019-08-21 DIAGNOSIS — J449 Chronic obstructive pulmonary disease, unspecified: Secondary | ICD-10-CM | POA: Diagnosis not present

## 2019-08-21 DIAGNOSIS — I272 Pulmonary hypertension, unspecified: Secondary | ICD-10-CM | POA: Diagnosis not present

## 2019-08-29 DIAGNOSIS — I10 Essential (primary) hypertension: Secondary | ICD-10-CM | POA: Diagnosis not present

## 2019-08-29 DIAGNOSIS — J449 Chronic obstructive pulmonary disease, unspecified: Secondary | ICD-10-CM | POA: Diagnosis not present

## 2019-08-29 DIAGNOSIS — H409 Unspecified glaucoma: Secondary | ICD-10-CM | POA: Diagnosis not present

## 2019-08-29 DIAGNOSIS — Z9181 History of falling: Secondary | ICD-10-CM | POA: Diagnosis not present

## 2019-08-29 DIAGNOSIS — I272 Pulmonary hypertension, unspecified: Secondary | ICD-10-CM | POA: Diagnosis not present

## 2019-08-29 DIAGNOSIS — Z7952 Long term (current) use of systemic steroids: Secondary | ICD-10-CM | POA: Diagnosis not present

## 2019-08-29 DIAGNOSIS — J9611 Chronic respiratory failure with hypoxia: Secondary | ICD-10-CM | POA: Diagnosis not present

## 2019-08-29 DIAGNOSIS — M1990 Unspecified osteoarthritis, unspecified site: Secondary | ICD-10-CM | POA: Diagnosis not present

## 2019-08-29 DIAGNOSIS — Z7951 Long term (current) use of inhaled steroids: Secondary | ICD-10-CM | POA: Diagnosis not present

## 2019-08-29 DIAGNOSIS — R269 Unspecified abnormalities of gait and mobility: Secondary | ICD-10-CM | POA: Diagnosis not present

## 2019-08-29 DIAGNOSIS — Z87891 Personal history of nicotine dependence: Secondary | ICD-10-CM | POA: Diagnosis not present

## 2019-09-10 DIAGNOSIS — J449 Chronic obstructive pulmonary disease, unspecified: Secondary | ICD-10-CM | POA: Diagnosis not present

## 2019-09-13 ENCOUNTER — Other Ambulatory Visit: Payer: Self-pay | Admitting: Cardiology

## 2019-09-13 DIAGNOSIS — I10 Essential (primary) hypertension: Secondary | ICD-10-CM

## 2019-09-24 ENCOUNTER — Other Ambulatory Visit: Payer: Self-pay

## 2019-09-24 NOTE — Patient Outreach (Signed)
[facility]) Care Management  09/24/2019  Sheri [name] October 30, 1926 [id number]   Medication Adherence call to Sheri [name] [facility] Identifiers Verify spoke with patient she is past due on Rosuvastatin 20 mg,patient explain she take 1 tablet daily,patient has a few she ask if we can call [facility] to order this medication,Pharmacy will have it ready for patient to pick up.Sheri [name] is showing past due under [name].   [place] Management Direct Dial [phone]  Fax [phone] [email or web address]

## 2019-09-26 DIAGNOSIS — M130 Polyarthritis, unspecified: Secondary | ICD-10-CM | POA: Diagnosis not present

## 2019-09-26 DIAGNOSIS — J9621 Acute and chronic respiratory failure with hypoxia: Secondary | ICD-10-CM | POA: Diagnosis not present

## 2019-09-26 DIAGNOSIS — J449 Chronic obstructive pulmonary disease, unspecified: Secondary | ICD-10-CM | POA: Diagnosis not present

## 2019-10-09 DIAGNOSIS — E7800 Pure hypercholesterolemia, unspecified: Secondary | ICD-10-CM | POA: Diagnosis not present

## 2019-10-09 DIAGNOSIS — J181 Lobar pneumonia, unspecified organism: Secondary | ICD-10-CM | POA: Diagnosis not present

## 2019-10-09 DIAGNOSIS — J441 Chronic obstructive pulmonary disease with (acute) exacerbation: Secondary | ICD-10-CM | POA: Diagnosis not present

## 2019-10-09 DIAGNOSIS — E559 Vitamin D deficiency, unspecified: Secondary | ICD-10-CM | POA: Diagnosis not present

## 2019-10-09 DIAGNOSIS — E1165 Type 2 diabetes mellitus with hyperglycemia: Secondary | ICD-10-CM | POA: Diagnosis not present

## 2019-10-10 DIAGNOSIS — J449 Chronic obstructive pulmonary disease, unspecified: Secondary | ICD-10-CM | POA: Diagnosis not present

## 2019-10-11 DIAGNOSIS — E1169 Type 2 diabetes mellitus with other specified complication: Secondary | ICD-10-CM | POA: Diagnosis not present

## 2019-10-11 DIAGNOSIS — E1143 Type 2 diabetes mellitus with diabetic autonomic (poly)neuropathy: Secondary | ICD-10-CM | POA: Diagnosis not present

## 2019-10-11 DIAGNOSIS — M130 Polyarthritis, unspecified: Secondary | ICD-10-CM | POA: Diagnosis not present

## 2019-10-17 ENCOUNTER — Ambulatory Visit: Payer: Medicare Other | Admitting: Cardiology

## 2019-11-01 DIAGNOSIS — M130 Polyarthritis, unspecified: Secondary | ICD-10-CM | POA: Diagnosis not present

## 2019-11-10 DIAGNOSIS — J449 Chronic obstructive pulmonary disease, unspecified: Secondary | ICD-10-CM | POA: Diagnosis not present

## 2019-11-12 ENCOUNTER — Encounter: Payer: Self-pay | Admitting: Cardiology

## 2019-11-12 ENCOUNTER — Other Ambulatory Visit: Payer: Self-pay

## 2019-11-12 ENCOUNTER — Ambulatory Visit: Payer: Medicare Other | Admitting: Cardiology

## 2019-11-12 VITALS — BP 172/84 | HR 85 | Temp 97.2°F | Ht 62.0 in | Wt 134.0 lb

## 2019-11-12 DIAGNOSIS — J449 Chronic obstructive pulmonary disease, unspecified: Secondary | ICD-10-CM | POA: Diagnosis not present

## 2019-11-12 DIAGNOSIS — I10 Essential (primary) hypertension: Secondary | ICD-10-CM

## 2019-11-12 DIAGNOSIS — I272 Pulmonary hypertension, unspecified: Secondary | ICD-10-CM | POA: Diagnosis not present

## 2019-11-12 MED ORDER — VERAPAMIL HCL ER 240 MG PO TBCR: 240.0000 mg | EXTENDED_RELEASE_TABLET | Freq: Every day | ORAL | 3 refills | Status: DC

## 2019-11-12 NOTE — Progress Notes (Signed)
Follow up visit  Subjective:   Sheri [name], female    DOB: [birth year], 86 y.o.   MRN: [id number]   HPI  Chief Complaint  Patient presents with   Hypertension   Follow-up    73 year    86 year old African-American female with hypertension, former smoker, moderate to severe COPD, WHO group 3 pulmonary hypertension.  She sees Dr. Wyona Almas for COPD, next appt in April. She has stable, uncahnged exertional dyspnea. She does not need oxygen. Blood pressure is elevated. She reports occasional dizziness.   Current Outpatient Medications on File Prior to Visit  Medication Sig Dispense Refill   amLODipine (NORVASC) 5 MG tablet Take by mouth.     diclofenac sodium (VOLTAREN) 1 % GEL as needed.     ferrous sulfate 325 (65 FE) MG tablet TAKE 1 TABLET BY MOUTH TWICE A DAY WITH A MEAL     gabapentin (NEURONTIN) 100 MG capsule 2 (two) times a day.     hydrochlorothiazide (MICROZIDE) 12.5 MG capsule TAKE 1 CAPSULE BY MOUTH EVERY DAY 90 capsule 2   JANUVIA 100 MG tablet daily.     mirtazapine (REMERON) 7.5 MG tablet every evening.  3   simvastatin (ZOCOR) 40 MG tablet Take by mouth.     SYMBICORT 160-4.5 MCG/ACT inhaler USE ONE INHALATION TWICE A DAY  5   Tiotropium Bromide Monohydrate (SPIRIVA RESPIMAT) 1.25 MCG/ACT AERS Inhale 2 puffs into the lungs daily. 1 Inhaler 0   Vitamin D, Ergocalciferol, (DRISDOL) 1.25 MG (50000 UT) CAPS capsule TAKE 1 CAPSULE BY MOUTH ONCE EVERY WEEK     No current facility-administered medications on file prior to visit.    Cardiovascular & other pertient studies:  EKG 11/12/2019: Sinus rhythm 81 bpm. Nonspecific T-abnormality.   Echocardiogram 03/30/2019: Normal LV systolic function with EF 56%. Left ventricle cavity is normal in size. Mild concentric hypertrophy of the left ventricle. Hyperdynamic LV. Mild LVOT gradient likely due to hyperdynamic LV. No SAM seen.  Doppler evidence of grade I (impaired) diastolic dysfunction, normal LAP.  Calculated EF 56%. Trileaflet aortic valve. Mild calcification. No significant valvular stenosis.  Mild (Grade I) aortic regurgitation. Moderate tricuspid regurgitation. Estimated pulmonary artery systolic pressure 49 mmHg. RVSP measures 49 mmHg. No significant change compared to previous study on 01/20/2018.   Recent labs: Not available.   Review of Systems  Cardiovascular: Positive for dyspnea on exertion. Negative for chest pain, leg swelling, palpitations and syncope.  Neurological: Positive for dizziness.         Vitals:   11/12/19 1610  BP: (!) 171/77  Pulse: 80  Temp: (!) 97.2 F (36.2 C)  SpO2: 93%    Body mass index is 24.51 kg/m. Filed Weights   11/12/19 1610  Weight: 134 lb (60.8 kg)     Objective:   Physical Exam  Constitutional: She appears well-developed and well-nourished.  Neck: No JVD present.  Cardiovascular: Normal rate, regular rhythm and intact distal pulses.  Murmur heard.  Systolic murmur is present with a grade of 2/6 at the upper right sternal border. Pulmonary/Chest: Effort normal and breath sounds normal. She has no wheezes. She has no rales.  Musculoskeletal:        General: No edema.  Nursing note and vitals reviewed.      Assessment & Recommendations:   86 year old African-American female with hypertension, former smoker, moderate to severe COPD, WHO group 3 pulmonary hypertension.  Exertional dyspnea: Likely due to COPD and WHO grp III PH. Continue COPD management.  Hypertension: Uncontrolled. She has mild LVOT obstruction. Switch amlodipine 5 mg to verapamil 240 mg daily.  F/u in 3 months.    Elder Negus, MD [facility] [place] Pager: [phone] Office: [phone]

## 2019-12-06 DIAGNOSIS — R1310 Dysphagia, unspecified: Secondary | ICD-10-CM | POA: Diagnosis not present

## 2019-12-06 DIAGNOSIS — Z8709 Personal history of other diseases of the respiratory system: Secondary | ICD-10-CM | POA: Diagnosis not present

## 2019-12-06 DIAGNOSIS — R933 Abnormal findings on diagnostic imaging of other parts of digestive tract: Secondary | ICD-10-CM | POA: Diagnosis not present

## 2019-12-11 DIAGNOSIS — J449 Chronic obstructive pulmonary disease, unspecified: Secondary | ICD-10-CM | POA: Diagnosis not present

## 2019-12-26 DIAGNOSIS — H16211 Exposure keratoconjunctivitis, right eye: Secondary | ICD-10-CM | POA: Diagnosis not present

## 2019-12-26 DIAGNOSIS — H1811 Bullous keratopathy, right eye: Secondary | ICD-10-CM | POA: Diagnosis not present

## 2019-12-26 DIAGNOSIS — H04123 Dry eye syndrome of bilateral lacrimal glands: Secondary | ICD-10-CM | POA: Diagnosis not present

## 2019-12-30 DIAGNOSIS — J449 Chronic obstructive pulmonary disease, unspecified: Secondary | ICD-10-CM | POA: Diagnosis not present

## 2019-12-30 DIAGNOSIS — I110 Hypertensive heart disease with heart failure: Secondary | ICD-10-CM | POA: Diagnosis not present

## 2019-12-30 DIAGNOSIS — E1143 Type 2 diabetes mellitus with diabetic autonomic (poly)neuropathy: Secondary | ICD-10-CM | POA: Diagnosis not present

## 2019-12-30 DIAGNOSIS — I509 Heart failure, unspecified: Secondary | ICD-10-CM | POA: Diagnosis not present

## 2020-01-08 DIAGNOSIS — J449 Chronic obstructive pulmonary disease, unspecified: Secondary | ICD-10-CM | POA: Diagnosis not present

## 2020-01-22 DIAGNOSIS — H401123 Primary open-angle glaucoma, left eye, severe stage: Secondary | ICD-10-CM | POA: Diagnosis not present

## 2020-01-22 DIAGNOSIS — H401112 Primary open-angle glaucoma, right eye, moderate stage: Secondary | ICD-10-CM | POA: Diagnosis not present

## 2020-01-22 DIAGNOSIS — H04123 Dry eye syndrome of bilateral lacrimal glands: Secondary | ICD-10-CM | POA: Diagnosis not present

## 2020-01-22 DIAGNOSIS — H35033 Hypertensive retinopathy, bilateral: Secondary | ICD-10-CM | POA: Diagnosis not present

## 2020-01-31 DIAGNOSIS — I10 Essential (primary) hypertension: Secondary | ICD-10-CM | POA: Diagnosis not present

## 2020-01-31 DIAGNOSIS — E1169 Type 2 diabetes mellitus with other specified complication: Secondary | ICD-10-CM | POA: Diagnosis not present

## 2020-01-31 DIAGNOSIS — M130 Polyarthritis, unspecified: Secondary | ICD-10-CM | POA: Diagnosis not present

## 2020-01-31 DIAGNOSIS — E1142 Type 2 diabetes mellitus with diabetic polyneuropathy: Secondary | ICD-10-CM | POA: Diagnosis not present

## 2020-02-05 ENCOUNTER — Other Ambulatory Visit: Payer: Self-pay | Admitting: Cardiology

## 2020-02-05 DIAGNOSIS — I10 Essential (primary) hypertension: Secondary | ICD-10-CM

## 2020-02-08 DIAGNOSIS — J449 Chronic obstructive pulmonary disease, unspecified: Secondary | ICD-10-CM | POA: Diagnosis not present

## 2020-02-11 ENCOUNTER — Emergency Department (HOSPITAL_COMMUNITY): Payer: Medicare Other

## 2020-02-11 ENCOUNTER — Ambulatory Visit: Payer: Medicare Other | Admitting: Cardiology

## 2020-02-11 ENCOUNTER — Other Ambulatory Visit: Payer: Self-pay

## 2020-02-11 ENCOUNTER — Encounter: Payer: Self-pay | Admitting: Cardiology

## 2020-02-11 ENCOUNTER — Emergency Department (HOSPITAL_COMMUNITY)
Admission: EM | Admit: 2020-02-11 | Discharge: 2020-02-12 | Disposition: A | Discharge status: Home / Self Care | Payer: Medicare Other | Attending: Emergency Medicine | Admitting: Emergency Medicine

## 2020-02-11 VITALS — BP 167/77 | HR 82 | Temp 97.8°F | Resp 16 | Ht 62.0 in | Wt 135.0 lb

## 2020-02-11 DIAGNOSIS — R42 Dizziness and giddiness: Secondary | ICD-10-CM | POA: Diagnosis not present

## 2020-02-11 DIAGNOSIS — I272 Pulmonary hypertension, unspecified: Secondary | ICD-10-CM | POA: Diagnosis not present

## 2020-02-11 DIAGNOSIS — Z87891 Personal history of nicotine dependence: Secondary | ICD-10-CM | POA: Diagnosis not present

## 2020-02-11 DIAGNOSIS — R059 Cough, unspecified: Secondary | ICD-10-CM

## 2020-02-11 DIAGNOSIS — Z79899 Other long term (current) drug therapy: Secondary | ICD-10-CM | POA: Diagnosis not present

## 2020-02-11 DIAGNOSIS — R05 Cough: Secondary | ICD-10-CM | POA: Insufficient documentation

## 2020-02-11 DIAGNOSIS — I10 Essential (primary) hypertension: Secondary | ICD-10-CM

## 2020-02-11 DIAGNOSIS — R0902 Hypoxemia: Secondary | ICD-10-CM | POA: Diagnosis not present

## 2020-02-11 DIAGNOSIS — Z743 Need for continuous supervision: Secondary | ICD-10-CM | POA: Diagnosis not present

## 2020-02-11 DIAGNOSIS — J449 Chronic obstructive pulmonary disease, unspecified: Secondary | ICD-10-CM | POA: Insufficient documentation

## 2020-02-11 DIAGNOSIS — R0602 Shortness of breath: Secondary | ICD-10-CM | POA: Diagnosis not present

## 2020-02-11 DIAGNOSIS — R9431 Abnormal electrocardiogram [ECG] [EKG]: Secondary | ICD-10-CM | POA: Diagnosis not present

## 2020-02-11 LAB — CBC
HCT: 38.2 % (ref 36.0–46.0)
Hemoglobin: 11.5 g/dL — ABNORMAL LOW (ref 12.0–15.0)
MCH: 26.8 pg (ref 26.0–34.0)
MCHC: 30.1 g/dL (ref 30.0–36.0)
MCV: 89 fL (ref 80.0–100.0)
Platelets: 260 10*3/uL (ref 150–400)
RBC: 4.29 MIL/uL (ref 3.87–5.11)
RDW: 14.5 % (ref 11.5–15.5)
WBC: 9.1 10*3/uL (ref 4.0–10.5)
nRBC: 0 % (ref 0.0–0.2)

## 2020-02-11 LAB — BASIC METABOLIC PANEL
Anion gap: 9 (ref 5–15)
BUN: 24 mg/dL — ABNORMAL HIGH (ref 8–23)
CO2: 33 mmol/L — ABNORMAL HIGH (ref 22–32)
Calcium: 9.1 mg/dL (ref 8.9–10.3)
Chloride: 99 mmol/L (ref 98–111)
Creatinine, Ser: 1.96 mg/dL — ABNORMAL HIGH (ref 0.44–1.00)
GFR calc Af Amer: 25 mL/min — ABNORMAL LOW (ref >60)
GFR calc non Af Amer: 22 mL/min — ABNORMAL LOW (ref >60)
Glucose, Bld: 147 mg/dL — ABNORMAL HIGH (ref 70–99)
Potassium: 3.8 mmol/L (ref 3.5–5.1)
Sodium: 141 mmol/L (ref 135–145)

## 2020-02-11 MED ORDER — SODIUM CHLORIDE 0.9% FLUSH: 3.0000 mL | Freq: Once | INTRAVENOUS | Status: DC

## 2020-02-11 NOTE — Progress Notes (Signed)
Follow up visit  Subjective:   Sheri [name], female    DOB: May 28, 1926, 86 y.o.   MRN: [id number]   HPI   Chief Complaint  Patient presents with  . Hypertension  . Shortness of Breath  . Follow-up    3 month    86 year old African-American female with hypertension, former smoker, moderate to severe COPD, WHO group 3 pulmonary hypertension.  At last visit, her BP was uncontrolled. She has mild LVOT obstruction. Thus, I switched amlodipine 5 mg to verapamil 240 mg daily.  Patient is here here for follow up visit with her daughter.  She has had worsening cough with expectoration over the last 3 weeks, associated with shortness of breath.  Surprisingly, she is not on portable oxygen at home, although has oxygen cylinder at home uses 2-3 L.  Oxygen saturation in the office without supplemental oxygen  was 77%.  Patient has an appointment to see her PCP Dr. Katherine Roan tomorrow.   Current Outpatient Medications on File Prior to Visit  Medication Sig Dispense Refill  . diclofenac sodium (VOLTAREN) 1 % GEL as needed.    . ferrous sulfate 325 (65 FE) MG tablet TAKE 1 TABLET BY MOUTH TWICE A DAY WITH A MEAL    . gabapentin (NEURONTIN) 100 MG capsule 2 (two) times a day.    . hydrochlorothiazide (MICROZIDE) 12.5 MG capsule TAKE 1 CAPSULE BY MOUTH EVERY DAY 90 capsule 2  . JANUVIA 100 MG tablet Take 100 mg by mouth daily.     . mirtazapine (REMERON) 7.5 MG tablet every evening.  3  . simvastatin (ZOCOR) 40 MG tablet Take by mouth.    . SYMBICORT 160-4.5 MCG/ACT inhaler USE ONE INHALATION TWICE A DAY  5  . Tiotropium Bromide Monohydrate (SPIRIVA RESPIMAT) 1.25 MCG/ACT AERS Inhale 2 puffs into the lungs daily. 1 Inhaler 0  . verapamil (CALAN-SR) 240 MG CR tablet TAKE 1 TABLET BY MOUTH AT BEDTIME. 90 tablet 1  . Vitamin D, Ergocalciferol, (DRISDOL) 1.25 MG (50000 UT) CAPS capsule TAKE 1 CAPSULE BY MOUTH ONCE EVERY WEEK     No current facility-administered medications on file prior to  visit.    Cardiovascular & other pertient studies:  EKG 11/12/2019: Sinus rhythm 81 bpm. Nonspecific T-abnormality.   Echocardiogram 03/30/2019: Normal LV systolic function with EF 56%. Left ventricle cavity is normal in size. Mild concentric hypertrophy of the left ventricle. Hyperdynamic LV. Mild LVOT gradient likely due to hyperdynamic LV. No SAM seen.  Doppler evidence of grade I (impaired) diastolic dysfunction, normal LAP. Calculated EF 56%. Trileaflet aortic valve. Mild calcification. No significant valvular stenosis.  Mild (Grade I) aortic regurgitation. Moderate tricuspid regurgitation. Estimated pulmonary artery systolic pressure 49 mmHg. RVSP measures 49 mmHg. No significant change compared to previous study on 01/20/2018.   Recent labs: Not available.   Review of Systems  Cardiovascular: Positive for dyspnea on exertion. Negative for chest pain, leg swelling, palpitations and syncope.  Neurological: Positive for dizziness.         Vitals:   02/11/20 1510 02/11/20 1536  BP: (!) 167/77   Pulse: 82   Resp: 16   Temp: 97.8 F (36.6 C)   SpO2: (!) 77% 95%      Body mass index is 24.69 kg/m. Filed Weights   02/11/20 1510  Weight: 135 lb (61.2 kg)     Objective:   Physical Exam  Constitutional: She appears well-developed and well-nourished.  Neck: No JVD present.  Cardiovascular: Normal rate, regular rhythm and  intact distal pulses.  Murmur heard.  Systolic murmur is present with a grade of 2/6 at the upper right sternal border. Pulmonary/Chest: Effort normal and breath sounds normal. She has no wheezes. She has no rales.  Musculoskeletal:        General: No edema.  Nursing note and vitals reviewed.      Assessment & Recommendations:   86 year old African-American female with hypertension, former smoker, moderate to severe COPD, WHO group 3 pulmonary hypertension.  Exertional dyspnea: Likely due to COPD and WHO grp III PH. Worsening dyspnea and  cough expectoration over the last 3 weeks. Hypoxia at 77% without supplemental oxygen. Patient responded to 3 L oxygen with sats up to 95%. Patient does not have portable oxygen. I am concerned that she could have significant worsening hypoxia before she reaches home today.  Recommend ED evaluation for possible COPD exacerbation, and hospital admission as necessary.  Hypertension: Uncontrolled. She has mild LVOT obstruction. Continuie verapamil 240 mg daily.    Elder Negus, MD [facility] [place] Pager: [phone] Office: [phone]

## 2020-02-11 NOTE — ED Triage Notes (Signed)
BIB EMS from [facility]. Scheduled visit with PCP secondary to cough X 3 weeks. Pt also had multiple falls last week. Wears 2-3L [place] chronically, pt did not use any O2 to transport to PCP so O2 sats 77% upon arrival to office.

## 2020-02-12 DIAGNOSIS — R05 Cough: Secondary | ICD-10-CM | POA: Diagnosis not present

## 2020-02-12 DIAGNOSIS — Z743 Need for continuous supervision: Secondary | ICD-10-CM | POA: Diagnosis not present

## 2020-02-12 DIAGNOSIS — Z7401 Bed confinement status: Secondary | ICD-10-CM | POA: Diagnosis not present

## 2020-02-12 DIAGNOSIS — I469 Cardiac arrest, cause unspecified: Secondary | ICD-10-CM | POA: Diagnosis not present

## 2020-02-12 DIAGNOSIS — M2550 Pain in unspecified joint: Secondary | ICD-10-CM | POA: Diagnosis not present

## 2020-02-12 MED ORDER — ALBUTEROL SULFATE HFA 108 (90 BASE) MCG/ACT IN AERS
4.0000 | INHALATION_SPRAY | Freq: Once | RESPIRATORY_TRACT | Status: AC
  Administered 2020-02-12: 4 via RESPIRATORY_TRACT
  Filled 2020-02-12: qty 6.7

## 2020-02-12 MED ORDER — AZITHROMYCIN 250 MG PO TABS: ORAL_TABLET | ORAL | 0 refills | Status: AC

## 2020-02-12 MED ORDER — PREDNISONE 20 MG PO TABS
60.0000 mg | ORAL_TABLET | Freq: Once | ORAL | Status: AC
  Administered 2020-02-12: 60 mg via ORAL
  Filled 2020-02-12: qty 3

## 2020-02-12 MED ORDER — PREDNISONE 20 MG PO TABS: 20.0000 mg | ORAL_TABLET | Freq: Every day | ORAL | 0 refills | Status: AC

## 2020-02-12 NOTE — ED Notes (Signed)
Pt discharge instructions and prescriptions reviewed with the patient. Pt verbalized understanding of both. Pt discharged.

## 2020-02-12 NOTE — Progress Notes (Signed)
RNCM consulted regarding pt requiring portable home oxygen.  RNCM contacted [employer] to find that they do not provide what pt needs and that she will have to change companies.  RNCM provided list of DME companies and pt chose [employer].  RNCM contacted [facility] to have pt oxygen delivered to pt home.

## 2020-02-12 NOTE — Discharge Instructions (Addendum)
You have been seen today for cough and low oxygen. Please read and follow all provided instructions. Return to the emergency room for worsening condition or new concerning symptoms.    1. Medications:  -Prescription to pharmacy for Z-Pak for the cough. -Prescription also sent for prednisone to treat a COPD exacerbation. Continue usual home medications Take medications as prescribed. Please review all of the medicines and only take them if you do not have an allergy to them.   2. Treatment: rest, drink plenty of fluids  3. Follow Up:  Please follow up with your lung doctor today at your scheduled appointment.   ?

## 2020-02-12 NOTE — ED Provider Notes (Signed)
[facility] EMERGENCY DEPARTMENT Provider Note   CSN: [id number] Arrival date & time: 02/11/20  1622     History Chief Complaint  Patient presents with  . Cough    Sheri [name] is a 86 y.o. female with past medical history of bronchitis, COPD, hypertension presents to emergency department today via EMS from [place] [facility] with chief complaint of cough x3 weeks.  [name] is at the bedside who is contributing historian.  She has had intermittent productive cough with white phlegm.  She denies any shortness of breath. Patient wears 3 L nasal cannula at all times when home. However she does not have any portable oxygen and her daughter tried to get her a portable tank however it was denied by insurance. Patient was at cardiology office and found to be hypoxic to 77% on room air on arrival.  She was sent to the emergency department for further evaluation.  Patient denies any fever, chills, chest pain, abdominal pain, back pain, nausea, vomiting, urinary symptoms, diarrhea, lower extremity edema.  History provided by patient with additional history obtained from chart review.    Past Medical History:  Diagnosis Date  . Bronchitis   . COPD (chronic obstructive pulmonary disease) (HCC)   . Hypertension     Patient Active Problem List   Diagnosis Date Noted  . Pulmonary hypertension (HCC) 04/18/2019  . Chronic respiratory failure with hypoxia (HCC) 03/09/2018  . Gait difficulty 02/06/2018  . Chronic obstructive pulmonary disease (HCC) 01/04/2015  . Former tobacco use 01/04/2015  . Essential hypertension 01/04/2015  . Acute renal failure (HCC) 01/04/2015  . Anemia 01/04/2015    Past Surgical History:  Procedure Laterality Date  . EYE SURGERY    . TUBAL LIGATION       OB History   No obstetric history on file.     Family History  Problem Relation Age of Onset  . Breast cancer Other     Social History   Tobacco Use  . Smoking  status: Former Smoker    Packs/day: 0.50    Types: Cigarettes    Quit date: 01/03/2002    Years since quitting: 18.1  . Smokeless tobacco: Never Used  . Tobacco comment: teenager  Substance Use Topics  . Alcohol use: No  . Drug use: No    Home Medications Prior to Admission medications   Medication Sig Start Date End Date Taking? Authorizing Provider  diclofenac sodium (VOLTAREN) 1 % GEL Apply 2 g topically 2 (two) times daily as needed (joint pain).  03/25/19  Yes [provider]  ferrous sulfate 325 (65 FE) MG tablet Take 325 mg by mouth 2 (two) times daily with a meal.  01/23/19  Yes [provider]  gabapentin (NEURONTIN) 100 MG capsule 2 (two) times a day. 02/08/19  Yes [provider]  hydrochlorothiazide (MICROZIDE) 12.5 MG capsule TAKE 1 CAPSULE BY MOUTH EVERY DAY Patient taking differently: Take 12.5 mg by mouth daily.  09/13/19  Yes Patwardhan, Manish J, MD  JANUVIA 100 MG tablet Take 100 mg by mouth daily.  02/20/19  Yes [provider]  mirtazapine (REMERON) 7.5 MG tablet Take 7.5 mg by mouth at bedtime.  01/16/18  Yes [provider]  OXYGEN Inhale 3 L into the lungs continuous.   Yes [provider]  simvastatin (ZOCOR) 40 MG tablet Take 40 mg by mouth daily at 6 PM.    Yes [provider]  SYMBICORT 160-4.5 MCG/ACT inhaler Inhale 2 puffs  into the lungs 2 (two) times daily.  01/30/18  Yes [provider]  Tiotropium Bromide Monohydrate (SPIRIVA RESPIMAT) 1.25 MCG/ACT AERS Inhale 2 puffs into the lungs daily. 02/06/18  Yes Oretha Milch, MD  verapamil (CALAN-SR) 240 MG CR tablet TAKE 1 TABLET BY MOUTH AT BEDTIME. Patient taking differently: Take 240 mg by mouth at bedtime.  02/05/20  Yes Patwardhan, Manish J, MD  Vitamin D, Ergocalciferol, (DRISDOL) 1.25 MG (50000 UT) CAPS capsule Take 50,000 Units by mouth every 7 (seven) days. Monday 02/13/19  Yes [provider]  azithromycin (ZITHROMAX Z-PAK) 250 MG  tablet Take 2 tablets (500 mg total) by mouth daily for 1 day, THEN 1 tablet (250 mg total) daily for 4 days. 02/12/20 02/17/20  Asmi Fugere E, PA-C  predniSONE (DELTASONE) 20 MG tablet Take 1 tablet (20 mg total) by mouth daily for 4 days. 02/12/20 02/16/20  Alwin Lanigan, Caroleen Hamman, PA-C    Allergies    Arlice Colt isothiocyanate]  Review of Systems   Review of Systems  All other systems are reviewed and are negative for acute change except as noted in the HPI.   Physical Exam Updated Vital Signs BP 136/75 (BP Location: Left Arm)   Pulse 85   Temp 98.4 F (36.9 C) (Oral)   Resp 14   SpO2 100%   Physical Exam Vitals and nursing note reviewed.  Constitutional:      General: She is not in acute distress.    Appearance: She is not ill-appearing.  HENT:     Head: Normocephalic and atraumatic.     Right Ear: Tympanic membrane and external ear normal.     Left Ear: Tympanic membrane and external ear normal.     Nose: Nose normal.     Mouth/Throat:     Mouth: Mucous membranes are moist.     Pharynx: Oropharynx is clear.  Eyes:     General: No scleral icterus.       Right eye: No discharge.        Left eye: No discharge.     Extraocular Movements: Extraocular movements intact.     Conjunctiva/sclera: Conjunctivae normal.     Pupils: Pupils are equal, round, and reactive to light.  Neck:     Vascular: No JVD.  Cardiovascular:     Rate and Rhythm: Normal rate and regular rhythm.     Pulses: Normal pulses.          Radial pulses are 2+ on the right side and 2+ on the left side.     Heart sounds: Normal heart sounds.  Pulmonary:     Comments: Lung sounds are diminished at bilateral bases.  Symmetric chest rise. No wheezing, rales, or rhonchi.  Normal work of breathing.  Oxygen saturation is 99% on 3 L.  Speaking in full sentences without signs of respiratory distress. Abdominal:     Comments: Abdomen is soft, non-distended, and non-tender in all quadrants. No rigidity, no  guarding. No peritoneal signs.  Musculoskeletal:        General: Normal range of motion.     Cervical back: Normal range of motion.     Right lower leg: No edema.     Left lower leg: No edema.  Skin:    General: Skin is warm and dry.     Capillary Refill: Capillary refill takes less than 2 seconds.  Neurological:     Mental Status: She is oriented to person, place, and time.     GCS:  GCS eye subscore is 4. GCS verbal subscore is 5. GCS motor subscore is 6.     Comments: Fluent speech, no facial droop.  Speech is clear and goal oriented, follows commands CN III-XII intact, no facial droop Normal strength in upper and lower extremities bilaterally including dorsiflexion and plantar flexion, strong and equal grip strength Sensation normal to light and sharp touch Moves extremities without ataxia, coordination intact Normal finger to nose and rapid alternating movements   Psychiatric:        Behavior: Behavior normal.     ED Results / Procedures / Treatments   Labs (all labs ordered are listed, but only abnormal results are displayed) Labs Reviewed  BASIC METABOLIC PANEL - Abnormal; Notable for the following components:      Result Value   CO2 33 (*)    Glucose, Bld 147 (*)    BUN 24 (*)    Creatinine, Ser 1.96 (*)    GFR calc non Af Amer 22 (*)    GFR calc Af Amer 25 (*)    All other components within normal limits  CBC - Abnormal; Notable for the following components:   Hemoglobin 11.5 (*)    All other components within normal limits    EKG EKG Interpretation  Date/Time:  Tuesday February 12 2020 08:45:04 EDT Ventricular Rate:  81 PR Interval:  160 QRS Duration: 94 QT Interval:  374 QTC Calculation: 435 R Axis:   88 Text Interpretation: Sinus rhythm Borderline right axis deviation Minimal ST elevation, inferior leads No significant change since last tracing Confirmed by Melene Plan [phone]) on 02/12/2020 12:10:49 PM   Radiology DG Chest 2 View  Result Date:  02/11/2020 CLINICAL DATA:  Cough, shortness of breath EXAM: CHEST - 2 VIEW COMPARISON:  02/21/2019 FINDINGS: The heart size and mediastinal contours are within normal limits. Both lungs are clear. The visualized skeletal structures are unremarkable. IMPRESSION: No acute abnormality of the lungs. Electronically Signed   By: Lauralyn Primes M.D.   On: 02/11/2020 17:23    Procedures Procedures (including critical care time)  Medications Ordered in ED Medications  sodium chloride flush (NS) 0.9 % injection 3 mL (has no administration in time range)  albuterol (VENTOLIN HFA) 108 (90 Base) MCG/ACT inhaler 4-6 puff (4 puffs Inhalation Given 02/12/20 0834)  predniSONE (DELTASONE) tablet 60 mg (60 mg Oral Given 02/12/20 9518)    ED Course  I have reviewed the triage vital signs and the nursing notes.  Pertinent labs & imaging results that were available during my care of the patient were reviewed by me and considered in my medical decision making (see chart for details).  Clinical Course as of Feb 12 1220  Tue Feb 12, 2020  0852 Left voicemail on secure mailbox for social work to discuss portable O2 for patient as she cannot safely be discharged without oxygen   [KA]    Clinical Course User Index [KA] Harlea Goetzinger, Mliss Sax   MDM Rules/Calculators/A&P                      Patient seen and examined. Patient presents awake, alert, hemodynamically stable, afebrile, non toxic.  Apraxia on her normal 3 L.  No tachycardia.  On exam lung sounds are diminished in bilateral bases.  No wheezing, rales or rhonchi heard.  No signs of volume overload, no lower extremity edema. Patient had labs collected in triage.  I viewed the results.  No leukocytosis, hemoglobin is 11.5 appears consistent  with her baseline.  No severe electrolyte derangement, she does have creatinine of 1.96, they previously have to compare this to was 2 years ago and it was 0.96.  Patient has a PCP but I am unable to see her lab work in  our system.  Patient clinically does not appear dehydrated and is tolerating p.o. intake without any difficulty here.  I viewed pt's chest xray and it does not suggest acute infectious processes. EKG shows no ischemic changes.  Case discussed with case management regarding portable oxygen. It was arranged for portable oxygen tank and home tank to be brought to patient's house within 30 minutes to ensure it would be home for her to use immediately.   On reassessment she continues to have normal work of breathing. Lung bases sound improved after albuterol and prednisone. Will cover for COPD exacerbation with zpak and short burst of prednisone. Patient will be discharged home via [facility] with nasal cannula. The patient appears reasonably screened and/or stabilized for discharge and I doubt any other medical condition or other [facility] requiring further screening, evaluation, or treatment in the ED at this time prior to discharge. The patient is safe for discharge with strict return precautions discussed. Recommend close pcp and pulmonology follow up. Patient has both pcp and pulmonology appointments scheduled for next week. She will need creatinine rechecked by pcp, daughter aware. The patient was discussed with and seen by Dr. Tyrone Nine who agrees with the treatment plan.   Portions of this note were generated with [employer]. Dictation errors may occur despite best attempts at proofreading.   Final Clinical Impression(s) / ED Diagnoses Final diagnoses:  Cough    Rx / DC Orders ED Discharge Orders         Ordered    predniSONE (DELTASONE) 20 MG tablet  Daily     02/12/20 1201    azithromycin (ZITHROMAX Z-PAK) 250 MG tablet     02/12/20 1201           Jaella Weinert, [place], PA-C 02/12/20 [place], [place], DO 02/12/20 1302

## 2020-02-12 NOTE — Discharge Planning (Signed)
RNCM met with pt and daughter at bedside to find out the name of company who supplies oxygen.  PT and daughter do not remember RNCM reviewed chart ot find that [facility].

## 2020-02-12 NOTE — Discharge Planning (Signed)
RNCM consulted regarding pt needing portable oxygen tank for home use.  RNCM contacted [facility] to aid with this matter.

## 2020-02-15 DIAGNOSIS — J449 Chronic obstructive pulmonary disease, unspecified: Secondary | ICD-10-CM | POA: Diagnosis not present

## 2020-02-21 ENCOUNTER — Ambulatory Visit (INDEPENDENT_AMBULATORY_CARE_PROVIDER_SITE_OTHER): Payer: Medicare Other | Admitting: Podiatry

## 2020-02-21 ENCOUNTER — Other Ambulatory Visit: Payer: Self-pay

## 2020-02-21 ENCOUNTER — Encounter: Payer: Self-pay | Admitting: Podiatry

## 2020-02-21 VITALS — Temp 97.3°F

## 2020-02-21 DIAGNOSIS — M779 Enthesopathy, unspecified: Secondary | ICD-10-CM | POA: Diagnosis not present

## 2020-02-21 DIAGNOSIS — G629 Polyneuropathy, unspecified: Secondary | ICD-10-CM | POA: Diagnosis not present

## 2020-02-22 NOTE — Progress Notes (Signed)
Subjective:   Patient ID: Sheri [name], female   DOB: 86 y.o.   MRN: [id number]   HPI Patient presents stating that she has problems with burning pain at times and it seems to extend more into her legs and does have diabetes.  She presents with caregiver today and is concerned about just generalized care and the fact that she does have diabetes now and some burning pain.  Patient does not smoke currently and is not active   Review of Systems  All other systems reviewed and are negative.       Objective:  Physical Exam Vitals and nursing note reviewed.  Constitutional:      Appearance: She is well-developed.  Pulmonary:     Effort: Pulmonary effort is normal.  Musculoskeletal:        General: Normal range of motion.  Skin:    General: Skin is warm.  Neurological:     Mental Status: She is alert.     Vascular status found to be moderately diminished with diminished pulses PT and DP.  Neurological found diminished sharp dull and vibratory bilateral with muscle strength found to be diminished and range of motion of the subtalar joint diminished bilateral.  I did note that there is no breakdown of tissue currently the nails are adequately taken care of with no drainage noted or thickness or discomfort currently.  Patient is concerned about low-grade burning and has been on gabapentin but only taking 1/day     Assessment:  Probability for low-grade neuropathy which may be age related diabetes related or related to possible stenosis     Plan:  H&P reviewed condition and at this point I recommended increasing the gabapentin and educated her on this along with possible physical therapy but I do not think that is necessary 1st.  I advised her and caregiver on daily diabetic foot inspections and explained to them how to do these and if any issues were to occur to reappoint immediately

## 2020-02-25 DIAGNOSIS — J441 Chronic obstructive pulmonary disease with (acute) exacerbation: Secondary | ICD-10-CM | POA: Diagnosis not present

## 2020-02-25 DIAGNOSIS — E7800 Pure hypercholesterolemia, unspecified: Secondary | ICD-10-CM | POA: Diagnosis not present

## 2020-02-25 DIAGNOSIS — E559 Vitamin D deficiency, unspecified: Secondary | ICD-10-CM | POA: Diagnosis not present

## 2020-02-25 DIAGNOSIS — E1165 Type 2 diabetes mellitus with hyperglycemia: Secondary | ICD-10-CM | POA: Diagnosis not present

## 2020-02-25 DIAGNOSIS — J181 Lobar pneumonia, unspecified organism: Secondary | ICD-10-CM | POA: Diagnosis not present

## 2020-02-29 DIAGNOSIS — I509 Heart failure, unspecified: Secondary | ICD-10-CM | POA: Diagnosis not present

## 2020-02-29 DIAGNOSIS — I119 Hypertensive heart disease without heart failure: Secondary | ICD-10-CM | POA: Diagnosis not present

## 2020-02-29 DIAGNOSIS — J449 Chronic obstructive pulmonary disease, unspecified: Secondary | ICD-10-CM | POA: Diagnosis not present

## 2020-02-29 DIAGNOSIS — E1143 Type 2 diabetes mellitus with diabetic autonomic (poly)neuropathy: Secondary | ICD-10-CM | POA: Diagnosis not present

## 2020-03-05 DIAGNOSIS — J441 Chronic obstructive pulmonary disease with (acute) exacerbation: Secondary | ICD-10-CM | POA: Diagnosis not present

## 2020-03-05 DIAGNOSIS — R0602 Shortness of breath: Secondary | ICD-10-CM | POA: Diagnosis not present

## 2020-03-05 DIAGNOSIS — R0600 Dyspnea, unspecified: Secondary | ICD-10-CM | POA: Diagnosis not present

## 2020-03-05 DIAGNOSIS — M130 Polyarthritis, unspecified: Secondary | ICD-10-CM | POA: Diagnosis not present

## 2020-03-05 DIAGNOSIS — E1169 Type 2 diabetes mellitus with other specified complication: Secondary | ICD-10-CM | POA: Diagnosis not present

## 2020-03-12 ENCOUNTER — Other Ambulatory Visit: Payer: Self-pay | Admitting: Cardiology

## 2020-03-12 DIAGNOSIS — I10 Essential (primary) hypertension: Secondary | ICD-10-CM

## 2020-03-16 DIAGNOSIS — J449 Chronic obstructive pulmonary disease, unspecified: Secondary | ICD-10-CM | POA: Diagnosis not present

## 2020-03-25 DIAGNOSIS — E1165 Type 2 diabetes mellitus with hyperglycemia: Secondary | ICD-10-CM | POA: Diagnosis not present

## 2020-03-25 DIAGNOSIS — J181 Lobar pneumonia, unspecified organism: Secondary | ICD-10-CM | POA: Diagnosis not present

## 2020-03-25 DIAGNOSIS — E559 Vitamin D deficiency, unspecified: Secondary | ICD-10-CM | POA: Diagnosis not present

## 2020-03-25 DIAGNOSIS — J441 Chronic obstructive pulmonary disease with (acute) exacerbation: Secondary | ICD-10-CM | POA: Diagnosis not present

## 2020-03-25 DIAGNOSIS — E7800 Pure hypercholesterolemia, unspecified: Secondary | ICD-10-CM | POA: Diagnosis not present

## 2020-04-16 DIAGNOSIS — E1169 Type 2 diabetes mellitus with other specified complication: Secondary | ICD-10-CM | POA: Diagnosis not present

## 2020-04-16 DIAGNOSIS — J441 Chronic obstructive pulmonary disease with (acute) exacerbation: Secondary | ICD-10-CM | POA: Diagnosis not present

## 2020-04-16 DIAGNOSIS — J449 Chronic obstructive pulmonary disease, unspecified: Secondary | ICD-10-CM | POA: Diagnosis not present

## 2020-04-16 DIAGNOSIS — M130 Polyarthritis, unspecified: Secondary | ICD-10-CM | POA: Diagnosis not present

## 2020-05-16 DIAGNOSIS — J449 Chronic obstructive pulmonary disease, unspecified: Secondary | ICD-10-CM | POA: Diagnosis not present

## 2020-06-12 DIAGNOSIS — Z79899 Other long term (current) drug therapy: Secondary | ICD-10-CM | POA: Diagnosis not present

## 2020-06-12 DIAGNOSIS — J441 Chronic obstructive pulmonary disease with (acute) exacerbation: Secondary | ICD-10-CM | POA: Diagnosis not present

## 2020-06-12 DIAGNOSIS — R7302 Impaired glucose tolerance (oral): Secondary | ICD-10-CM | POA: Diagnosis not present

## 2020-06-12 DIAGNOSIS — I119 Hypertensive heart disease without heart failure: Secondary | ICD-10-CM | POA: Diagnosis not present

## 2020-06-12 DIAGNOSIS — R200 Anesthesia of skin: Secondary | ICD-10-CM | POA: Diagnosis not present

## 2020-06-12 DIAGNOSIS — E7800 Pure hypercholesterolemia, unspecified: Secondary | ICD-10-CM | POA: Diagnosis not present

## 2020-06-12 DIAGNOSIS — E559 Vitamin D deficiency, unspecified: Secondary | ICD-10-CM | POA: Diagnosis not present

## 2020-06-12 DIAGNOSIS — E1165 Type 2 diabetes mellitus with hyperglycemia: Secondary | ICD-10-CM | POA: Diagnosis not present

## 2020-06-16 DIAGNOSIS — J441 Chronic obstructive pulmonary disease with (acute) exacerbation: Secondary | ICD-10-CM | POA: Diagnosis not present

## 2020-06-16 DIAGNOSIS — J449 Chronic obstructive pulmonary disease, unspecified: Secondary | ICD-10-CM | POA: Diagnosis not present

## 2020-06-16 DIAGNOSIS — I10 Essential (primary) hypertension: Secondary | ICD-10-CM | POA: Diagnosis not present

## 2020-07-01 DIAGNOSIS — J449 Chronic obstructive pulmonary disease, unspecified: Secondary | ICD-10-CM | POA: Diagnosis not present

## 2020-07-01 DIAGNOSIS — E1100 Type 2 diabetes mellitus with hyperosmolarity without nonketotic hyperglycemic-hyperosmolar coma (NKHHC): Secondary | ICD-10-CM | POA: Diagnosis not present

## 2020-07-01 DIAGNOSIS — I272 Pulmonary hypertension, unspecified: Secondary | ICD-10-CM | POA: Diagnosis not present

## 2020-07-01 DIAGNOSIS — E1142 Type 2 diabetes mellitus with diabetic polyneuropathy: Secondary | ICD-10-CM | POA: Diagnosis not present

## 2020-07-02 ENCOUNTER — Ambulatory Visit: Payer: Medicare Other | Admitting: Cardiology

## 2020-07-02 NOTE — Progress Notes (Deleted)
Follow up visit  Subjective:   Sheri [name], female    DOB: December 02, 1925, 86 y.o.   MRN: [id number]   HPI   No chief complaint on file.   86 year old African-American female with hypertension, former smoker, moderate to severe COPD, WHO group 3 pulmonary hypertension.  At last visit, her BP was uncontrolled. She has mild LVOT obstruction. Thus, I switched amlodipine 5 mg to verapamil 240 mg daily.  Patient is here here for follow up visit with her daughter.  She has had worsening cough with expectoration over the last 3 weeks, associated with shortness of breath.  Surprisingly, she is not on portable oxygen at home, although has oxygen cylinder at home uses 2-3 L.  Oxygen saturation in the office without supplemental oxygen  was 77%.  Patient has an appointment to see her PCP Dr. Petra Kuba tomorrow.   Current Outpatient Medications on File Prior to Visit  Medication Sig Dispense Refill  . diclofenac sodium (VOLTAREN) 1 % GEL Apply 2 g topically 2 (two) times daily as needed (joint pain).     . ferrous sulfate 325 (65 FE) MG tablet Take 325 mg by mouth 2 (two) times daily with a meal.     . gabapentin (NEURONTIN) 100 MG capsule 2 (two) times a day.    . hydrochlorothiazide (MICROZIDE) 12.5 MG capsule TAKE 1 CAPSULE BY MOUTH EVERY DAY 135 capsule 2  . JANUVIA 100 MG tablet Take 100 mg by mouth daily.     . mirtazapine (REMERON) 7.5 MG tablet Take 7.5 mg by mouth at bedtime.   3  . OXYGEN Inhale 3 L into the lungs continuous.    . pantoprazole (PROTONIX) 40 MG tablet Take 40 mg by mouth daily.    . rosuvastatin (CRESTOR) 10 MG tablet Take by mouth daily. Unaware of dose    . simvastatin (ZOCOR) 40 MG tablet Take 40 mg by mouth daily at 6 PM.     . SYMBICORT 160-4.5 MCG/ACT inhaler Inhale 2 puffs into the lungs 2 (two) times daily.   5  . Tiotropium Bromide Monohydrate (SPIRIVA RESPIMAT) 1.25 MCG/ACT AERS Inhale 2 puffs into the lungs daily. 1 Inhaler 0  . triamcinolone (KENALOG)  0.025 % ointment     . verapamil (CALAN-SR) 240 MG CR tablet TAKE 1 TABLET BY MOUTH AT BEDTIME. (Patient taking differently: Take 240 mg by mouth at bedtime. ) 90 tablet 1  . Vitamin D, Ergocalciferol, (DRISDOL) 1.25 MG (50000 UT) CAPS capsule Take 50,000 Units by mouth every 7 (seven) days. Monday     No current facility-administered medications on file prior to visit.    Cardiovascular & other pertient studies:  EKG 11/12/2019: Sinus rhythm 81 bpm. Nonspecific T-abnormality.   Echocardiogram 03/30/2019: Normal LV systolic function with EF 56%. Left ventricle cavity is normal in size. Mild concentric hypertrophy of the left ventricle. Hyperdynamic LV. Mild LVOT gradient likely due to hyperdynamic LV. No SAM seen.  Doppler evidence of grade I (impaired) diastolic dysfunction, normal LAP. Calculated EF 56%. Trileaflet aortic valve. Mild calcification. No significant valvular stenosis.  Mild (Grade I) aortic regurgitation. Moderate tricuspid regurgitation. Estimated pulmonary artery systolic pressure 49 mmHg. RVSP measures 49 mmHg. No significant change compared to previous study on 01/20/2018.   Recent labs: Not available.   Review of Systems  Cardiovascular: Positive for dyspnea on exertion. Negative for chest pain, leg swelling, palpitations and syncope.  Neurological: Positive for dizziness.         There were no vitals  filed for this visit.    There is no height or weight on file to calculate BMI. There were no vitals filed for this visit.   Objective:   Physical Exam Vitals and nursing note reviewed.  Constitutional:      Appearance: She is well-developed.  Neck:     Vascular: No JVD.  Cardiovascular:     Rate and Rhythm: Normal rate and regular rhythm.     Pulses: Intact distal pulses.     Heart sounds: Murmur heard.  Systolic murmur is present with a grade of 2/6 at the upper right sternal border.   Pulmonary:     Effort: Pulmonary effort is normal.      Breath sounds: Normal breath sounds. No wheezing or rales.        Assessment & Recommendations:   86 year old African-American female with hypertension, former smoker, moderate to severe COPD, WHO group 3 pulmonary hypertension.  Exertional dyspnea: Likely due to COPD and WHO grp III PH. Worsening dyspnea and cough expectoration over the last 3 weeks. Hypoxia at 77% without supplemental oxygen. Patient responded to 3 L oxygen with sats up to 95%. Patient does not have portable oxygen. I am concerned that she could have significant worsening hypoxia before she reaches home today.  Recommend ED evaluation for possible COPD exacerbation, and hospital admission as necessary.  Hypertension: Uncontrolled. She has mild LVOT obstruction. Continuie verapamil 240 mg daily.    Elder Negus, MD [facility] [place] Pager: [phone] Office: [phone]

## 2020-07-03 ENCOUNTER — Ambulatory Visit: Payer: Medicare Other | Admitting: Cardiology

## 2020-07-03 ENCOUNTER — Encounter: Payer: Self-pay | Admitting: Cardiology

## 2020-07-03 ENCOUNTER — Other Ambulatory Visit: Payer: Self-pay

## 2020-07-03 VITALS — BP 171/87 | HR 88 | Resp 16 | Ht 62.0 in | Wt 144.0 lb

## 2020-07-03 DIAGNOSIS — J449 Chronic obstructive pulmonary disease, unspecified: Secondary | ICD-10-CM

## 2020-07-03 DIAGNOSIS — I272 Pulmonary hypertension, unspecified: Secondary | ICD-10-CM | POA: Diagnosis not present

## 2020-07-03 DIAGNOSIS — I10 Essential (primary) hypertension: Secondary | ICD-10-CM

## 2020-07-03 MED ORDER — METOPROLOL SUCCINATE ER 50 MG PO TB24: 50.0000 mg | ORAL_TABLET | Freq: Every day | ORAL | 3 refills | Status: DC

## 2020-07-03 NOTE — Progress Notes (Signed)
Follow up visit  Subjective:   Sheri [name], female    DOB: [birth year], 86 y.o.   MRN: [id number]     Chief Complaint  Patient presents with  . Hypertension  . Follow-up    86 year old African-American female with hypertension, former smoker, moderate to severe COPD, WHO group 3 pulmonary hypertension.  Her COPD is managed by Dr. Katherine Roan. Blood pressure is elevated today. She does not check it at home. She has stable exertional dyspnea with home oxygen use. No new complaints today.    Current Outpatient Medications on File Prior to Visit  Medication Sig Dispense Refill  . BREZTRI AEROSPHERE 160-9-4.8 MCG/ACT AERO     . diclofenac sodium (VOLTAREN) 1 % GEL Apply 2 g topically 2 (two) times daily as needed (joint pain).     . ferrous sulfate 325 (65 FE) MG tablet Take 325 mg by mouth 2 (two) times daily with a meal.     . gabapentin (NEURONTIN) 100 MG capsule 2 (two) times a day.    . hydrochlorothiazide (MICROZIDE) 12.5 MG capsule TAKE 1 CAPSULE BY MOUTH EVERY DAY 135 capsule 2  . JANUVIA 100 MG tablet Take 100 mg by mouth daily.     . mirtazapine (REMERON) 7.5 MG tablet Take 7.5 mg by mouth at bedtime.   3  . OXYGEN Inhale 3 L into the lungs continuous.    . pantoprazole (PROTONIX) 40 MG tablet Take 40 mg by mouth daily.    . rosuvastatin (CRESTOR) 10 MG tablet Take by mouth daily. Unaware of dose    . simvastatin (ZOCOR) 40 MG tablet Take 40 mg by mouth daily at 6 PM.     . SYMBICORT 160-4.5 MCG/ACT inhaler Inhale 2 puffs into the lungs 2 (two) times daily.   5  . Tiotropium Bromide Monohydrate (SPIRIVA RESPIMAT) 1.25 MCG/ACT AERS Inhale 2 puffs into the lungs daily. 1 Inhaler 0  . triamcinolone (KENALOG) 0.025 % ointment     . verapamil (CALAN-SR) 240 MG CR tablet TAKE 1 TABLET BY MOUTH AT BEDTIME. (Patient taking differently: Take 240 mg by mouth at bedtime. ) 90 tablet 1  . Vitamin D, Ergocalciferol, (DRISDOL) 1.25 MG (50000 UT) CAPS capsule Take 50,000 Units by mouth  every 7 (seven) days. Monday     No current facility-administered medications on file prior to visit.    Cardiovascular & other pertient studies:  EKG 11/12/2019: Sinus rhythm 81 bpm. Nonspecific T-abnormality.   Echocardiogram 03/30/2019: Normal LV systolic function with EF 56%. Left ventricle cavity is normal in size. Mild concentric hypertrophy of the left ventricle. Hyperdynamic LV. Mild LVOT gradient likely due to hyperdynamic LV. No SAM seen.  Doppler evidence of grade I (impaired) diastolic dysfunction, normal LAP. Calculated EF 56%. Trileaflet aortic valve. Mild calcification. No significant valvular stenosis.  Mild (Grade I) aortic regurgitation. Moderate tricuspid regurgitation. Estimated pulmonary artery systolic pressure 49 mmHg. RVSP measures 49 mmHg. No significant change compared to previous study on 01/20/2018.   Recent labs: 02/11/2020: Glucose 147, BUN/Cr 24/1.96. EGFR 25. Na/K 141/3.8.  H/H 11/38. MCV 89. Platelets 260    Review of Systems  Cardiovascular: Positive for dyspnea on exertion. Negative for chest pain, leg swelling, palpitations and syncope.  Neurological: Positive for dizziness.         Vitals:   07/03/20 1545 07/03/20 1552  BP: (!) 187/89 (!) 171/87  Pulse: 86 88  Resp: 16   SpO2: 91% 95%      Body mass index is 26.34  kg/m. Filed Weights   07/03/20 1545  Weight: 144 lb (65.3 kg)     Objective:   Physical Exam Vitals and nursing note reviewed.  Constitutional:      Appearance: She is well-developed.  Neck:     Vascular: No JVD.  Cardiovascular:     Rate and Rhythm: Normal rate and regular rhythm.     Pulses: Intact distal pulses.     Heart sounds: Murmur heard.  Systolic murmur is present with a grade of 2/6 at the upper right sternal border.   Pulmonary:     Effort: Pulmonary effort is normal.     Breath sounds: Normal breath sounds. No wheezing or rales.        Assessment & Recommendations:   86 year old  African-American female with hypertension, former smoker, moderate to severe COPD, WHO group 3 pulmonary hypertension.  Hypertension: Hyperdynamic LV with mild LVOT obstruction. Added metoprolol succinate 50 mg daily. Continue verapamil.  Okay to continue low dose HCTZ in absence of any lightheadedness symptoms. Arranged for remote patient monitoring through pur pharmacist Manuela Schwartz.  Pulmonary hypertension: WHO Grp III. Continue management of COPD as per PCP.   F/u in 3 months  [place], MD [facility] [place] Pager: [phone] Office: [phone]

## 2020-07-17 DIAGNOSIS — J449 Chronic obstructive pulmonary disease, unspecified: Secondary | ICD-10-CM | POA: Diagnosis not present

## 2020-07-28 DIAGNOSIS — Z23 Encounter for immunization: Secondary | ICD-10-CM | POA: Diagnosis not present

## 2020-07-30 ENCOUNTER — Other Ambulatory Visit: Payer: Self-pay | Admitting: Cardiology

## 2020-07-30 DIAGNOSIS — I10 Essential (primary) hypertension: Secondary | ICD-10-CM

## 2020-07-31 DIAGNOSIS — J449 Chronic obstructive pulmonary disease, unspecified: Secondary | ICD-10-CM | POA: Diagnosis not present

## 2020-07-31 DIAGNOSIS — I272 Pulmonary hypertension, unspecified: Secondary | ICD-10-CM | POA: Diagnosis not present

## 2020-07-31 DIAGNOSIS — E1100 Type 2 diabetes mellitus with hyperosmolarity without nonketotic hyperglycemic-hyperosmolar coma (NKHHC): Secondary | ICD-10-CM | POA: Diagnosis not present

## 2020-07-31 DIAGNOSIS — E1142 Type 2 diabetes mellitus with diabetic polyneuropathy: Secondary | ICD-10-CM | POA: Diagnosis not present

## 2020-07-31 DIAGNOSIS — I10 Essential (primary) hypertension: Secondary | ICD-10-CM | POA: Diagnosis not present

## 2020-08-16 DIAGNOSIS — J449 Chronic obstructive pulmonary disease, unspecified: Secondary | ICD-10-CM | POA: Diagnosis not present

## 2020-08-30 DIAGNOSIS — J449 Chronic obstructive pulmonary disease, unspecified: Secondary | ICD-10-CM | POA: Diagnosis not present

## 2020-08-30 DIAGNOSIS — I272 Pulmonary hypertension, unspecified: Secondary | ICD-10-CM | POA: Diagnosis not present

## 2020-08-30 DIAGNOSIS — E1100 Type 2 diabetes mellitus with hyperosmolarity without nonketotic hyperglycemic-hyperosmolar coma (NKHHC): Secondary | ICD-10-CM | POA: Diagnosis not present

## 2020-08-30 DIAGNOSIS — E1142 Type 2 diabetes mellitus with diabetic polyneuropathy: Secondary | ICD-10-CM | POA: Diagnosis not present

## 2020-08-31 DIAGNOSIS — I10 Essential (primary) hypertension: Secondary | ICD-10-CM | POA: Diagnosis not present

## 2020-09-15 DIAGNOSIS — R1310 Dysphagia, unspecified: Secondary | ICD-10-CM | POA: Diagnosis not present

## 2020-09-16 DIAGNOSIS — J449 Chronic obstructive pulmonary disease, unspecified: Secondary | ICD-10-CM | POA: Diagnosis not present

## 2020-10-02 ENCOUNTER — Other Ambulatory Visit: Payer: Self-pay

## 2020-10-02 ENCOUNTER — Ambulatory Visit: Payer: Medicare Other | Admitting: Cardiology

## 2020-10-02 ENCOUNTER — Encounter: Payer: Self-pay | Admitting: Cardiology

## 2020-10-02 VITALS — BP 170/73 | HR 84 | Ht 62.0 in | Wt 142.0 lb

## 2020-10-02 DIAGNOSIS — I10 Essential (primary) hypertension: Secondary | ICD-10-CM

## 2020-10-02 DIAGNOSIS — I272 Pulmonary hypertension, unspecified: Secondary | ICD-10-CM

## 2020-10-02 DIAGNOSIS — J449 Chronic obstructive pulmonary disease, unspecified: Secondary | ICD-10-CM

## 2020-10-02 NOTE — Progress Notes (Signed)
Follow up visit  Subjective:   Sheri [name], female    DOB: 03-05-1926, 86 y.o.   MRN: [id number]    Chief Complaint  Patient presents with  . Essential hypertension  . Follow-up    86 year old African-American female with hypertension, former smoker, moderate to severe COPD, WHO group 3 pulmonary hypertension.  Her COPD is managed by Dr. Katherine Roan. Blood pressure is elevated today. However, home BP log shows avg BP 142/76 mmHg. She has stable exertional dyspnea with home oxygen use. No new complaints today.    Current Outpatient Medications on File Prior to Visit  Medication Sig Dispense Refill  . BREZTRI AEROSPHERE 160-9-4.8 MCG/ACT AERO     . diclofenac sodium (VOLTAREN) 1 % GEL Apply 2 g topically 2 (two) times daily as needed (joint pain).     . ferrous sulfate 325 (65 FE) MG tablet Take 325 mg by mouth 2 (two) times daily with a meal.     . gabapentin (NEURONTIN) 100 MG capsule 2 (two) times a day.    . hydrochlorothiazide (MICROZIDE) 12.5 MG capsule TAKE 1 CAPSULE BY MOUTH EVERY DAY 135 capsule 2  . JANUVIA 100 MG tablet Take 100 mg by mouth daily.     . metoprolol succinate (TOPROL-XL) 50 MG 24 hr tablet Take 1 tablet (50 mg total) by mouth daily. Take with or immediately following a meal. 30 tablet 3  . mirtazapine (REMERON) 7.5 MG tablet Take 7.5 mg by mouth at bedtime.   3  . OXYGEN Inhale 3 L into the lungs continuous.    . pantoprazole (PROTONIX) 40 MG tablet Take 40 mg by mouth daily.    . rosuvastatin (CRESTOR) 10 MG tablet Take by mouth daily. Unaware of dose    . simvastatin (ZOCOR) 40 MG tablet Take 40 mg by mouth daily at 6 PM.     . SYMBICORT 160-4.5 MCG/ACT inhaler Inhale 2 puffs into the lungs 2 (two) times daily.   5  . Tiotropium Bromide Monohydrate (SPIRIVA RESPIMAT) 1.25 MCG/ACT AERS Inhale 2 puffs into the lungs daily. 1 Inhaler 0  . triamcinolone (KENALOG) 0.025 % ointment     . verapamil (CALAN-SR) 240 MG CR tablet TAKE 1 TABLET BY MOUTH AT  BEDTIME. 90 tablet 1  . Vitamin D, Ergocalciferol, (DRISDOL) 1.25 MG (50000 UT) CAPS capsule Take 50,000 Units by mouth every 7 (seven) days. Monday     No current facility-administered medications on file prior to visit.    Cardiovascular & other pertient studies:  EKG 10/02/2020: Sinus rhythm 93 bpm  Frequent ectopic ventricular beats Left atrial enlargement  EKG 11/12/2019: Sinus rhythm 81 bpm. Nonspecific T-abnormality.   Echocardiogram 03/30/2019: Normal LV systolic function with EF 56%. Left ventricle cavity is normal in size. Mild concentric hypertrophy of the left ventricle. Hyperdynamic LV. Mild LVOT gradient likely due to hyperdynamic LV. No SAM seen.  Doppler evidence of grade I (impaired) diastolic dysfunction, normal LAP. Calculated EF 56%. Trileaflet aortic valve. Mild calcification. No significant valvular stenosis.  Mild (Grade I) aortic regurgitation. Moderate tricuspid regurgitation. Estimated pulmonary artery systolic pressure 49 mmHg. RVSP measures 49 mmHg. No significant change compared to previous study on 01/20/2018.   Recent labs: 02/11/2020: Glucose 147, BUN/Cr 24/1.96. EGFR 25. Na/K 141/3.8.  H/H 11/38. MCV 89. Platelets 260    Review of Systems  Cardiovascular: Positive for dyspnea on exertion. Negative for chest pain, leg swelling, palpitations and syncope.  Neurological: Positive for dizziness.  Vitals:   10/02/20 1531 10/02/20 1540  BP: (!) 180/85 (!) 170/73  Pulse: 89 84  SpO2: 93%      Body mass index is 25.97 kg/m. There were no vitals filed for this visit.   Objective:   Physical Exam Vitals and nursing note reviewed.  Constitutional:      Appearance: She is well-developed.  Neck:     Vascular: No JVD.  Cardiovascular:     Rate and Rhythm: Normal rate and regular rhythm.     Pulses: Intact distal pulses.     Heart sounds: Murmur heard.  Systolic murmur is present with a grade of 2/6 at the upper right sternal border.    Pulmonary:     Effort: Pulmonary effort is normal.     Breath sounds: Normal breath sounds. No wheezing or rales.        Assessment & Recommendations:   86 year old African-American female with hypertension, former smoker, moderate to severe COPD, WHO group 3 pulmonary hypertension.  Hypertension: Hyperdynamic LV with mild LVOT obstruction (Echocardiogram 03/2019). Well controlled at homr on metoprolol succinate 50 mg daily, verapamil 240 mg.  Okay to continue low dose HCTZ in absence of any lightheadedness symptoms. I suspect white coat syndrome. No changes made today.  Pulmonary hypertension: WHO Grp III. Continue management of COPD as per PCP.   F/u in 1 year after repeat echocardiogram.    Nigel Mormon, MD [facility] [place] Pager: [phone] Office: [phone]

## 2020-10-06 DIAGNOSIS — E1165 Type 2 diabetes mellitus with hyperglycemia: Secondary | ICD-10-CM | POA: Diagnosis not present

## 2020-10-06 DIAGNOSIS — R200 Anesthesia of skin: Secondary | ICD-10-CM | POA: Diagnosis not present

## 2020-10-06 DIAGNOSIS — J441 Chronic obstructive pulmonary disease with (acute) exacerbation: Secondary | ICD-10-CM | POA: Diagnosis not present

## 2020-10-06 DIAGNOSIS — E559 Vitamin D deficiency, unspecified: Secondary | ICD-10-CM | POA: Diagnosis not present

## 2020-10-06 DIAGNOSIS — E7800 Pure hypercholesterolemia, unspecified: Secondary | ICD-10-CM | POA: Diagnosis not present

## 2020-10-15 ENCOUNTER — Other Ambulatory Visit: Payer: Self-pay | Admitting: Cardiology

## 2020-10-15 DIAGNOSIS — I10 Essential (primary) hypertension: Secondary | ICD-10-CM

## 2020-10-16 DIAGNOSIS — J449 Chronic obstructive pulmonary disease, unspecified: Secondary | ICD-10-CM | POA: Diagnosis not present

## 2020-10-20 DIAGNOSIS — E1142 Type 2 diabetes mellitus with diabetic polyneuropathy: Secondary | ICD-10-CM | POA: Diagnosis not present

## 2020-10-20 DIAGNOSIS — J441 Chronic obstructive pulmonary disease with (acute) exacerbation: Secondary | ICD-10-CM | POA: Diagnosis not present

## 2020-10-28 ENCOUNTER — Encounter (HOSPITAL_COMMUNITY): Payer: Self-pay

## 2020-10-28 ENCOUNTER — Emergency Department (HOSPITAL_COMMUNITY): Payer: Medicare Other

## 2020-10-28 ENCOUNTER — Other Ambulatory Visit: Payer: Self-pay

## 2020-10-28 ENCOUNTER — Emergency Department (HOSPITAL_COMMUNITY)
Admission: EM | Admit: 2020-10-28 | Discharge: 2020-10-28 | Disposition: A | Discharge status: Home / Self Care | Payer: Medicare Other | Attending: Emergency Medicine | Admitting: Emergency Medicine

## 2020-10-28 DIAGNOSIS — I10 Essential (primary) hypertension: Secondary | ICD-10-CM | POA: Diagnosis not present

## 2020-10-28 DIAGNOSIS — J432 Centrilobular emphysema: Secondary | ICD-10-CM | POA: Diagnosis not present

## 2020-10-28 DIAGNOSIS — M2578 Osteophyte, vertebrae: Secondary | ICD-10-CM | POA: Diagnosis not present

## 2020-10-28 DIAGNOSIS — Z79899 Other long term (current) drug therapy: Secondary | ICD-10-CM | POA: Insufficient documentation

## 2020-10-28 DIAGNOSIS — Z87891 Personal history of nicotine dependence: Secondary | ICD-10-CM | POA: Insufficient documentation

## 2020-10-28 DIAGNOSIS — M542 Cervicalgia: Secondary | ICD-10-CM | POA: Diagnosis not present

## 2020-10-28 DIAGNOSIS — S3992XA Unspecified injury of lower back, initial encounter: Secondary | ICD-10-CM | POA: Diagnosis present

## 2020-10-28 DIAGNOSIS — R519 Headache, unspecified: Secondary | ICD-10-CM | POA: Diagnosis not present

## 2020-10-28 DIAGNOSIS — M545 Low back pain, unspecified: Secondary | ICD-10-CM | POA: Insufficient documentation

## 2020-10-28 DIAGNOSIS — I700 Atherosclerosis of aorta: Secondary | ICD-10-CM | POA: Diagnosis not present

## 2020-10-28 DIAGNOSIS — W01198A Fall on same level from slipping, tripping and stumbling with subsequent striking against other object, initial encounter: Secondary | ICD-10-CM | POA: Insufficient documentation

## 2020-10-28 DIAGNOSIS — J449 Chronic obstructive pulmonary disease, unspecified: Secondary | ICD-10-CM | POA: Diagnosis not present

## 2020-10-28 DIAGNOSIS — M47814 Spondylosis without myelopathy or radiculopathy, thoracic region: Secondary | ICD-10-CM | POA: Diagnosis not present

## 2020-10-28 DIAGNOSIS — S22080A Wedge compression fracture of T11-T12 vertebra, initial encounter for closed fracture: Secondary | ICD-10-CM | POA: Diagnosis not present

## 2020-10-28 DIAGNOSIS — J439 Emphysema, unspecified: Secondary | ICD-10-CM | POA: Diagnosis not present

## 2020-10-28 DIAGNOSIS — R222 Localized swelling, mass and lump, trunk: Secondary | ICD-10-CM | POA: Diagnosis not present

## 2020-10-28 DIAGNOSIS — M4802 Spinal stenosis, cervical region: Secondary | ICD-10-CM | POA: Diagnosis not present

## 2020-10-28 LAB — CBC WITH DIFFERENTIAL/PLATELET
Abs Immature Granulocytes: 0.03 10*3/uL (ref 0.00–0.07)
Basophils Absolute: 0 10*3/uL (ref 0.0–0.1)
Basophils Relative: 0 %
Eosinophils Absolute: 0 10*3/uL (ref 0.0–0.5)
Eosinophils Relative: 0 %
HCT: 34.6 % — ABNORMAL LOW (ref 36.0–46.0)
Hemoglobin: 10.4 g/dL — ABNORMAL LOW (ref 12.0–15.0)
Immature Granulocytes: 0 %
Lymphocytes Relative: 20 %
Lymphs Abs: 1.7 10*3/uL (ref 0.7–4.0)
MCH: 26.9 pg (ref 26.0–34.0)
MCHC: 30.1 g/dL (ref 30.0–36.0)
MCV: 89.6 fL (ref 80.0–100.0)
Monocytes Absolute: 0.7 10*3/uL (ref 0.1–1.0)
Monocytes Relative: 8 %
Neutro Abs: 6.4 10*3/uL (ref 1.7–7.7)
Neutrophils Relative %: 72 %
Platelets: 296 10*3/uL (ref 150–400)
RBC: 3.86 MIL/uL — ABNORMAL LOW (ref 3.87–5.11)
RDW: 15.2 % (ref 11.5–15.5)
WBC: 8.9 10*3/uL (ref 4.0–10.5)
nRBC: 0 % (ref 0.0–0.2)

## 2020-10-28 LAB — BASIC METABOLIC PANEL
Anion gap: 10 (ref 5–15)
BUN: 15 mg/dL (ref 8–23)
CO2: 31 mmol/L (ref 22–32)
Calcium: 9.3 mg/dL (ref 8.9–10.3)
Chloride: 101 mmol/L (ref 98–111)
Creatinine, Ser: 1.3 mg/dL — ABNORMAL HIGH (ref 0.44–1.00)
GFR, Estimated: 39 mL/min — ABNORMAL LOW (ref >60)
Glucose, Bld: 111 mg/dL — ABNORMAL HIGH (ref 70–99)
Potassium: 4.2 mmol/L (ref 3.5–5.1)
Sodium: 142 mmol/L (ref 135–145)

## 2020-10-28 MED ORDER — DICLOFENAC SODIUM 1 % EX GEL: 4.0000 g | Freq: Four times a day (QID) | CUTANEOUS | 0 refills | Status: DC

## 2020-10-28 MED ORDER — ACETAMINOPHEN 500 MG PO TABS
1000.0000 mg | ORAL_TABLET | Freq: Once | ORAL | Status: AC
  Administered 2020-10-28: 1000 mg via ORAL
  Filled 2020-10-28: qty 2

## 2020-10-28 NOTE — ED Triage Notes (Signed)
Patient fell 1 week ago and complainging of headache and ongoing lower back pain. Seen by primary for same. Alert on arrival, NAD

## 2020-10-28 NOTE — ED Notes (Signed)
Patient verbalizes understanding of discharge instructions. Opportunity for questioning and answers were provided. Armband removed by staff, pt discharged from ED in wheelchair to home.   

## 2020-10-28 NOTE — Discharge Instructions (Signed)
Take tylenol 1000mg (2 extra strength) four times a day. Use the gel as prescribed.  Follow up with your family doc and neurosurgeon in the office.   Return for worsening pain, fever, leg weakness or numbness, difficulty urinating.

## 2020-10-28 NOTE — ED Notes (Signed)
Patient transported to CT 

## 2020-10-28 NOTE — ED Provider Notes (Signed)
[facility] EMERGENCY DEPARTMENT Provider Note   CSN: [id number] Arrival date & time: 10/28/20  1352     History Chief Complaint  Patient presents with  . fall 1 week ago    Sheri [name] is a 86 y.o. female.  86 yo F with a chief complaint of a fall. This occurred about a week ago. She was walking around to table and fell to the ground. She has trouble knowing exactly what happens but she thinks maybe that she lost her balance. She struck her head and has been complaining of pain to her back from her neck all the way down to the lower back but worse at the lower back. She denies abdominal pain denies chest pain denies trouble breathing. Denies extremity pain. She feels like she is mildly better now than she was at the beginning of the week. They tried to get in with her family doctor but she is out of town due to the holidays.  The history is provided by the patient and a relative.  Fall This is a new problem. The current episode started more than 2 days ago. The problem occurs rarely. The problem has been resolved. Associated symptoms include headaches. Pertinent negatives include no chest pain and no shortness of breath. The symptoms are aggravated by bending and twisting. Nothing relieves the symptoms. She has tried nothing for the symptoms. The treatment provided no relief.       Past Medical History:  Diagnosis Date  . Bronchitis   . COPD (chronic obstructive pulmonary disease) (HCC)   . Hypertension     Patient Active Problem List   Diagnosis Date Noted  . Pulmonary hypertension (HCC) 04/18/2019  . Chronic respiratory failure with hypoxia (HCC) 03/09/2018  . Gait difficulty 02/06/2018  . Chronic obstructive pulmonary disease (HCC) 01/04/2015  . Former tobacco use 01/04/2015  . Essential hypertension 01/04/2015  . Acute renal failure (HCC) 01/04/2015  . Anemia 01/04/2015    Past Surgical History:  Procedure Laterality Date  . EYE SURGERY    .  TUBAL LIGATION       OB History   No obstetric history on file.     Family History  Problem Relation Age of Onset  . Breast cancer Other   . Heart attack Mother   . Heart attack Father     Social History   Tobacco Use  . Smoking status: Former Smoker    Packs/day: 0.50    Types: Cigarettes    Quit date: 01/03/2002    Years since quitting: 18.8  . Smokeless tobacco: Never Used  . Tobacco comment: teenager  Vaping Use  . Vaping Use: Never used  Substance Use Topics  . Alcohol use: No  . Drug use: No    Home Medications Prior to Admission medications   Medication Sig Start Date End Date Taking? Authorizing Provider  diclofenac Sodium (VOLTAREN) 1 % GEL Apply 4 g topically 4 (four) times daily. 10/28/20  Yes Melene Plan, DO  BREZTRI AEROSPHERE 160-9-4.8 MCG/ACT AERO  04/01/20   [provider]  COMBIVENT RESPIMAT 20-100 MCG/ACT AERS respimat Inhale 1 puff into the lungs 4 (four) times daily. 09/26/20   [provider]  diclofenac sodium (VOLTAREN) 1 % GEL Apply 2 g topically 2 (two) times daily as needed (joint pain).  03/25/19   [provider]  ferrous sulfate 325 (65 FE) MG tablet Take 325 mg by mouth 2 (two) times daily with a meal.  01/23/19  [provider]  gabapentin (NEURONTIN) 100 MG capsule 2 (two) times a day. 02/08/19   [provider]  hydrochlorothiazide (MICROZIDE) 12.5 MG capsule TAKE 1 CAPSULE BY MOUTH EVERY DAY 03/12/20   Patwardhan, Manish J, MD  JANUVIA 100 MG tablet Take 100 mg by mouth daily.  02/20/19   [provider]  metoprolol succinate (TOPROL-XL) 50 MG 24 hr tablet TAKE 1 TABLET (50 MG TOTAL) BY MOUTH DAILY. TAKE WITH OR IMMEDIATELY FOLLOWING A MEAL. 10/15/20 01/13/21  Patwardhan, Anabel Bene, MD  mirtazapine (REMERON) 7.5 MG tablet Take 7.5 mg by mouth at bedtime.  01/16/18   [provider]  OXYGEN Inhale 3 L into the lungs continuous.    [provider]  pantoprazole (PROTONIX) 40 MG  tablet Take 40 mg by mouth daily.    [provider]  rosuvastatin (CRESTOR) 20 MG tablet Take 20 mg by mouth at bedtime. 07/20/20   [provider]  SYMBICORT 160-4.5 MCG/ACT inhaler Inhale 2 puffs into the lungs 2 (two) times daily.  01/30/18   [provider]  Tiotropium Bromide Monohydrate (SPIRIVA RESPIMAT) 1.25 MCG/ACT AERS Inhale 2 puffs into the lungs daily. 02/06/18   Oretha Milch, MD  triamcinolone (KENALOG) 0.025 % ointment  02/18/20   [provider]  verapamil (CALAN-SR) 240 MG CR tablet TAKE 1 TABLET BY MOUTH AT BEDTIME. 07/30/20   Patwardhan, Manish J, MD  Vitamin D, Ergocalciferol, (DRISDOL) 1.25 MG (50000 UT) CAPS capsule Take 50,000 Units by mouth every 7 (seven) days. Monday 02/13/19   [provider]    Allergies    Arlice Colt isothiocyanate]  Review of Systems   Review of Systems  Constitutional: Negative for chills and fever.  HENT: Negative for congestion and rhinorrhea.   Eyes: Negative for redness and visual disturbance.  Respiratory: Negative for shortness of breath and wheezing.   Cardiovascular: Negative for chest pain and palpitations.  Gastrointestinal: Negative for nausea and vomiting.  Genitourinary: Negative for dysuria and urgency.  Musculoskeletal: Positive for back pain and neck pain. Negative for arthralgias and myalgias.  Skin: Negative for pallor and wound.  Neurological: Positive for headaches. Negative for dizziness.    Physical Exam Updated Vital Signs BP 135/80 (BP Location: Left Arm)   Pulse 80   Temp 98.5 F (36.9 C) (Oral)   Resp 20   SpO2 98%   Physical Exam Vitals and nursing note reviewed.  Constitutional:      General: She is not in acute distress.    Appearance: She is well-developed and well-nourished. She is not diaphoretic.  HENT:     Head: Normocephalic and atraumatic.  Eyes:     Extraocular Movements: EOM normal.     Pupils: Pupils are equal, round, and reactive to light.   Cardiovascular:     Rate and Rhythm: Normal rate and regular rhythm.     Heart sounds: No murmur heard. No friction rub. No gallop.   Pulmonary:     Effort: Pulmonary effort is normal.     Breath sounds: No wheezing or rales.  Abdominal:     General: There is no distension.     Palpations: Abdomen is soft.     Tenderness: There is no abdominal tenderness.  Musculoskeletal:        General: Tenderness present. No edema.     Cervical back: Normal range of motion and neck supple.     Comments: Palpated from head to toe. Patient has pain along the midline lower L-spine  between L3-5. She has some mild C-spine tenderness about C5-8. She is able to move her neck all around without difficulty.  Skin:    General: Skin is warm and dry.  Neurological:     Mental Status: She is alert and oriented to person, place, and time.  Psychiatric:        Mood and Affect: Mood and affect normal.        Behavior: Behavior normal.     ED Results / Procedures / Treatments   Labs (all labs ordered are listed, but only abnormal results are displayed) Labs Reviewed  CBC WITH DIFFERENTIAL/PLATELET - Abnormal; Notable for the following components:      Result Value   RBC 3.86 (*)    Hemoglobin 10.4 (*)    HCT 34.6 (*)    All other components within normal limits  BASIC METABOLIC PANEL - Abnormal; Notable for the following components:   Glucose, Bld 111 (*)    Creatinine, Ser 1.30 (*)    GFR, Estimated 39 (*)    All other components within normal limits    EKG None  Radiology CT Head Wo Contrast  Result Date: 10/28/2020 CLINICAL DATA:  86 year old post fall 1 week ago.  Headache. EXAM: CT HEAD WITHOUT CONTRAST TECHNIQUE: Contiguous axial images were obtained from the base of the skull through the vertex without intravenous contrast. COMPARISON:  None. FINDINGS: Brain: Age related atrophy and chronic small vessel ischemia. No intracranial hemorrhage, mass effect, or midline shift. No hydrocephalus.  The basilar cisterns are patent. No evidence of territorial infarct or acute ischemia. No extra-axial or intracranial fluid collection. Vascular: Atherosclerosis of skullbase vasculature without hyperdense vessel or abnormal calcification. Skull: No acute fracture. Nonspecific sclerotic density just superior to the left frontal sinus in the frontal bone. Sinuses/Orbits: Paranasal sinuses and mastoid air cells are clear. The visualized orbits are unremarkable. Prior right cataract resection Other: Small left frontal scalp lipoma.  No confluent hematoma. IMPRESSION: 1. No acute intracranial abnormality. No skull fracture. 2. Age related atrophy and chronic small vessel ischemia. 3. Left frontal calvarial sclerotic density is likely incidental in the absence of known malignancy. Electronically Signed   By: Narda Rutherford M.D.   On: 10/28/2020 20:07   CT Cervical Spine Wo Contrast  Result Date: 10/28/2020 CLINICAL DATA:  Fall 1 week ago with headache. EXAM: CT CERVICAL SPINE WITHOUT CONTRAST TECHNIQUE: Multidetector CT imaging of the cervical spine was performed without intravenous contrast. Multiplanar CT image reconstructions were also generated. COMPARISON:  None. FINDINGS: Alignment: Exaggerated cervical lordosis. Trace retrolisthesis of C3 on C4 is likely degenerative. No traumatic subluxation. Skull base and vertebrae: No acute fracture. Vertebral body heights are maintained. The dens and skull base are intact. Soft tissues and spinal canal: No prevertebral fluid or swelling. No visible canal hematoma. Disc levels: Diffuse degenerative disc disease. Disc space narrowing and endplate spurring is most prominent at C4-C5. Peripherally calcified disc osteophyte complex at C6-C7. Moderately large anterior spurs throughout. Multilevel facet hypertrophy. Multifactorial spinal canal narrowing at C3-C4 and C4-C5. Multilevel neural foraminal narrowing. Upper chest: Emphysema.  No acute findings. Other: None.  IMPRESSION: 1. No acute fracture or subluxation of the cervical spine. 2. Diffuse degenerative disc disease and facet hypertrophy. Multifactorial spinal canal narrowing at C3-C4 and C4-C5. Multilevel neural foraminal narrowing. Electronically Signed   By: Narda Rutherford M.D.   On: 10/28/2020 20:11   CT Thoracic Spine Wo Contrast  Result Date: 10/28/2020 CLINICAL DATA:  Fall and pain EXAM: CT THORACIC  SPINE WITHOUT CONTRAST TECHNIQUE: Multidetector CT images of the thoracic were obtained using the standard protocol without intravenous contrast. COMPARISON:  None. FINDINGS: Alignment: Normal Vertebrae: There is a probable acute/subacute compression deformity of the superior T11 vertebral body with less than 25% loss in height. There is slight buckling of the anterior superior cortex. No retropulsion of fragments is noted. There is diffuse osteopenia. Degenerative changes are seen throughout the thoracic spine with anterior flowing osteophytes in the upper thoracic spine. Paraspinal and other soft tissues: Mild prevertebral soft tissue swelling is seen at the T11 vertebral body. There is aortic atherosclerosis seen throughout. Centrilobular emphysematous changes are seen at both lung apices. Disc levels: No significant canal or neural foraminal narrowing is noted. IMPRESSION: Acute/subacute compression deformity of the superior T11 vertebral body with less than 25% loss in height. There is slight buckling of the anterior superior cortex. Electronically Signed   By: Jonna ClarkBindu  Avutu M.D.   On: 10/28/2020 20:16   CT Lumbar Spine Wo Contrast  Result Date: 10/28/2020 CLINICAL DATA:  Larey SeatFell 1 week ago with low back pain EXAM: CT LUMBAR SPINE WITHOUT CONTRAST TECHNIQUE: Multidetector CT imaging of the lumbar spine was performed without intravenous contrast administration. Multiplanar CT image reconstructions were also generated. COMPARISON:  Radiography 11/26/2014 FINDINGS: Segmentation: 5 lumbar type vertebral bodies.  Alignment: Normal except for 3 mm of degenerative anterolisthesis at L4-5. Vertebrae: No evidence of regional fracture or primary bone lesion. Paraspinal and other soft tissues: Aortic atherosclerosis. Infrarenal abdominal aortic aneurysm with maximal diameter of 4.1 cm. Peripherally calcified left renal cyst or cystic lesion measuring 2.5 cm in diameter. This was visible on radiography from 2016 and is likely benign. Disc levels: T11-12: Disc bulge.  No compressive stenosis. T12-L1: Disc bulge.  No compressive stenosis. L1-2: Disc bulge.  No compressive stenosis. L2-3: Disc bulge. Facet and ligamentous hypertrophy. Mild moderate multifactorial stenosis. L3-4: Disc bulge. Facet and ligamentous hypertrophy. Moderate multifactorial stenosis. L4-5: 3 mm of anterolisthesis. Disc bulge. Facet and ligamentous hypertrophy. Severe multifactorial spinal stenosis. L5-S1: Disc bulge. Facet and ligamentous hypertrophy. Bilateral subarticular lateral recess and neural foraminal stenosis that could cause neural compression on either or both sides. IMPRESSION: 1. No acute or traumatic finding. 2. L2-3: Mild-moderate multifactorial stenosis. 3. L3-4: Moderate multifactorial stenosis. 4. L4-5: Severe multifactorial spinal stenosis. 5. L5-S1: Bilateral subarticular lateral recess and neural foraminal stenosis that could cause neural compression on either or both sides. 6. Infrarenal abdominal aortic aneurysm measuring 4.1 cm. Recommend follow-up every 12 months and vascular consultation. This recommendation follows ACR consensus guidelines: White Paper of the ACR Incidental Findings Committee II on Vascular Findings. J Am Coll Radiol 2013; 10:789-794. Aortic Atherosclerosis (ICD10-I70.0). Electronically Signed   By: Paulina FusiMark  Shogry M.D.   On: 10/28/2020 20:09    Procedures Procedures (including critical care time)  Medications Ordered in ED Medications  acetaminophen (TYLENOL) tablet 1,000 mg (1,000 mg Oral Given 10/28/20 1925)     ED Course  I have reviewed the triage vital signs and the nursing notes.  Pertinent labs & imaging results that were available during my care of the patient were reviewed by me and considered in my medical decision making (see chart for details).    MDM Rules/Calculators/A&P                          86 yo F with a chief complaint of a fall. This happened about a week ago. Nonsyncopal by history. Having some aches and  pains afterwards. Sounds like none at the initial onset. Complaining of headache neck pain and pain all the way down her spine. With the patient's advanced age and history of cancer will obtain a CT scan of the head CT and L-spine. Blood work performed without significant anemia or electrolyte abnormality.  CT scan with the acute appearing compression fracture at T11.  Otherwise CT of the head C-spine and L-spine without obvious acute injury.  I discussed results with the patient and family.  Offered ordering a TLSO here but the patient does not want to spend more time in the emergency department.  We will have her follow-up with her family doctor.  Given follow-up for neurosurgery.  10:48 PM:  I have discussed the diagnosis/risks/treatment options with the patient and family and believe the pt to be eligible for discharge home to follow-up with PCP, neurosurgery. We also discussed returning to the ED immediately if new or worsening sx occur. We discussed the sx which are most concerning (e.g., sudden worsening pain, fever, inability to tolerate by mouth, cauda equina s/sx) that necessitate immediate return. Medications administered to the patient during their visit and any new prescriptions provided to the patient are listed below.  Medications given during this visit Medications  acetaminophen (TYLENOL) tablet 1,000 mg (1,000 mg Oral Given 10/28/20 1925)     The patient appears reasonably screen and/or stabilized for discharge and I doubt any other medical condition or other  [facility] requiring further screening, evaluation, or treatment in the ED at this time prior to discharge.   Final Clinical Impression(s) / ED Diagnoses Final diagnoses:  Closed wedge compression fracture of T11 vertebra, initial encounter (HCC)    Rx / DC Orders ED Discharge Orders         Ordered    diclofenac Sodium (VOLTAREN) 1 % GEL  4 times daily        10/28/20 2030           Melene Plan, DO 10/28/20 2248

## 2020-10-31 DIAGNOSIS — I10 Essential (primary) hypertension: Secondary | ICD-10-CM | POA: Diagnosis not present

## 2020-11-04 DIAGNOSIS — W010XXA Fall on same level from slipping, tripping and stumbling without subsequent striking against object, initial encounter: Secondary | ICD-10-CM | POA: Diagnosis not present

## 2020-11-04 DIAGNOSIS — M1250 Traumatic arthropathy, unspecified site: Secondary | ICD-10-CM | POA: Diagnosis not present

## 2020-11-04 DIAGNOSIS — J441 Chronic obstructive pulmonary disease with (acute) exacerbation: Secondary | ICD-10-CM | POA: Diagnosis not present

## 2020-11-04 DIAGNOSIS — I10 Essential (primary) hypertension: Secondary | ICD-10-CM | POA: Diagnosis not present

## 2020-11-04 DIAGNOSIS — E1142 Type 2 diabetes mellitus with diabetic polyneuropathy: Secondary | ICD-10-CM | POA: Diagnosis not present

## 2020-11-04 DIAGNOSIS — S22000A Wedge compression fracture of unspecified thoracic vertebra, initial encounter for closed fracture: Secondary | ICD-10-CM | POA: Diagnosis not present

## 2020-11-04 DIAGNOSIS — Z9981 Dependence on supplemental oxygen: Secondary | ICD-10-CM | POA: Diagnosis not present

## 2020-11-13 DIAGNOSIS — S22080D Wedge compression fracture of T11-T12 vertebra, subsequent encounter for fracture with routine healing: Secondary | ICD-10-CM | POA: Diagnosis not present

## 2020-11-13 DIAGNOSIS — Z7984 Long term (current) use of oral hypoglycemic drugs: Secondary | ICD-10-CM | POA: Diagnosis not present

## 2020-11-13 DIAGNOSIS — I10 Essential (primary) hypertension: Secondary | ICD-10-CM | POA: Diagnosis not present

## 2020-11-13 DIAGNOSIS — M48061 Spinal stenosis, lumbar region without neurogenic claudication: Secondary | ICD-10-CM | POA: Diagnosis not present

## 2020-11-13 DIAGNOSIS — Z87891 Personal history of nicotine dependence: Secondary | ICD-10-CM | POA: Diagnosis not present

## 2020-11-13 DIAGNOSIS — Z9181 History of falling: Secondary | ICD-10-CM | POA: Diagnosis not present

## 2020-11-13 DIAGNOSIS — E119 Type 2 diabetes mellitus without complications: Secondary | ICD-10-CM | POA: Diagnosis not present

## 2020-11-13 DIAGNOSIS — I700 Atherosclerosis of aorta: Secondary | ICD-10-CM | POA: Diagnosis not present

## 2020-11-13 DIAGNOSIS — H409 Unspecified glaucoma: Secondary | ICD-10-CM | POA: Diagnosis not present

## 2020-11-13 DIAGNOSIS — I272 Pulmonary hypertension, unspecified: Secondary | ICD-10-CM | POA: Diagnosis not present

## 2020-11-13 DIAGNOSIS — D649 Anemia, unspecified: Secondary | ICD-10-CM | POA: Diagnosis not present

## 2020-11-13 DIAGNOSIS — M1990 Unspecified osteoarthritis, unspecified site: Secondary | ICD-10-CM | POA: Diagnosis not present

## 2020-11-13 DIAGNOSIS — J449 Chronic obstructive pulmonary disease, unspecified: Secondary | ICD-10-CM | POA: Diagnosis not present

## 2020-11-13 DIAGNOSIS — J9611 Chronic respiratory failure with hypoxia: Secondary | ICD-10-CM | POA: Diagnosis not present

## 2020-11-16 DIAGNOSIS — J449 Chronic obstructive pulmonary disease, unspecified: Secondary | ICD-10-CM | POA: Diagnosis not present

## 2020-11-19 DIAGNOSIS — S22080D Wedge compression fracture of T11-T12 vertebra, subsequent encounter for fracture with routine healing: Secondary | ICD-10-CM | POA: Diagnosis not present

## 2020-11-19 DIAGNOSIS — Z87891 Personal history of nicotine dependence: Secondary | ICD-10-CM | POA: Diagnosis not present

## 2020-11-19 DIAGNOSIS — M1990 Unspecified osteoarthritis, unspecified site: Secondary | ICD-10-CM | POA: Diagnosis not present

## 2020-11-19 DIAGNOSIS — I272 Pulmonary hypertension, unspecified: Secondary | ICD-10-CM | POA: Diagnosis not present

## 2020-11-19 DIAGNOSIS — D649 Anemia, unspecified: Secondary | ICD-10-CM | POA: Diagnosis not present

## 2020-11-19 DIAGNOSIS — Z9181 History of falling: Secondary | ICD-10-CM | POA: Diagnosis not present

## 2020-11-19 DIAGNOSIS — M48061 Spinal stenosis, lumbar region without neurogenic claudication: Secondary | ICD-10-CM | POA: Diagnosis not present

## 2020-11-19 DIAGNOSIS — I700 Atherosclerosis of aorta: Secondary | ICD-10-CM | POA: Diagnosis not present

## 2020-11-19 DIAGNOSIS — E119 Type 2 diabetes mellitus without complications: Secondary | ICD-10-CM | POA: Diagnosis not present

## 2020-11-19 DIAGNOSIS — I10 Essential (primary) hypertension: Secondary | ICD-10-CM | POA: Diagnosis not present

## 2020-11-19 DIAGNOSIS — H409 Unspecified glaucoma: Secondary | ICD-10-CM | POA: Diagnosis not present

## 2020-11-19 DIAGNOSIS — J9611 Chronic respiratory failure with hypoxia: Secondary | ICD-10-CM | POA: Diagnosis not present

## 2020-11-19 DIAGNOSIS — Z7984 Long term (current) use of oral hypoglycemic drugs: Secondary | ICD-10-CM | POA: Diagnosis not present

## 2020-11-19 DIAGNOSIS — J449 Chronic obstructive pulmonary disease, unspecified: Secondary | ICD-10-CM | POA: Diagnosis not present

## 2020-11-26 DIAGNOSIS — H409 Unspecified glaucoma: Secondary | ICD-10-CM | POA: Diagnosis not present

## 2020-11-26 DIAGNOSIS — M48061 Spinal stenosis, lumbar region without neurogenic claudication: Secondary | ICD-10-CM | POA: Diagnosis not present

## 2020-11-26 DIAGNOSIS — I272 Pulmonary hypertension, unspecified: Secondary | ICD-10-CM | POA: Diagnosis not present

## 2020-11-26 DIAGNOSIS — E119 Type 2 diabetes mellitus without complications: Secondary | ICD-10-CM | POA: Diagnosis not present

## 2020-11-26 DIAGNOSIS — I700 Atherosclerosis of aorta: Secondary | ICD-10-CM | POA: Diagnosis not present

## 2020-11-26 DIAGNOSIS — Z7984 Long term (current) use of oral hypoglycemic drugs: Secondary | ICD-10-CM | POA: Diagnosis not present

## 2020-11-26 DIAGNOSIS — D649 Anemia, unspecified: Secondary | ICD-10-CM | POA: Diagnosis not present

## 2020-11-26 DIAGNOSIS — J449 Chronic obstructive pulmonary disease, unspecified: Secondary | ICD-10-CM | POA: Diagnosis not present

## 2020-11-26 DIAGNOSIS — Z87891 Personal history of nicotine dependence: Secondary | ICD-10-CM | POA: Diagnosis not present

## 2020-11-26 DIAGNOSIS — J9611 Chronic respiratory failure with hypoxia: Secondary | ICD-10-CM | POA: Diagnosis not present

## 2020-11-26 DIAGNOSIS — Z9181 History of falling: Secondary | ICD-10-CM | POA: Diagnosis not present

## 2020-11-26 DIAGNOSIS — S22080D Wedge compression fracture of T11-T12 vertebra, subsequent encounter for fracture with routine healing: Secondary | ICD-10-CM | POA: Diagnosis not present

## 2020-11-26 DIAGNOSIS — I10 Essential (primary) hypertension: Secondary | ICD-10-CM | POA: Diagnosis not present

## 2020-11-26 DIAGNOSIS — M1990 Unspecified osteoarthritis, unspecified site: Secondary | ICD-10-CM | POA: Diagnosis not present

## 2020-11-28 DIAGNOSIS — I272 Pulmonary hypertension, unspecified: Secondary | ICD-10-CM | POA: Diagnosis not present

## 2020-11-28 DIAGNOSIS — H409 Unspecified glaucoma: Secondary | ICD-10-CM | POA: Diagnosis not present

## 2020-11-28 DIAGNOSIS — Z9181 History of falling: Secondary | ICD-10-CM | POA: Diagnosis not present

## 2020-11-28 DIAGNOSIS — D649 Anemia, unspecified: Secondary | ICD-10-CM | POA: Diagnosis not present

## 2020-11-28 DIAGNOSIS — I700 Atherosclerosis of aorta: Secondary | ICD-10-CM | POA: Diagnosis not present

## 2020-11-28 DIAGNOSIS — M48061 Spinal stenosis, lumbar region without neurogenic claudication: Secondary | ICD-10-CM | POA: Diagnosis not present

## 2020-11-28 DIAGNOSIS — S22080D Wedge compression fracture of T11-T12 vertebra, subsequent encounter for fracture with routine healing: Secondary | ICD-10-CM | POA: Diagnosis not present

## 2020-11-28 DIAGNOSIS — J449 Chronic obstructive pulmonary disease, unspecified: Secondary | ICD-10-CM | POA: Diagnosis not present

## 2020-11-28 DIAGNOSIS — E119 Type 2 diabetes mellitus without complications: Secondary | ICD-10-CM | POA: Diagnosis not present

## 2020-11-28 DIAGNOSIS — I10 Essential (primary) hypertension: Secondary | ICD-10-CM | POA: Diagnosis not present

## 2020-11-28 DIAGNOSIS — Z87891 Personal history of nicotine dependence: Secondary | ICD-10-CM | POA: Diagnosis not present

## 2020-11-28 DIAGNOSIS — J9611 Chronic respiratory failure with hypoxia: Secondary | ICD-10-CM | POA: Diagnosis not present

## 2020-11-28 DIAGNOSIS — Z7984 Long term (current) use of oral hypoglycemic drugs: Secondary | ICD-10-CM | POA: Diagnosis not present

## 2020-11-28 DIAGNOSIS — M1990 Unspecified osteoarthritis, unspecified site: Secondary | ICD-10-CM | POA: Diagnosis not present

## 2020-12-03 DIAGNOSIS — E119 Type 2 diabetes mellitus without complications: Secondary | ICD-10-CM | POA: Diagnosis not present

## 2020-12-03 DIAGNOSIS — J9611 Chronic respiratory failure with hypoxia: Secondary | ICD-10-CM | POA: Diagnosis not present

## 2020-12-03 DIAGNOSIS — J449 Chronic obstructive pulmonary disease, unspecified: Secondary | ICD-10-CM | POA: Diagnosis not present

## 2020-12-03 DIAGNOSIS — Z7984 Long term (current) use of oral hypoglycemic drugs: Secondary | ICD-10-CM | POA: Diagnosis not present

## 2020-12-03 DIAGNOSIS — I700 Atherosclerosis of aorta: Secondary | ICD-10-CM | POA: Diagnosis not present

## 2020-12-03 DIAGNOSIS — I10 Essential (primary) hypertension: Secondary | ICD-10-CM | POA: Diagnosis not present

## 2020-12-03 DIAGNOSIS — Z9181 History of falling: Secondary | ICD-10-CM | POA: Diagnosis not present

## 2020-12-03 DIAGNOSIS — D649 Anemia, unspecified: Secondary | ICD-10-CM | POA: Diagnosis not present

## 2020-12-03 DIAGNOSIS — H409 Unspecified glaucoma: Secondary | ICD-10-CM | POA: Diagnosis not present

## 2020-12-03 DIAGNOSIS — M48061 Spinal stenosis, lumbar region without neurogenic claudication: Secondary | ICD-10-CM | POA: Diagnosis not present

## 2020-12-03 DIAGNOSIS — S22080D Wedge compression fracture of T11-T12 vertebra, subsequent encounter for fracture with routine healing: Secondary | ICD-10-CM | POA: Diagnosis not present

## 2020-12-03 DIAGNOSIS — Z87891 Personal history of nicotine dependence: Secondary | ICD-10-CM | POA: Diagnosis not present

## 2020-12-03 DIAGNOSIS — I272 Pulmonary hypertension, unspecified: Secondary | ICD-10-CM | POA: Diagnosis not present

## 2020-12-03 DIAGNOSIS — M1990 Unspecified osteoarthritis, unspecified site: Secondary | ICD-10-CM | POA: Diagnosis not present

## 2020-12-05 DIAGNOSIS — Z9981 Dependence on supplemental oxygen: Secondary | ICD-10-CM | POA: Diagnosis not present

## 2020-12-05 DIAGNOSIS — J441 Chronic obstructive pulmonary disease with (acute) exacerbation: Secondary | ICD-10-CM | POA: Diagnosis not present

## 2020-12-05 DIAGNOSIS — M130 Polyarthritis, unspecified: Secondary | ICD-10-CM | POA: Diagnosis not present

## 2020-12-05 DIAGNOSIS — E1169 Type 2 diabetes mellitus with other specified complication: Secondary | ICD-10-CM | POA: Diagnosis not present

## 2020-12-05 DIAGNOSIS — I10 Essential (primary) hypertension: Secondary | ICD-10-CM | POA: Diagnosis not present

## 2020-12-11 DIAGNOSIS — S22080D Wedge compression fracture of T11-T12 vertebra, subsequent encounter for fracture with routine healing: Secondary | ICD-10-CM | POA: Diagnosis not present

## 2020-12-11 DIAGNOSIS — J449 Chronic obstructive pulmonary disease, unspecified: Secondary | ICD-10-CM | POA: Diagnosis not present

## 2020-12-11 DIAGNOSIS — M1990 Unspecified osteoarthritis, unspecified site: Secondary | ICD-10-CM | POA: Diagnosis not present

## 2020-12-11 DIAGNOSIS — Z7984 Long term (current) use of oral hypoglycemic drugs: Secondary | ICD-10-CM | POA: Diagnosis not present

## 2020-12-11 DIAGNOSIS — J9611 Chronic respiratory failure with hypoxia: Secondary | ICD-10-CM | POA: Diagnosis not present

## 2020-12-11 DIAGNOSIS — I700 Atherosclerosis of aorta: Secondary | ICD-10-CM | POA: Diagnosis not present

## 2020-12-11 DIAGNOSIS — H409 Unspecified glaucoma: Secondary | ICD-10-CM | POA: Diagnosis not present

## 2020-12-11 DIAGNOSIS — Z9181 History of falling: Secondary | ICD-10-CM | POA: Diagnosis not present

## 2020-12-11 DIAGNOSIS — E119 Type 2 diabetes mellitus without complications: Secondary | ICD-10-CM | POA: Diagnosis not present

## 2020-12-11 DIAGNOSIS — I272 Pulmonary hypertension, unspecified: Secondary | ICD-10-CM | POA: Diagnosis not present

## 2020-12-11 DIAGNOSIS — I10 Essential (primary) hypertension: Secondary | ICD-10-CM | POA: Diagnosis not present

## 2020-12-11 DIAGNOSIS — Z87891 Personal history of nicotine dependence: Secondary | ICD-10-CM | POA: Diagnosis not present

## 2020-12-11 DIAGNOSIS — M48061 Spinal stenosis, lumbar region without neurogenic claudication: Secondary | ICD-10-CM | POA: Diagnosis not present

## 2020-12-11 DIAGNOSIS — D649 Anemia, unspecified: Secondary | ICD-10-CM | POA: Diagnosis not present

## 2020-12-17 ENCOUNTER — Other Ambulatory Visit: Payer: Self-pay | Admitting: Cardiology

## 2020-12-17 DIAGNOSIS — J449 Chronic obstructive pulmonary disease, unspecified: Secondary | ICD-10-CM | POA: Diagnosis not present

## 2020-12-17 DIAGNOSIS — I10 Essential (primary) hypertension: Secondary | ICD-10-CM

## 2020-12-24 DIAGNOSIS — J449 Chronic obstructive pulmonary disease, unspecified: Secondary | ICD-10-CM | POA: Diagnosis not present

## 2020-12-24 DIAGNOSIS — Z7984 Long term (current) use of oral hypoglycemic drugs: Secondary | ICD-10-CM | POA: Diagnosis not present

## 2020-12-24 DIAGNOSIS — H409 Unspecified glaucoma: Secondary | ICD-10-CM | POA: Diagnosis not present

## 2020-12-24 DIAGNOSIS — S22080D Wedge compression fracture of T11-T12 vertebra, subsequent encounter for fracture with routine healing: Secondary | ICD-10-CM | POA: Diagnosis not present

## 2020-12-24 DIAGNOSIS — J9611 Chronic respiratory failure with hypoxia: Secondary | ICD-10-CM | POA: Diagnosis not present

## 2020-12-24 DIAGNOSIS — D649 Anemia, unspecified: Secondary | ICD-10-CM | POA: Diagnosis not present

## 2020-12-24 DIAGNOSIS — M48061 Spinal stenosis, lumbar region without neurogenic claudication: Secondary | ICD-10-CM | POA: Diagnosis not present

## 2020-12-24 DIAGNOSIS — M1990 Unspecified osteoarthritis, unspecified site: Secondary | ICD-10-CM | POA: Diagnosis not present

## 2020-12-24 DIAGNOSIS — Z87891 Personal history of nicotine dependence: Secondary | ICD-10-CM | POA: Diagnosis not present

## 2020-12-24 DIAGNOSIS — Z9181 History of falling: Secondary | ICD-10-CM | POA: Diagnosis not present

## 2020-12-24 DIAGNOSIS — I700 Atherosclerosis of aorta: Secondary | ICD-10-CM | POA: Diagnosis not present

## 2020-12-24 DIAGNOSIS — I10 Essential (primary) hypertension: Secondary | ICD-10-CM | POA: Diagnosis not present

## 2020-12-24 DIAGNOSIS — I272 Pulmonary hypertension, unspecified: Secondary | ICD-10-CM | POA: Diagnosis not present

## 2020-12-24 DIAGNOSIS — E119 Type 2 diabetes mellitus without complications: Secondary | ICD-10-CM | POA: Diagnosis not present

## 2021-01-01 DIAGNOSIS — I10 Essential (primary) hypertension: Secondary | ICD-10-CM | POA: Diagnosis not present

## 2021-01-07 DIAGNOSIS — M1990 Unspecified osteoarthritis, unspecified site: Secondary | ICD-10-CM | POA: Diagnosis not present

## 2021-01-07 DIAGNOSIS — Z7984 Long term (current) use of oral hypoglycemic drugs: Secondary | ICD-10-CM | POA: Diagnosis not present

## 2021-01-07 DIAGNOSIS — Z9181 History of falling: Secondary | ICD-10-CM | POA: Diagnosis not present

## 2021-01-07 DIAGNOSIS — I272 Pulmonary hypertension, unspecified: Secondary | ICD-10-CM | POA: Diagnosis not present

## 2021-01-07 DIAGNOSIS — Z87891 Personal history of nicotine dependence: Secondary | ICD-10-CM | POA: Diagnosis not present

## 2021-01-07 DIAGNOSIS — J9611 Chronic respiratory failure with hypoxia: Secondary | ICD-10-CM | POA: Diagnosis not present

## 2021-01-07 DIAGNOSIS — E119 Type 2 diabetes mellitus without complications: Secondary | ICD-10-CM | POA: Diagnosis not present

## 2021-01-07 DIAGNOSIS — I10 Essential (primary) hypertension: Secondary | ICD-10-CM | POA: Diagnosis not present

## 2021-01-07 DIAGNOSIS — I700 Atherosclerosis of aorta: Secondary | ICD-10-CM | POA: Diagnosis not present

## 2021-01-07 DIAGNOSIS — S22080D Wedge compression fracture of T11-T12 vertebra, subsequent encounter for fracture with routine healing: Secondary | ICD-10-CM | POA: Diagnosis not present

## 2021-01-07 DIAGNOSIS — D649 Anemia, unspecified: Secondary | ICD-10-CM | POA: Diagnosis not present

## 2021-01-07 DIAGNOSIS — H409 Unspecified glaucoma: Secondary | ICD-10-CM | POA: Diagnosis not present

## 2021-01-07 DIAGNOSIS — J449 Chronic obstructive pulmonary disease, unspecified: Secondary | ICD-10-CM | POA: Diagnosis not present

## 2021-01-07 DIAGNOSIS — M48061 Spinal stenosis, lumbar region without neurogenic claudication: Secondary | ICD-10-CM | POA: Diagnosis not present

## 2021-01-14 DIAGNOSIS — J449 Chronic obstructive pulmonary disease, unspecified: Secondary | ICD-10-CM | POA: Diagnosis not present

## 2021-01-15 DIAGNOSIS — I10 Essential (primary) hypertension: Secondary | ICD-10-CM | POA: Diagnosis not present

## 2021-01-15 DIAGNOSIS — E1169 Type 2 diabetes mellitus with other specified complication: Secondary | ICD-10-CM | POA: Diagnosis not present

## 2021-01-15 DIAGNOSIS — M130 Polyarthritis, unspecified: Secondary | ICD-10-CM | POA: Diagnosis not present

## 2021-01-15 DIAGNOSIS — J449 Chronic obstructive pulmonary disease, unspecified: Secondary | ICD-10-CM | POA: Diagnosis not present

## 2021-01-21 ENCOUNTER — Other Ambulatory Visit: Payer: Self-pay | Admitting: Cardiology

## 2021-01-21 DIAGNOSIS — I10 Essential (primary) hypertension: Secondary | ICD-10-CM

## 2021-01-23 ENCOUNTER — Telehealth: Payer: Self-pay

## 2021-01-23 NOTE — Telephone Encounter (Signed)
done

## 2021-02-02 DIAGNOSIS — E559 Vitamin D deficiency, unspecified: Secondary | ICD-10-CM | POA: Diagnosis not present

## 2021-02-02 DIAGNOSIS — J441 Chronic obstructive pulmonary disease with (acute) exacerbation: Secondary | ICD-10-CM | POA: Diagnosis not present

## 2021-02-02 DIAGNOSIS — R200 Anesthesia of skin: Secondary | ICD-10-CM | POA: Diagnosis not present

## 2021-02-02 DIAGNOSIS — E7800 Pure hypercholesterolemia, unspecified: Secondary | ICD-10-CM | POA: Diagnosis not present

## 2021-02-02 DIAGNOSIS — I119 Hypertensive heart disease without heart failure: Secondary | ICD-10-CM | POA: Diagnosis not present

## 2021-02-02 DIAGNOSIS — E1165 Type 2 diabetes mellitus with hyperglycemia: Secondary | ICD-10-CM | POA: Diagnosis not present

## 2021-02-02 DIAGNOSIS — Z79899 Other long term (current) drug therapy: Secondary | ICD-10-CM | POA: Diagnosis not present

## 2021-02-14 DIAGNOSIS — J449 Chronic obstructive pulmonary disease, unspecified: Secondary | ICD-10-CM | POA: Diagnosis not present

## 2021-02-19 DIAGNOSIS — H53032 Strabismic amblyopia, left eye: Secondary | ICD-10-CM | POA: Diagnosis not present

## 2021-02-19 DIAGNOSIS — Z961 Presence of intraocular lens: Secondary | ICD-10-CM | POA: Diagnosis not present

## 2021-02-19 DIAGNOSIS — H04123 Dry eye syndrome of bilateral lacrimal glands: Secondary | ICD-10-CM | POA: Diagnosis not present

## 2021-02-19 DIAGNOSIS — H401112 Primary open-angle glaucoma, right eye, moderate stage: Secondary | ICD-10-CM | POA: Diagnosis not present

## 2021-02-19 DIAGNOSIS — H401123 Primary open-angle glaucoma, left eye, severe stage: Secondary | ICD-10-CM | POA: Diagnosis not present

## 2021-02-24 DIAGNOSIS — I10 Essential (primary) hypertension: Secondary | ICD-10-CM | POA: Diagnosis not present

## 2021-02-24 DIAGNOSIS — E1169 Type 2 diabetes mellitus with other specified complication: Secondary | ICD-10-CM | POA: Diagnosis not present

## 2021-02-24 DIAGNOSIS — R2689 Other abnormalities of gait and mobility: Secondary | ICD-10-CM | POA: Diagnosis not present

## 2021-02-24 DIAGNOSIS — R609 Edema, unspecified: Secondary | ICD-10-CM | POA: Diagnosis not present

## 2021-03-02 DIAGNOSIS — I10 Essential (primary) hypertension: Secondary | ICD-10-CM | POA: Diagnosis not present

## 2021-03-05 DIAGNOSIS — Z7951 Long term (current) use of inhaled steroids: Secondary | ICD-10-CM | POA: Diagnosis not present

## 2021-03-05 DIAGNOSIS — Z7984 Long term (current) use of oral hypoglycemic drugs: Secondary | ICD-10-CM | POA: Diagnosis not present

## 2021-03-05 DIAGNOSIS — Z9181 History of falling: Secondary | ICD-10-CM | POA: Diagnosis not present

## 2021-03-05 DIAGNOSIS — D649 Anemia, unspecified: Secondary | ICD-10-CM | POA: Diagnosis not present

## 2021-03-05 DIAGNOSIS — H409 Unspecified glaucoma: Secondary | ICD-10-CM | POA: Diagnosis not present

## 2021-03-05 DIAGNOSIS — J449 Chronic obstructive pulmonary disease, unspecified: Secondary | ICD-10-CM | POA: Diagnosis not present

## 2021-03-05 DIAGNOSIS — E119 Type 2 diabetes mellitus without complications: Secondary | ICD-10-CM | POA: Diagnosis not present

## 2021-03-05 DIAGNOSIS — M1990 Unspecified osteoarthritis, unspecified site: Secondary | ICD-10-CM | POA: Diagnosis not present

## 2021-03-05 DIAGNOSIS — I272 Pulmonary hypertension, unspecified: Secondary | ICD-10-CM | POA: Diagnosis not present

## 2021-03-05 DIAGNOSIS — Z9981 Dependence on supplemental oxygen: Secondary | ICD-10-CM | POA: Diagnosis not present

## 2021-03-05 DIAGNOSIS — I10 Essential (primary) hypertension: Secondary | ICD-10-CM | POA: Diagnosis not present

## 2021-03-05 DIAGNOSIS — J9611 Chronic respiratory failure with hypoxia: Secondary | ICD-10-CM | POA: Diagnosis not present

## 2021-03-11 DIAGNOSIS — M1990 Unspecified osteoarthritis, unspecified site: Secondary | ICD-10-CM | POA: Diagnosis not present

## 2021-03-11 DIAGNOSIS — D649 Anemia, unspecified: Secondary | ICD-10-CM | POA: Diagnosis not present

## 2021-03-11 DIAGNOSIS — H409 Unspecified glaucoma: Secondary | ICD-10-CM | POA: Diagnosis not present

## 2021-03-11 DIAGNOSIS — Z9181 History of falling: Secondary | ICD-10-CM | POA: Diagnosis not present

## 2021-03-11 DIAGNOSIS — I272 Pulmonary hypertension, unspecified: Secondary | ICD-10-CM | POA: Diagnosis not present

## 2021-03-11 DIAGNOSIS — J449 Chronic obstructive pulmonary disease, unspecified: Secondary | ICD-10-CM | POA: Diagnosis not present

## 2021-03-11 DIAGNOSIS — E119 Type 2 diabetes mellitus without complications: Secondary | ICD-10-CM | POA: Diagnosis not present

## 2021-03-11 DIAGNOSIS — Z7984 Long term (current) use of oral hypoglycemic drugs: Secondary | ICD-10-CM | POA: Diagnosis not present

## 2021-03-11 DIAGNOSIS — I10 Essential (primary) hypertension: Secondary | ICD-10-CM | POA: Diagnosis not present

## 2021-03-11 DIAGNOSIS — Z9981 Dependence on supplemental oxygen: Secondary | ICD-10-CM | POA: Diagnosis not present

## 2021-03-11 DIAGNOSIS — J9611 Chronic respiratory failure with hypoxia: Secondary | ICD-10-CM | POA: Diagnosis not present

## 2021-03-11 DIAGNOSIS — Z7951 Long term (current) use of inhaled steroids: Secondary | ICD-10-CM | POA: Diagnosis not present

## 2021-03-16 DIAGNOSIS — J449 Chronic obstructive pulmonary disease, unspecified: Secondary | ICD-10-CM | POA: Diagnosis not present

## 2021-03-17 DIAGNOSIS — M130 Polyarthritis, unspecified: Secondary | ICD-10-CM | POA: Diagnosis not present

## 2021-03-17 DIAGNOSIS — I10 Essential (primary) hypertension: Secondary | ICD-10-CM | POA: Diagnosis not present

## 2021-03-17 DIAGNOSIS — E1169 Type 2 diabetes mellitus with other specified complication: Secondary | ICD-10-CM | POA: Diagnosis not present

## 2021-03-18 DIAGNOSIS — J9611 Chronic respiratory failure with hypoxia: Secondary | ICD-10-CM | POA: Diagnosis not present

## 2021-03-18 DIAGNOSIS — D649 Anemia, unspecified: Secondary | ICD-10-CM | POA: Diagnosis not present

## 2021-03-18 DIAGNOSIS — H409 Unspecified glaucoma: Secondary | ICD-10-CM | POA: Diagnosis not present

## 2021-03-18 DIAGNOSIS — M1990 Unspecified osteoarthritis, unspecified site: Secondary | ICD-10-CM | POA: Diagnosis not present

## 2021-03-18 DIAGNOSIS — Z7984 Long term (current) use of oral hypoglycemic drugs: Secondary | ICD-10-CM | POA: Diagnosis not present

## 2021-03-18 DIAGNOSIS — E119 Type 2 diabetes mellitus without complications: Secondary | ICD-10-CM | POA: Diagnosis not present

## 2021-03-18 DIAGNOSIS — I272 Pulmonary hypertension, unspecified: Secondary | ICD-10-CM | POA: Diagnosis not present

## 2021-03-18 DIAGNOSIS — J449 Chronic obstructive pulmonary disease, unspecified: Secondary | ICD-10-CM | POA: Diagnosis not present

## 2021-03-18 DIAGNOSIS — Z7951 Long term (current) use of inhaled steroids: Secondary | ICD-10-CM | POA: Diagnosis not present

## 2021-03-18 DIAGNOSIS — Z9181 History of falling: Secondary | ICD-10-CM | POA: Diagnosis not present

## 2021-03-18 DIAGNOSIS — I10 Essential (primary) hypertension: Secondary | ICD-10-CM | POA: Diagnosis not present

## 2021-03-18 DIAGNOSIS — Z9981 Dependence on supplemental oxygen: Secondary | ICD-10-CM | POA: Diagnosis not present

## 2021-03-30 ENCOUNTER — Inpatient Hospital Stay (HOSPITAL_COMMUNITY)
Admission: EM | Admit: 2021-03-30 | Discharge: 2021-04-21 | DRG: 190 | Disposition: A | Discharge status: Home Health | Payer: Medicare Other | Attending: Family Medicine | Admitting: Family Medicine

## 2021-03-30 ENCOUNTER — Other Ambulatory Visit: Payer: Self-pay

## 2021-03-30 ENCOUNTER — Emergency Department (HOSPITAL_COMMUNITY): Payer: Medicare Other

## 2021-03-30 ENCOUNTER — Encounter (HOSPITAL_COMMUNITY): Payer: Self-pay

## 2021-03-30 DIAGNOSIS — R059 Cough, unspecified: Secondary | ICD-10-CM | POA: Diagnosis not present

## 2021-03-30 DIAGNOSIS — Z7951 Long term (current) use of inhaled steroids: Secondary | ICD-10-CM

## 2021-03-30 DIAGNOSIS — R1314 Dysphagia, pharyngoesophageal phase: Secondary | ICD-10-CM | POA: Diagnosis present

## 2021-03-30 DIAGNOSIS — Z7189 Other specified counseling: Secondary | ICD-10-CM | POA: Diagnosis not present

## 2021-03-30 DIAGNOSIS — I499 Cardiac arrhythmia, unspecified: Secondary | ICD-10-CM | POA: Diagnosis not present

## 2021-03-30 DIAGNOSIS — T434X5A Adverse effect of butyrophenone and thiothixene neuroleptics, initial encounter: Secondary | ICD-10-CM | POA: Diagnosis not present

## 2021-03-30 DIAGNOSIS — J449 Chronic obstructive pulmonary disease, unspecified: Secondary | ICD-10-CM | POA: Diagnosis present

## 2021-03-30 DIAGNOSIS — J441 Chronic obstructive pulmonary disease with (acute) exacerbation: Secondary | ICD-10-CM | POA: Diagnosis not present

## 2021-03-30 DIAGNOSIS — M25572 Pain in left ankle and joints of left foot: Secondary | ICD-10-CM | POA: Diagnosis not present

## 2021-03-30 DIAGNOSIS — I129 Hypertensive chronic kidney disease with stage 1 through stage 4 chronic kidney disease, or unspecified chronic kidney disease: Secondary | ICD-10-CM | POA: Diagnosis present

## 2021-03-30 DIAGNOSIS — G4489 Other headache syndrome: Secondary | ICD-10-CM | POA: Diagnosis not present

## 2021-03-30 DIAGNOSIS — N179 Acute kidney failure, unspecified: Secondary | ICD-10-CM | POA: Diagnosis not present

## 2021-03-30 DIAGNOSIS — R0603 Acute respiratory distress: Secondary | ICD-10-CM

## 2021-03-30 DIAGNOSIS — I714 Abdominal aortic aneurysm, without rupture: Secondary | ICD-10-CM | POA: Diagnosis present

## 2021-03-30 DIAGNOSIS — M546 Pain in thoracic spine: Secondary | ICD-10-CM | POA: Diagnosis not present

## 2021-03-30 DIAGNOSIS — R296 Repeated falls: Secondary | ICD-10-CM | POA: Diagnosis present

## 2021-03-30 DIAGNOSIS — E876 Hypokalemia: Secondary | ICD-10-CM | POA: Diagnosis not present

## 2021-03-30 DIAGNOSIS — J9611 Chronic respiratory failure with hypoxia: Secondary | ICD-10-CM | POA: Diagnosis not present

## 2021-03-30 DIAGNOSIS — I272 Pulmonary hypertension, unspecified: Secondary | ICD-10-CM | POA: Diagnosis present

## 2021-03-30 DIAGNOSIS — J321 Chronic frontal sinusitis: Secondary | ICD-10-CM | POA: Diagnosis not present

## 2021-03-30 DIAGNOSIS — E1165 Type 2 diabetes mellitus with hyperglycemia: Secondary | ICD-10-CM | POA: Diagnosis present

## 2021-03-30 DIAGNOSIS — E1122 Type 2 diabetes mellitus with diabetic chronic kidney disease: Secondary | ICD-10-CM | POA: Diagnosis not present

## 2021-03-30 DIAGNOSIS — J9621 Acute and chronic respiratory failure with hypoxia: Secondary | ICD-10-CM | POA: Diagnosis present

## 2021-03-30 DIAGNOSIS — J9811 Atelectasis: Secondary | ICD-10-CM | POA: Diagnosis not present

## 2021-03-30 DIAGNOSIS — J90 Pleural effusion, not elsewhere classified: Secondary | ICD-10-CM | POA: Diagnosis not present

## 2021-03-30 DIAGNOSIS — R5381 Other malaise: Secondary | ICD-10-CM | POA: Diagnosis not present

## 2021-03-30 DIAGNOSIS — E872 Acidosis: Secondary | ICD-10-CM | POA: Diagnosis not present

## 2021-03-30 DIAGNOSIS — M1990 Unspecified osteoarthritis, unspecified site: Secondary | ICD-10-CM | POA: Diagnosis not present

## 2021-03-30 DIAGNOSIS — S22080A Wedge compression fracture of T11-T12 vertebra, initial encounter for closed fracture: Secondary | ICD-10-CM

## 2021-03-30 DIAGNOSIS — Z0389 Encounter for observation for other suspected diseases and conditions ruled out: Secondary | ICD-10-CM | POA: Diagnosis not present

## 2021-03-30 DIAGNOSIS — E1169 Type 2 diabetes mellitus with other specified complication: Secondary | ICD-10-CM | POA: Diagnosis not present

## 2021-03-30 DIAGNOSIS — R1319 Other dysphagia: Secondary | ICD-10-CM | POA: Diagnosis not present

## 2021-03-30 DIAGNOSIS — D649 Anemia, unspecified: Secondary | ICD-10-CM | POA: Diagnosis not present

## 2021-03-30 DIAGNOSIS — Z20822 Contact with and (suspected) exposure to covid-19: Secondary | ICD-10-CM | POA: Diagnosis present

## 2021-03-30 DIAGNOSIS — Z8249 Family history of ischemic heart disease and other diseases of the circulatory system: Secondary | ICD-10-CM

## 2021-03-30 DIAGNOSIS — Z7984 Long term (current) use of oral hypoglycemic drugs: Secondary | ICD-10-CM

## 2021-03-30 DIAGNOSIS — M1712 Unilateral primary osteoarthritis, left knee: Secondary | ICD-10-CM | POA: Diagnosis not present

## 2021-03-30 DIAGNOSIS — R531 Weakness: Secondary | ICD-10-CM | POA: Diagnosis not present

## 2021-03-30 DIAGNOSIS — M4854XS Collapsed vertebra, not elsewhere classified, thoracic region, sequela of fracture: Secondary | ICD-10-CM | POA: Diagnosis present

## 2021-03-30 DIAGNOSIS — J9622 Acute and chronic respiratory failure with hypercapnia: Secondary | ICD-10-CM | POA: Diagnosis present

## 2021-03-30 DIAGNOSIS — E1136 Type 2 diabetes mellitus with diabetic cataract: Secondary | ICD-10-CM | POA: Diagnosis not present

## 2021-03-30 DIAGNOSIS — I110 Hypertensive heart disease with heart failure: Secondary | ICD-10-CM | POA: Diagnosis not present

## 2021-03-30 DIAGNOSIS — E871 Hypo-osmolality and hyponatremia: Secondary | ICD-10-CM | POA: Diagnosis not present

## 2021-03-30 DIAGNOSIS — R404 Transient alteration of awareness: Secondary | ICD-10-CM | POA: Diagnosis not present

## 2021-03-30 DIAGNOSIS — R6889 Other general symptoms and signs: Secondary | ICD-10-CM | POA: Diagnosis not present

## 2021-03-30 DIAGNOSIS — Z87891 Personal history of nicotine dependence: Secondary | ICD-10-CM

## 2021-03-30 DIAGNOSIS — M25562 Pain in left knee: Secondary | ICD-10-CM | POA: Diagnosis present

## 2021-03-30 DIAGNOSIS — D631 Anemia in chronic kidney disease: Secondary | ICD-10-CM | POA: Diagnosis not present

## 2021-03-30 DIAGNOSIS — A528 Late syphilis, latent: Secondary | ICD-10-CM | POA: Diagnosis present

## 2021-03-30 DIAGNOSIS — E785 Hyperlipidemia, unspecified: Secondary | ICD-10-CM | POA: Diagnosis present

## 2021-03-30 DIAGNOSIS — Z9181 History of falling: Secondary | ICD-10-CM | POA: Diagnosis not present

## 2021-03-30 DIAGNOSIS — Z79899 Other long term (current) drug therapy: Secondary | ICD-10-CM

## 2021-03-30 DIAGNOSIS — R269 Unspecified abnormalities of gait and mobility: Secondary | ICD-10-CM | POA: Diagnosis not present

## 2021-03-30 DIAGNOSIS — J9610 Chronic respiratory failure, unspecified whether with hypoxia or hypercapnia: Secondary | ICD-10-CM | POA: Diagnosis not present

## 2021-03-30 DIAGNOSIS — M1711 Unilateral primary osteoarthritis, right knee: Secondary | ICD-10-CM | POA: Diagnosis not present

## 2021-03-30 DIAGNOSIS — R519 Headache, unspecified: Secondary | ICD-10-CM | POA: Diagnosis not present

## 2021-03-30 DIAGNOSIS — I719 Aortic aneurysm of unspecified site, without rupture: Secondary | ICD-10-CM

## 2021-03-30 DIAGNOSIS — R0602 Shortness of breath: Secondary | ICD-10-CM | POA: Diagnosis not present

## 2021-03-30 DIAGNOSIS — I639 Cerebral infarction, unspecified: Secondary | ICD-10-CM | POA: Diagnosis not present

## 2021-03-30 DIAGNOSIS — M542 Cervicalgia: Secondary | ICD-10-CM | POA: Diagnosis not present

## 2021-03-30 DIAGNOSIS — I10 Essential (primary) hypertension: Secondary | ICD-10-CM | POA: Diagnosis not present

## 2021-03-30 DIAGNOSIS — G9341 Metabolic encephalopathy: Secondary | ICD-10-CM | POA: Diagnosis present

## 2021-03-30 DIAGNOSIS — F0390 Unspecified dementia without behavioral disturbance: Secondary | ICD-10-CM | POA: Diagnosis present

## 2021-03-30 DIAGNOSIS — I509 Heart failure, unspecified: Secondary | ICD-10-CM | POA: Diagnosis not present

## 2021-03-30 DIAGNOSIS — M7989 Other specified soft tissue disorders: Secondary | ICD-10-CM | POA: Diagnosis not present

## 2021-03-30 DIAGNOSIS — M25569 Pain in unspecified knee: Secondary | ICD-10-CM

## 2021-03-30 DIAGNOSIS — M25561 Pain in right knee: Secondary | ICD-10-CM | POA: Diagnosis present

## 2021-03-30 DIAGNOSIS — F05 Delirium due to known physiological condition: Secondary | ICD-10-CM | POA: Diagnosis not present

## 2021-03-30 DIAGNOSIS — R0989 Other specified symptoms and signs involving the circulatory and respiratory systems: Secondary | ICD-10-CM

## 2021-03-30 DIAGNOSIS — Z9981 Dependence on supplemental oxygen: Secondary | ICD-10-CM

## 2021-03-30 DIAGNOSIS — M545 Low back pain, unspecified: Secondary | ICD-10-CM | POA: Diagnosis not present

## 2021-03-30 DIAGNOSIS — Q283 Other malformations of cerebral vessels: Secondary | ICD-10-CM | POA: Diagnosis not present

## 2021-03-30 DIAGNOSIS — Z743 Need for continuous supervision: Secondary | ICD-10-CM | POA: Diagnosis not present

## 2021-03-30 DIAGNOSIS — N1831 Chronic kidney disease, stage 3a: Secondary | ICD-10-CM | POA: Diagnosis not present

## 2021-03-30 LAB — COMPREHENSIVE METABOLIC PANEL
ALT: 11 U/L (ref 0–44)
AST: 21 U/L (ref 15–41)
Albumin: 2.7 g/dL — ABNORMAL LOW (ref 3.5–5.0)
Alkaline Phosphatase: 47 U/L (ref 38–126)
Anion gap: 10 (ref 5–15)
BUN: 18 mg/dL (ref 8–23)
CO2: 33 mmol/L — ABNORMAL HIGH (ref 22–32)
Calcium: 8.8 mg/dL — ABNORMAL LOW (ref 8.9–10.3)
Chloride: 92 mmol/L — ABNORMAL LOW (ref 98–111)
Creatinine, Ser: 1.22 mg/dL — ABNORMAL HIGH (ref 0.44–1.00)
GFR, Estimated: 42 mL/min — ABNORMAL LOW (ref >60)
Glucose, Bld: 105 mg/dL — ABNORMAL HIGH (ref 70–99)
Potassium: 4.4 mmol/L (ref 3.5–5.1)
Sodium: 135 mmol/L (ref 135–145)
Total Bilirubin: 0.6 mg/dL (ref 0.3–1.2)
Total Protein: 6.8 g/dL (ref 6.5–8.1)

## 2021-03-30 LAB — CBC
HCT: 31.1 % — ABNORMAL LOW (ref 36.0–46.0)
Hemoglobin: 9.4 g/dL — ABNORMAL LOW (ref 12.0–15.0)
MCH: 27.4 pg (ref 26.0–34.0)
MCHC: 30.2 g/dL (ref 30.0–36.0)
MCV: 90.7 fL (ref 80.0–100.0)
Platelets: 301 10*3/uL (ref 150–400)
RBC: 3.43 MIL/uL — ABNORMAL LOW (ref 3.87–5.11)
RDW: 14 % (ref 11.5–15.5)
WBC: 9 10*3/uL (ref 4.0–10.5)
nRBC: 0 % (ref 0.0–0.2)

## 2021-03-30 LAB — APTT: aPTT: 31 seconds (ref 24–36)

## 2021-03-30 LAB — CBC WITH DIFFERENTIAL/PLATELET
Abs Immature Granulocytes: 0.04 10*3/uL (ref 0.00–0.07)
Basophils Absolute: 0 10*3/uL (ref 0.0–0.1)
Basophils Relative: 0 %
Eosinophils Absolute: 0.2 10*3/uL (ref 0.0–0.5)
Eosinophils Relative: 2 %
HCT: 31.2 % — ABNORMAL LOW (ref 36.0–46.0)
Hemoglobin: 9.4 g/dL — ABNORMAL LOW (ref 12.0–15.0)
Immature Granulocytes: 1 %
Lymphocytes Relative: 20 %
Lymphs Abs: 1.8 10*3/uL (ref 0.7–4.0)
MCH: 27.2 pg (ref 26.0–34.0)
MCHC: 30.1 g/dL (ref 30.0–36.0)
MCV: 90.2 fL (ref 80.0–100.0)
Monocytes Absolute: 1.1 10*3/uL — ABNORMAL HIGH (ref 0.1–1.0)
Monocytes Relative: 12 %
Neutro Abs: 5.7 10*3/uL (ref 1.7–7.7)
Neutrophils Relative %: 65 %
Platelets: 302 10*3/uL (ref 150–400)
RBC: 3.46 MIL/uL — ABNORMAL LOW (ref 3.87–5.11)
RDW: 14 % (ref 11.5–15.5)
WBC: 8.8 10*3/uL (ref 4.0–10.5)
nRBC: 0 % (ref 0.0–0.2)

## 2021-03-30 LAB — RESP PANEL BY RT-PCR (FLU A&B, COVID) ARPGX2
Influenza A by PCR: NEGATIVE
Influenza B by PCR: NEGATIVE
SARS Coronavirus 2 by RT PCR: NEGATIVE

## 2021-03-30 LAB — PROTIME-INR
INR: 1.1 (ref 0.8–1.2)
Prothrombin Time: 14.3 seconds (ref 11.4–15.2)

## 2021-03-30 LAB — CBG MONITORING, ED: Glucose-Capillary: 96 mg/dL (ref 70–99)

## 2021-03-30 MED ORDER — SODIUM CHLORIDE 0.9 % IV SOLN
100.0000 mL/h | INTRAVENOUS | Status: DC
  Administered 2021-03-30 – 2021-04-03 (×4): 100 mL/h via INTRAVENOUS

## 2021-03-30 MED ORDER — ALBUTEROL SULFATE (2.5 MG/3ML) 0.083% IN NEBU: 5.0000 mg | INHALATION_SOLUTION | RESPIRATORY_TRACT | Status: DC | PRN

## 2021-03-30 MED ORDER — SODIUM CHLORIDE 0.9 % IV BOLUS
500.0000 mL | Freq: Once | INTRAVENOUS | Status: AC
  Administered 2021-03-30: 500 mL via INTRAVENOUS

## 2021-03-30 MED ORDER — IPRATROPIUM BROMIDE 0.02 % IN SOLN
0.5000 mg | Freq: Once | RESPIRATORY_TRACT | Status: AC
  Administered 2021-03-30: 0.5 mg via RESPIRATORY_TRACT
  Filled 2021-03-30: qty 2.5

## 2021-03-30 MED ORDER — PREDNISONE 20 MG PO TABS
40.0000 mg | ORAL_TABLET | Freq: Once | ORAL | Status: AC
  Administered 2021-03-30: 40 mg via ORAL
  Filled 2021-03-30: qty 2

## 2021-03-30 NOTE — ED Triage Notes (Signed)
Patient arrives with [facility] from home, fell on Friday and hit her head, L ankle/foot pain, not on thinners, altered mental status that started yesterday, denies hx of dementia, walks with a walker at baseline.

## 2021-03-30 NOTE — ED Provider Notes (Signed)
[facility] EMERGENCY DEPARTMENT Provider Note   CSN: [id number] Arrival date & time: 03/30/21  1953     History Chief Complaint  Patient presents with  . Fall  . Altered Mental Status    Sheri [name] is a 87 y.o. female.  HPI   Patient arrives from home.  Patient had a fall on Friday when she hit her head.  She also injured her left foot and ankle.  Patient was able to get up and walk around but starting yesterday she seemed to have more episodes of confusion.  Patient did not recognize one of her daughters.  She was having some hallucinations.  Family brought her in for evaluation today.  No fevers.  No chest pain or abdominal pain.  No vomiting or diarrhea.  Patient denies having any headache right now.  She does have some pain in her neck and her back.  Past Medical History:  Diagnosis Date  . Bronchitis   . COPD (chronic obstructive pulmonary disease) (HCC)   . Hypertension     Patient Active Problem List   Diagnosis Date Noted  . Pulmonary hypertension (HCC) 04/18/2019  . Chronic respiratory failure with hypoxia (HCC) 03/09/2018  . Gait difficulty 02/06/2018  . Chronic obstructive pulmonary disease (HCC) 01/04/2015  . Former tobacco use 01/04/2015  . Essential hypertension 01/04/2015  . Acute renal failure (HCC) 01/04/2015  . Anemia 01/04/2015    Past Surgical History:  Procedure Laterality Date  . EYE SURGERY    . TUBAL LIGATION       OB History   No obstetric history on file.     Family History  Problem Relation Age of Onset  . Breast cancer Other   . Heart attack Mother   . Heart attack Father     Social History   Tobacco Use  . Smoking status: Former Smoker    Packs/day: 0.50    Types: Cigarettes    Quit date: 01/03/2002    Years since quitting: 19.2  . Smokeless tobacco: Never Used  . Tobacco comment: teenager  Vaping Use  . Vaping Use: Never used  Substance Use Topics  . Alcohol use: No  . Drug use: No    Home  Medications Prior to Admission medications   Medication Sig Start Date End Date Taking? Authorizing Provider  BREZTRI AEROSPHERE 160-9-4.8 MCG/ACT AERO  04/01/20   [provider]  COMBIVENT RESPIMAT 20-100 MCG/ACT AERS respimat Inhale 1 puff into the lungs 4 (four) times daily. 09/26/20   [provider]  diclofenac sodium (VOLTAREN) 1 % GEL Apply 2 g topically 2 (two) times daily as needed (joint pain).  03/25/19   [provider]  diclofenac Sodium (VOLTAREN) 1 % GEL Apply 4 g topically 4 (four) times daily. 10/28/20   Melene Plan, DO  ferrous sulfate 325 (65 FE) MG tablet Take 325 mg by mouth 2 (two) times daily with a meal.  01/23/19   [provider]  gabapentin (NEURONTIN) 100 MG capsule 2 (two) times a day. 02/08/19   [provider]  hydrochlorothiazide (MICROZIDE) 12.5 MG capsule TAKE 1 CAPSULE BY MOUTH EVERY DAY 12/17/20   Patwardhan, Manish J, MD  JANUVIA 100 MG tablet Take 100 mg by mouth daily.  02/20/19   [provider]  metoprolol succinate (TOPROL-XL) 50 MG 24 hr tablet TAKE 1 TABLET (50 MG TOTAL) BY MOUTH DAILY. TAKE WITH OR IMMEDIATELY FOLLOWING A MEAL. 10/15/20 01/13/21  Patwardhan, Anabel Bene, MD  mirtazapine (  REMERON) 7.5 MG tablet Take 7.5 mg by mouth at bedtime.  01/16/18   [provider]  OXYGEN Inhale 3 L into the lungs continuous.    [provider]  pantoprazole (PROTONIX) 40 MG tablet Take 40 mg by mouth daily.    [provider]  rosuvastatin (CRESTOR) 20 MG tablet Take 20 mg by mouth at bedtime. 07/20/20   [provider]  SYMBICORT 160-4.5 MCG/ACT inhaler Inhale 2 puffs into the lungs 2 (two) times daily.  01/30/18   [provider]  Tiotropium Bromide Monohydrate (SPIRIVA RESPIMAT) 1.25 MCG/ACT AERS Inhale 2 puffs into the lungs daily. 02/06/18   Oretha Milch, MD  triamcinolone (KENALOG) 0.025 % ointment  02/18/20   [provider]  verapamil (CALAN-SR) 240 MG CR tablet  TAKE 1 TABLET BY MOUTH AT BEDTIME. 01/22/21   Patwardhan, Manish J, MD  Vitamin D, Ergocalciferol, (DRISDOL) 1.25 MG (50000 UT) CAPS capsule Take 50,000 Units by mouth every 7 (seven) days. Monday 02/13/19   [provider]    Allergies    Arlice Colt isothiocyanate]  Review of Systems   Review of Systems  All other systems reviewed and are negative.   Physical Exam Updated Vital Signs BP (!) 163/76 (BP Location: Right Arm)   Pulse 75   Temp 98.7 F (37.1 C) (Oral)   Resp (!) 23   Ht 1.575 m (5\' 2" )   Wt 64.4 kg   SpO2 99%   BMI 25.97 kg/m   Physical Exam Vitals and nursing note reviewed.  Constitutional:      Appearance: She is well-developed.     Comments: Elderly, frail  HENT:     Head: Normocephalic and atraumatic.     Right Ear: External ear normal.     Left Ear: External ear normal.  Eyes:     General: No scleral icterus.       Right eye: No discharge.        Left eye: No discharge.     Conjunctiva/sclera: Conjunctivae normal.  Neck:     Trachea: No tracheal deviation.  Cardiovascular:     Rate and Rhythm: Normal rate and regular rhythm.  Pulmonary:     Effort: Pulmonary effort is normal. No respiratory distress.     Breath sounds: Normal breath sounds. No stridor. No wheezing or rales.  Abdominal:     General: Bowel sounds are normal. There is no distension.     Palpations: Abdomen is soft.     Tenderness: There is no abdominal tenderness. There is no guarding or rebound.  Musculoskeletal:        General: Tenderness present.     Cervical back: Neck supple.     Comments: Mild tenderness palpation left foot, mild tenderness cervical thoracic and lumbar spine, no step-off  Skin:    General: Skin is warm and dry.     Findings: No rash.  Neurological:     General: No focal deficit present.     Mental Status: She is alert.     Cranial Nerves: No cranial nerve deficit (no facial droop, extraocular movements intact, no slurred speech).      Sensory: No sensory deficit.     Motor: Weakness present. No abnormal muscle tone or seizure activity.     Coordination: Coordination normal.     Comments: Patient is oriented to person and place, she does not know the year, able to lift both arms and legs off the bed although more difficulty moving her  right leg     ED Results / Procedures / Treatments   Labs (all labs ordered are listed, but only abnormal results are displayed) Labs Reviewed  RESP PANEL BY RT-PCR (FLU A&B, COVID) ARPGX2  COMPREHENSIVE METABOLIC PANEL  CBC  ETHANOL  PROTIME-INR  APTT  DIFFERENTIAL  RAPID URINE DRUG SCREEN, HOSP PERFORMED  URINALYSIS, ROUTINE W REFLEX MICROSCOPIC  CBG MONITORING, ED  CBG MONITORING, ED    EKG EKG Interpretation  Date/Time:  Monday Mar 30 2021 20:01:08 EDT Ventricular Rate:  75 PR Interval:  137 QRS Duration: 82 QT Interval:  377 QTC Calculation: 421 R Axis:   83 Text Interpretation: Ectopic atrial rhythm Borderline right axis deviation Left ventricular hypertrophy Abnrm T, consider ischemia, anterolateral lds No significant change since last tracing Confirmed by Linwood Dibbles [phone]) on 03/30/2021 8:17:43 PM   Radiology No results found.  Procedures Procedures   Medications Ordered in ED Medications  sodium chloride 0.9 % bolus 500 mL (has no administration in time range)    Followed by  0.9 %  sodium chloride infusion (has no administration in time range)    ED Course  I have reviewed the triage vital signs and the nursing notes.  Pertinent labs & imaging results that were available during my care of the patient were reviewed by me and considered in my medical decision making (see chart for details).  Clinical Course as of 03/31/21 1509  Mon Mar 30, 2021  2135 Hemoglobin is slightly decreased from previous value of 10.4 [JK]  2256 Metabolic panel shows stable renal insufficiency [JK]  2257 T11 spin spine fracture noted on x-rays.  This does appear slightly  increased from [JK]  2301 Wheezng on exam now.  Labored breathing.  Will start nebs [JK]    Clinical Course User Index [JK] Linwood Dibbles, MD   MDM Rules/Calculators/A&P                          Pt initially presented with recent fall.  Some confusion.  Initially denied any resp sx.  ED workup without signs of definite new injuries.   T11 fx had been noted previously.  Pt started to have wheezing shortness of breath.  Breathing treatment ordered, abg added on to assess for hypercarbia.  Care turned over to [facility] Final Clinical Impression(s) / ED Diagnoses Final diagnoses:  Closed wedge compression fracture of T11 vertebra, initial encounter (HCC)  Aortic aneurysm without rupture, unspecified portion of aorta (HCC)  Acute on chronic respiratory failure with hypercapnia [facility])    Rx / DC Orders ED Discharge Orders    None       Linwood Dibbles, MD 03/31/21 1511

## 2021-03-31 ENCOUNTER — Observation Stay (HOSPITAL_COMMUNITY): Payer: Medicare Other

## 2021-03-31 DIAGNOSIS — I272 Pulmonary hypertension, unspecified: Secondary | ICD-10-CM | POA: Diagnosis present

## 2021-03-31 DIAGNOSIS — J9622 Acute and chronic respiratory failure with hypercapnia: Secondary | ICD-10-CM | POA: Diagnosis present

## 2021-03-31 DIAGNOSIS — J449 Chronic obstructive pulmonary disease, unspecified: Secondary | ICD-10-CM | POA: Diagnosis not present

## 2021-03-31 DIAGNOSIS — J441 Chronic obstructive pulmonary disease with (acute) exacerbation: Secondary | ICD-10-CM | POA: Diagnosis present

## 2021-03-31 DIAGNOSIS — N179 Acute kidney failure, unspecified: Secondary | ICD-10-CM | POA: Diagnosis present

## 2021-03-31 DIAGNOSIS — E1169 Type 2 diabetes mellitus with other specified complication: Secondary | ICD-10-CM | POA: Diagnosis not present

## 2021-03-31 DIAGNOSIS — I10 Essential (primary) hypertension: Secondary | ICD-10-CM | POA: Diagnosis not present

## 2021-03-31 DIAGNOSIS — T434X5A Adverse effect of butyrophenone and thiothixene neuroleptics, initial encounter: Secondary | ICD-10-CM | POA: Diagnosis not present

## 2021-03-31 DIAGNOSIS — E872 Acidosis: Secondary | ICD-10-CM | POA: Diagnosis present

## 2021-03-31 DIAGNOSIS — R296 Repeated falls: Secondary | ICD-10-CM | POA: Diagnosis present

## 2021-03-31 DIAGNOSIS — I509 Heart failure, unspecified: Secondary | ICD-10-CM | POA: Diagnosis not present

## 2021-03-31 DIAGNOSIS — A528 Late syphilis, latent: Secondary | ICD-10-CM | POA: Diagnosis present

## 2021-03-31 DIAGNOSIS — D631 Anemia in chronic kidney disease: Secondary | ICD-10-CM | POA: Diagnosis present

## 2021-03-31 DIAGNOSIS — S22080A Wedge compression fracture of T11-T12 vertebra, initial encounter for closed fracture: Secondary | ICD-10-CM | POA: Diagnosis not present

## 2021-03-31 DIAGNOSIS — I714 Abdominal aortic aneurysm, without rupture: Secondary | ICD-10-CM | POA: Diagnosis present

## 2021-03-31 DIAGNOSIS — E1136 Type 2 diabetes mellitus with diabetic cataract: Secondary | ICD-10-CM | POA: Diagnosis present

## 2021-03-31 DIAGNOSIS — Z20822 Contact with and (suspected) exposure to covid-19: Secondary | ICD-10-CM | POA: Diagnosis present

## 2021-03-31 DIAGNOSIS — F0390 Unspecified dementia without behavioral disturbance: Secondary | ICD-10-CM | POA: Diagnosis present

## 2021-03-31 DIAGNOSIS — E1122 Type 2 diabetes mellitus with diabetic chronic kidney disease: Secondary | ICD-10-CM | POA: Diagnosis present

## 2021-03-31 DIAGNOSIS — I719 Aortic aneurysm of unspecified site, without rupture: Secondary | ICD-10-CM

## 2021-03-31 DIAGNOSIS — Z7189 Other specified counseling: Secondary | ICD-10-CM | POA: Diagnosis not present

## 2021-03-31 DIAGNOSIS — E1165 Type 2 diabetes mellitus with hyperglycemia: Secondary | ICD-10-CM | POA: Diagnosis present

## 2021-03-31 DIAGNOSIS — E876 Hypokalemia: Secondary | ICD-10-CM | POA: Diagnosis present

## 2021-03-31 DIAGNOSIS — G9341 Metabolic encephalopathy: Secondary | ICD-10-CM | POA: Diagnosis present

## 2021-03-31 DIAGNOSIS — N1831 Chronic kidney disease, stage 3a: Secondary | ICD-10-CM | POA: Diagnosis present

## 2021-03-31 DIAGNOSIS — I129 Hypertensive chronic kidney disease with stage 1 through stage 4 chronic kidney disease, or unspecified chronic kidney disease: Secondary | ICD-10-CM | POA: Diagnosis present

## 2021-03-31 DIAGNOSIS — E871 Hypo-osmolality and hyponatremia: Secondary | ICD-10-CM | POA: Diagnosis present

## 2021-03-31 DIAGNOSIS — I110 Hypertensive heart disease with heart failure: Secondary | ICD-10-CM | POA: Diagnosis not present

## 2021-03-31 DIAGNOSIS — D649 Anemia, unspecified: Secondary | ICD-10-CM | POA: Diagnosis not present

## 2021-03-31 DIAGNOSIS — J9621 Acute and chronic respiratory failure with hypoxia: Secondary | ICD-10-CM | POA: Diagnosis present

## 2021-03-31 DIAGNOSIS — F05 Delirium due to known physiological condition: Secondary | ICD-10-CM | POA: Diagnosis not present

## 2021-03-31 DIAGNOSIS — J9611 Chronic respiratory failure with hypoxia: Secondary | ICD-10-CM

## 2021-03-31 LAB — CBC
HCT: 31.7 % — ABNORMAL LOW (ref 36.0–46.0)
Hemoglobin: 9.4 g/dL — ABNORMAL LOW (ref 12.0–15.0)
MCH: 27.1 pg (ref 26.0–34.0)
MCHC: 29.7 g/dL — ABNORMAL LOW (ref 30.0–36.0)
MCV: 91.4 fL (ref 80.0–100.0)
Platelets: 144 10*3/uL — ABNORMAL LOW (ref 150–400)
RBC: 3.47 MIL/uL — ABNORMAL LOW (ref 3.87–5.11)
RDW: 14 % (ref 11.5–15.5)
WBC: 5.9 10*3/uL (ref 4.0–10.5)
nRBC: 0 % (ref 0.0–0.2)

## 2021-03-31 LAB — RAPID URINE DRUG SCREEN, HOSP PERFORMED
Amphetamines: NOT DETECTED
Barbiturates: NOT DETECTED
Benzodiazepines: NOT DETECTED
Cocaine: NOT DETECTED
Opiates: NOT DETECTED
Tetrahydrocannabinol: NOT DETECTED

## 2021-03-31 LAB — I-STAT ARTERIAL BLOOD GAS, ED
Acid-Base Excess: 8 mmol/L — ABNORMAL HIGH (ref 0.0–2.0)
Bicarbonate: 35.5 mmol/L — ABNORMAL HIGH (ref 20.0–28.0)
Calcium, Ion: 1.19 mmol/L (ref 1.15–1.40)
HCT: 29 % — ABNORMAL LOW (ref 36.0–46.0)
Hemoglobin: 9.9 g/dL — ABNORMAL LOW (ref 12.0–15.0)
O2 Saturation: 86 %
Patient temperature: 98.7
Potassium: 4.3 mmol/L (ref 3.5–5.1)
Sodium: 136 mmol/L (ref 135–145)
TCO2: 38 mmol/L — ABNORMAL HIGH (ref 22–32)
pCO2 arterial: 69.2 mmHg (ref 32.0–48.0)
pH, Arterial: 7.318 — ABNORMAL LOW (ref 7.350–7.450)
pO2, Arterial: 59 mmHg — ABNORMAL LOW (ref 83.0–108.0)

## 2021-03-31 LAB — BASIC METABOLIC PANEL
Anion gap: 12 (ref 5–15)
BUN: 19 mg/dL (ref 8–23)
CO2: 34 mmol/L — ABNORMAL HIGH (ref 22–32)
Calcium: 8.7 mg/dL — ABNORMAL LOW (ref 8.9–10.3)
Chloride: 90 mmol/L — ABNORMAL LOW (ref 98–111)
Creatinine, Ser: 1.26 mg/dL — ABNORMAL HIGH (ref 0.44–1.00)
GFR, Estimated: 40 mL/min — ABNORMAL LOW (ref >60)
Glucose, Bld: 147 mg/dL — ABNORMAL HIGH (ref 70–99)
Potassium: 4.6 mmol/L (ref 3.5–5.1)
Sodium: 136 mmol/L (ref 135–145)

## 2021-03-31 LAB — URINALYSIS, ROUTINE W REFLEX MICROSCOPIC
Bacteria, UA: NONE SEEN
Bilirubin Urine: NEGATIVE
Glucose, UA: NEGATIVE mg/dL
Ketones, ur: NEGATIVE mg/dL
Leukocytes,Ua: NEGATIVE
Nitrite: NEGATIVE
Protein, ur: NEGATIVE mg/dL
Specific Gravity, Urine: 1.016 (ref 1.005–1.030)
pH: 5 (ref 5.0–8.0)

## 2021-03-31 LAB — I-STAT VENOUS BLOOD GAS, ED
Acid-Base Excess: 10 mmol/L — ABNORMAL HIGH (ref 0.0–2.0)
Bicarbonate: 39.4 mmol/L — ABNORMAL HIGH (ref 20.0–28.0)
Calcium, Ion: 1.2 mmol/L (ref 1.15–1.40)
HCT: 32 % — ABNORMAL LOW (ref 36.0–46.0)
Hemoglobin: 10.9 g/dL — ABNORMAL LOW (ref 12.0–15.0)
O2 Saturation: 27 %
Potassium: 4.3 mmol/L (ref 3.5–5.1)
Sodium: 138 mmol/L (ref 135–145)
TCO2: 42 mmol/L — ABNORMAL HIGH (ref 22–32)
pCO2, Ven: 87.9 mmHg (ref 44.0–60.0)
pH, Ven: 7.26 (ref 7.250–7.430)
pO2, Ven: 22 mmHg — CL (ref 32.0–45.0)

## 2021-03-31 LAB — HIV ANTIBODY (ROUTINE TESTING W REFLEX): HIV Screen 4th Generation wRfx: NONREACTIVE

## 2021-03-31 MED ORDER — ALBUTEROL SULFATE (2.5 MG/3ML) 0.083% IN NEBU
2.5000 mg | INHALATION_SOLUTION | RESPIRATORY_TRACT | Status: DC | PRN
  Administered 2021-04-06 (×2): 2.5 mg via RESPIRATORY_TRACT
  Filled 2021-03-31 (×2): qty 3

## 2021-03-31 MED ORDER — HYDROCHLOROTHIAZIDE 12.5 MG PO CAPS
12.5000 mg | ORAL_CAPSULE | Freq: Every day | ORAL | Status: DC
  Administered 2021-03-31 – 2021-04-17 (×15): 12.5 mg via ORAL
  Filled 2021-03-31 (×17): qty 1

## 2021-03-31 MED ORDER — LINAGLIPTIN 5 MG PO TABS
5.0000 mg | ORAL_TABLET | Freq: Every day | ORAL | Status: DC
  Administered 2021-04-01 – 2021-04-17 (×13): 5 mg via ORAL
  Filled 2021-03-31 (×16): qty 1

## 2021-03-31 MED ORDER — TIOTROPIUM BROMIDE MONOHYDRATE 1.25 MCG/ACT IN AERS: 2.0000 | INHALATION_SPRAY | Freq: Every day | RESPIRATORY_TRACT | Status: DC

## 2021-03-31 MED ORDER — IPRATROPIUM-ALBUTEROL 0.5-2.5 (3) MG/3ML IN SOLN
3.0000 mL | Freq: Four times a day (QID) | RESPIRATORY_TRACT | Status: DC
  Administered 2021-03-31: 3 mL via RESPIRATORY_TRACT
  Filled 2021-03-31: qty 3

## 2021-03-31 MED ORDER — ENOXAPARIN SODIUM 30 MG/0.3ML IJ SOSY
30.0000 mg | PREFILLED_SYRINGE | INTRAMUSCULAR | Status: DC
  Administered 2021-03-31 – 2021-04-02 (×3): 30 mg via SUBCUTANEOUS
  Filled 2021-03-31 (×3): qty 0.3

## 2021-03-31 MED ORDER — SODIUM CHLORIDE 0.9 % IV SOLN
1.0000 g | INTRAVENOUS | Status: DC
  Administered 2021-04-01 – 2021-04-04 (×4): 1 g via INTRAVENOUS
  Filled 2021-03-31 (×5): qty 10

## 2021-03-31 MED ORDER — METOPROLOL SUCCINATE ER 50 MG PO TB24
50.0000 mg | ORAL_TABLET | Freq: Every day | ORAL | Status: DC
  Administered 2021-03-31 – 2021-04-21 (×19): 50 mg via ORAL
  Filled 2021-03-31 (×8): qty 1
  Filled 2021-03-31: qty 2
  Filled 2021-03-31 (×12): qty 1

## 2021-03-31 MED ORDER — LORAZEPAM 2 MG/ML IJ SOLN
0.5000 mg | Freq: Once | INTRAMUSCULAR | Status: AC | PRN
  Administered 2021-03-31: 0.5 mg via INTRAVENOUS
  Filled 2021-03-31: qty 1

## 2021-03-31 MED ORDER — ALBUTEROL (5 MG/ML) CONTINUOUS INHALATION SOLN
10.0000 mg/h | INHALATION_SOLUTION | RESPIRATORY_TRACT | Status: DC
  Administered 2021-03-31: 10 mg/h via RESPIRATORY_TRACT
  Filled 2021-03-31: qty 20

## 2021-03-31 MED ORDER — PANTOPRAZOLE SODIUM 40 MG PO TBEC
40.0000 mg | DELAYED_RELEASE_TABLET | Freq: Every day | ORAL | Status: DC
  Administered 2021-03-31 – 2021-04-21 (×19): 40 mg via ORAL
  Filled 2021-03-31 (×21): qty 1

## 2021-03-31 MED ORDER — MIRTAZAPINE 15 MG PO TABS
7.5000 mg | ORAL_TABLET | Freq: Every day | ORAL | Status: DC
  Administered 2021-03-31 – 2021-04-20 (×20): 7.5 mg via ORAL
  Filled 2021-03-31 (×21): qty 1

## 2021-03-31 MED ORDER — ROSUVASTATIN CALCIUM 20 MG PO TABS
20.0000 mg | ORAL_TABLET | Freq: Every day | ORAL | Status: DC
  Administered 2021-03-31 – 2021-04-20 (×20): 20 mg via ORAL
  Filled 2021-03-31 (×20): qty 1

## 2021-03-31 MED ORDER — DIAZEPAM 5 MG/ML IJ SOLN
2.5000 mg | Freq: Once | INTRAMUSCULAR | Status: AC
  Administered 2021-03-31: 2.5 mg via INTRAVENOUS
  Filled 2021-03-31: qty 2

## 2021-03-31 MED ORDER — METHYLPREDNISOLONE SODIUM SUCC 125 MG IJ SOLR: 60.0000 mg | Freq: Two times a day (BID) | INTRAMUSCULAR | Status: DC

## 2021-03-31 MED ORDER — MOMETASONE FURO-FORMOTEROL FUM 200-5 MCG/ACT IN AERO: 2.0000 | INHALATION_SPRAY | Freq: Two times a day (BID) | RESPIRATORY_TRACT | Status: DC

## 2021-03-31 MED ORDER — FLUTICASONE FUROATE-VILANTEROL 100-25 MCG/INH IN AEPB
1.0000 | INHALATION_SPRAY | Freq: Every day | RESPIRATORY_TRACT | Status: DC
  Administered 2021-04-01 – 2021-04-21 (×14): 1 via RESPIRATORY_TRACT
  Filled 2021-03-31 (×2): qty 28

## 2021-03-31 MED ORDER — SODIUM CHLORIDE 0.9 % IV SOLN
1.0000 g | Freq: Once | INTRAVENOUS | Status: AC
  Administered 2021-03-31: 1 g via INTRAVENOUS
  Filled 2021-03-31: qty 10

## 2021-03-31 MED ORDER — HYDRALAZINE HCL 25 MG PO TABS
25.0000 mg | ORAL_TABLET | Freq: Four times a day (QID) | ORAL | Status: DC | PRN
  Administered 2021-03-31 – 2021-04-04 (×2): 25 mg via ORAL
  Filled 2021-03-31 (×3): qty 1

## 2021-03-31 MED ORDER — PREDNISONE 20 MG PO TABS
40.0000 mg | ORAL_TABLET | Freq: Every day | ORAL | Status: DC
  Administered 2021-03-31 – 2021-04-02 (×2): 40 mg via ORAL
  Filled 2021-03-31 (×2): qty 2

## 2021-03-31 MED ORDER — UMECLIDINIUM BROMIDE 62.5 MCG/INH IN AEPB
1.0000 | INHALATION_SPRAY | Freq: Every day | RESPIRATORY_TRACT | Status: DC
  Administered 2021-04-01 – 2021-04-21 (×14): 1 via RESPIRATORY_TRACT
  Filled 2021-03-31 (×2): qty 7

## 2021-03-31 MED ORDER — AMLODIPINE BESYLATE 5 MG PO TABS
5.0000 mg | ORAL_TABLET | Freq: Every day | ORAL | Status: DC
  Administered 2021-03-31 – 2021-04-02 (×3): 5 mg via ORAL
  Filled 2021-03-31 (×3): qty 1

## 2021-03-31 MED ORDER — VERAPAMIL HCL ER 240 MG PO TBCR
240.0000 mg | EXTENDED_RELEASE_TABLET | Freq: Every day | ORAL | Status: DC
  Administered 2021-03-31 – 2021-04-20 (×19): 240 mg via ORAL
  Filled 2021-03-31 (×22): qty 1

## 2021-03-31 MED ORDER — LATANOPROST 0.005 % OP SOLN
1.0000 [drp] | Freq: Every day | OPHTHALMIC | Status: DC
  Administered 2021-03-31 – 2021-04-20 (×20): 1 [drp] via OPHTHALMIC
  Filled 2021-03-31: qty 2.5

## 2021-03-31 MED ORDER — FERROUS SULFATE 325 (65 FE) MG PO TABS
325.0000 mg | ORAL_TABLET | Freq: Every day | ORAL | Status: DC
  Administered 2021-03-31 – 2021-04-21 (×18): 325 mg via ORAL
  Filled 2021-03-31 (×20): qty 1

## 2021-03-31 MED ORDER — GABAPENTIN 100 MG PO CAPS
100.0000 mg | ORAL_CAPSULE | Freq: Two times a day (BID) | ORAL | Status: DC
  Administered 2021-03-31 – 2021-04-01 (×2): 100 mg via ORAL
  Filled 2021-03-31 (×2): qty 1

## 2021-03-31 MED ORDER — BUDESON-GLYCOPYRROL-FORMOTEROL 160-9-4.8 MCG/ACT IN AERO: 1.0000 | INHALATION_SPRAY | Freq: Every day | RESPIRATORY_TRACT | Status: DC

## 2021-03-31 NOTE — ED Notes (Signed)
Patient transported to MRI 

## 2021-03-31 NOTE — Progress Notes (Signed)
Pt still with AMS despite bipap.  Repeat ABG ordered.  MRI brain ordered.

## 2021-03-31 NOTE — ED Notes (Addendum)
MRI informed that patient's daughter would need to answer safety questions as patient continues to be altered and unable to answer. MRI reports they will attempt to contact daughter.

## 2021-03-31 NOTE — H&P (Addendum)
History and Physical    Sheri Pulsehelma H Eisler ZOX:[id number] DOB: 10/14/1926 DOA: 03/30/2021  PCP: Renaye RakersBland, Veita, MD  Patient coming from: Home  I have personally briefly [facility]  Chief Complaint: AMS  HPI: Sheri [name] is a 87 y.o. female with medical history significant of COPD on 3L home O2 at baseline, HTN.  Pt presents to ED for c/o AMS for past 2 days following a fall 4 days ago.  Following the fall on Friday where she hit her head, as well as injuring L foot and ankle: she was initially amble to ambulate.  However, starting [place] she began to have increasing confusion.  Didn't recognize one of her daughters, and had some hallucinations.  This is markedly different from baseline, at baseline she is AAOx3.  No CP, no abd pain, no fevers, some pain in neck and back.  Though she tells me "none right now".   ED Course: Imaging studies: 1) T11 compression fx that she had in Dec now increased height loss to 50%. 2) No other acute abnormality noted on imaging studies. 3) 4.3cm AAA, f/u in 12 months 4) Heterogeneity of the thyroid gland. In the setting of significant comorbidities or limited life expectancy, no follow-up recommended  UA neg.  CT head neg  VBG does show borderline respiratory acidosis with metabolic compensation suggesting a mild acute on severe chronic hypercapnic failure.  PH 7.26, PCO2 87.9, bicarb 39.4   Review of Systems: As per HPI, otherwise all review of systems negative.  Past Medical History:  Diagnosis Date  . Bronchitis   . COPD (chronic obstructive pulmonary disease) (HCC)   . Hypertension     Past Surgical History:  Procedure Laterality Date  . EYE SURGERY    . TUBAL LIGATION       reports that she quit smoking about 19 years ago. Her smoking use included cigarettes. She smoked 0.50 packs per day. She has never used smokeless tobacco. She reports that she does not drink alcohol and does not use  drugs.  Allergies  Allergen Reactions  . Arlice ColtMustard [Allyl Isothiocyanate] Hives    Family History  Problem Relation Age of Onset  . Breast cancer Other   . Heart attack Mother   . Heart attack Father      Prior to Admission medications   Medication Sig Start Date End Date Taking? Authorizing Provider  amLODipine (NORVASC) 5 MG tablet Take 5 mg by mouth daily.   Yes [provider]  BREZTRI AEROSPHERE 160-9-4.8 MCG/ACT AERO Inhale 1 puff into the lungs at bedtime. 04/01/20  Yes [provider]  COMBIVENT RESPIMAT 20-100 MCG/ACT AERS respimat Inhale 1 puff into the lungs 4 (four) times daily. 09/26/20  Yes [provider]  ferrous sulfate 325 (65 FE) MG tablet Take 325 mg by mouth daily with breakfast. 01/23/19  Yes [provider]  gabapentin (NEURONTIN) 100 MG capsule Take 100 mg by mouth 2 (two) times a day. 02/08/19  Yes [provider]  hydrochlorothiazide (MICROZIDE) 12.5 MG capsule TAKE 1 CAPSULE BY MOUTH EVERY DAY Patient taking differently: Take 12.5 mg by mouth daily. 12/17/20  Yes Patwardhan, Manish J, MD  JANUVIA 100 MG tablet Take 100 mg by mouth daily.  02/20/19  Yes [provider]  latanoprost (XALATAN) 0.005 % ophthalmic solution Place 1 drop into the right eye at bedtime. 02/19/21  Yes [provider]  metoprolol succinate (TOPROL-XL) 50 MG 24 hr tablet TAKE 1  TABLET (50 MG TOTAL) BY MOUTH DAILY. TAKE WITH OR IMMEDIATELY FOLLOWING A MEAL. Patient taking differently: Take 50 mg by mouth daily. 10/15/20 01/13/21 Yes Patwardhan, Manish J, MD  mirtazapine (REMERON) 7.5 MG tablet Take 7.5 mg by mouth at bedtime.  01/16/18  Yes [provider]  OXYGEN Inhale 3 L into the lungs continuous.   Yes [provider]  pantoprazole (PROTONIX) 40 MG tablet Take 40 mg by mouth daily.   Yes [provider]  rosuvastatin (CRESTOR) 20 MG tablet Take 20 mg by mouth at bedtime. 07/20/20  Yes [provider]  SYMBICORT 160-4.5 MCG/ACT inhaler Inhale 2 puffs into the lungs 2 (two) times daily.  01/30/18  Yes [provider]  verapamil (CALAN-SR) 240 MG CR tablet TAKE 1 TABLET BY MOUTH AT BEDTIME. Patient taking differently: Take 240 mg by mouth at bedtime. 01/22/21  Yes Patwardhan, Anabel Bene, MD  Vitamin D, Ergocalciferol, (DRISDOL) 1.25 MG (50000 UT) CAPS capsule Take 50,000 Units by mouth every Monday. 02/13/19  Yes [provider]  diclofenac Sodium (VOLTAREN) 1 % GEL Apply 4 g topically 4 (four) times daily. Patient not taking: Reported on 03/31/2021 10/28/20   Melene Plan, DO  Tiotropium Bromide Monohydrate (SPIRIVA RESPIMAT) 1.25 MCG/ACT AERS Inhale 2 puffs into the lungs daily. Patient not taking: Reported on 03/31/2021 02/06/18   Oretha Milch, MD    Physical Exam: Vitals:   03/31/21 0206 03/31/21 0245 03/31/21 0300 03/31/21 0330  BP:   (!) 144/76 (!) 145/66  Pulse: 75 81 82 90  Resp: (!) 27 19 18  (!) 22  Temp:      TempSrc:      SpO2:  100% 100% 100%  Weight:      Height:        Constitutional: NAD, calm, comfortable Eyes: PERRL, lids and conjunctivae normal ENMT: Mucous membranes are moist. Posterior pharynx clear of any exudate or lesions.Normal dentition.  Neck: normal, supple, no masses, no thyromegaly Respiratory: Wheezing present Cardiovascular: Regular rate and rhythm, no murmurs / rubs / gallops. No extremity edema. 2+ pedal pulses. No carotid bruits.  Abdomen: no tenderness, no masses palpated. No hepatosplenomegaly. Bowel sounds positive.  Musculoskeletal: no clubbing / cyanosis. No joint deformity upper and lower extremities. Good ROM, no contractures. Normal muscle tone.  Skin: no rashes, lesions, ulcers. No induration Neurologic: CN 2-12 grossly intact. Sensation intact, DTR normal. Strength 5/5 in all 4.  Psychiatric: Pt awake, oriented to self, location, and trying to take her BIPAP off.   Labs on Admission: I have personally reviewed following labs  and imaging studies  CBC: Recent Labs  Lab 03/30/21 2015 03/30/21 2123 03/31/21 0035  WBC 9.0 8.8  --   NEUTROABS  --  5.7  --   HGB 9.4* 9.4* 10.9*  HCT 31.1* 31.2* 32.0*  MCV 90.7 90.2  --   PLT 301 302  --    Basic Metabolic Panel: Recent Labs  Lab 03/30/21 2015 03/31/21 0035  NA 135 138  K 4.4 4.3  CL 92*  --   CO2 33*  --   GLUCOSE 105*  --   BUN 18  --   CREATININE 1.22*  --   CALCIUM 8.8*  --    GFR: Estimated Creatinine Clearance: 25.9 mL/min (A) (by C-G formula based on SCr of 1.22 mg/dL (H)). Liver Function Tests: Recent Labs  Lab 03/30/21 2015  AST 21  ALT 11  ALKPHOS 47  BILITOT 0.6  PROT 6.8  ALBUMIN  2.7*   No results for input(s): LIPASE, AMYLASE in the last 168 hours. No results for input(s): AMMONIA in the last 168 hours. Coagulation Profile: Recent Labs  Lab 03/30/21 2018  INR 1.1   Cardiac Enzymes: No results for input(s): CKTOTAL, CKMB, CKMBINDEX, TROPONINI in the last 168 hours. BNP (last 3 results) No results for input(s): PROBNP in the last 8760 hours. HbA1C: No results for input(s): HGBA1C in the last 72 hours. CBG: Recent Labs  Lab 03/30/21 2010  GLUCAP 96   Lipid Profile: No results for input(s): CHOL, HDL, LDLCALC, TRIG, CHOLHDL, LDLDIRECT in the last 72 hours. Thyroid Function Tests: No results for input(s): TSH, T4TOTAL, FREET4, T3FREE, THYROIDAB in the last 72 hours. Anemia Panel: No results for input(s): VITAMINB12, FOLATE, FERRITIN, TIBC, IRON, RETICCTPCT in the last 72 hours. Urine analysis:    Component Value Date/Time   COLORURINE YELLOW 03/30/2021 2350   APPEARANCEUR CLEAR 03/30/2021 2350   LABSPEC 1.016 03/30/2021 2350   PHURINE 5.0 03/30/2021 2350   GLUCOSEU NEGATIVE 03/30/2021 2350   HGBUR SMALL (A) 03/30/2021 2350   BILIRUBINUR NEGATIVE 03/30/2021 2350   KETONESUR NEGATIVE 03/30/2021 2350   PROTEINUR NEGATIVE 03/30/2021 2350   NITRITE NEGATIVE 03/30/2021 2350   LEUKOCYTESUR NEGATIVE 03/30/2021 2350     Radiological Exams on Admission: DG Thoracic Spine 2 View  Result Date: 03/30/2021 CLINICAL DATA:  Recent fall several days ago with back pain, initial encounter EXAM: THORACIC SPINE 2 VIEWS COMPARISON:  CT from 10/28/2020 FINDINGS: T11 compression fracture is noted with some slight increased height loss when compared with the prior CT. Mild osteophytic changes are seen. No paraspinal mass lesion is noted. No pedicle abnormality is seen. IMPRESSION: T11 compression fracture with some increase in vertebral body height loss when compared with the prior study now at approximately 50% vertebral body height anteriorly. Electronically Signed   By: Alcide Clever M.D.   On: 03/30/2021 21:46   DG Lumbar Spine Complete  Result Date: 03/30/2021 CLINICAL DATA:  Low back pain following fall several days ago, initial encounter EXAM: LUMBAR SPINE - COMPLETE 4+ VIEW COMPARISON:  10/28/2020 CT FINDINGS: Five lumbar type vertebral bodies are well visualized. No pars defects are noted. No anterolisthesis is noted. Multilevel osteophytic changes are seen. T11 compression fracture is again noted. Disc space narrowing at L4-5 and to a lesser degree at L5-S1 is seen. Facet hypertrophic changes are noted. Abdominal aortic calcifications are seen with mild dilatation to approximately 4.3 cm. IMPRESSION: Multilevel degenerative change without acute abnormality. Dilatation of the infrarenal aorta to 4.3 cm. Recommend follow-up every 12 months and vascular consultation. This recommendation follows ACR consensus guidelines: White Paper of the ACR Incidental Findings Committee II on Vascular Findings. J Am Coll Radiol 2013; 10:789-794. Electronically Signed   By: Alcide Clever M.D.   On: 03/30/2021 21:47   DG Ankle 2 Views Left  Result Date: 03/30/2021 CLINICAL DATA:  Left ankle pain following fall several days ago, initial encounter EXAM: LEFT ANKLE - 2 VIEW COMPARISON:  None. FINDINGS: No acute fracture or dislocation is noted.  Mild soft tissue swelling is seen. IMPRESSION: Soft tissue swelling without acute bony abnormality. Electronically Signed   By: Alcide Clever M.D.   On: 03/30/2021 21:44   CT HEAD WO CONTRAST  Result Date: 03/30/2021 CLINICAL DATA:  Recent fall 3 days ago with headaches and neck pain, initial encounter EXAM: CT HEAD WITHOUT CONTRAST CT CERVICAL SPINE WITHOUT CONTRAST TECHNIQUE: Multidetector CT imaging of the head and cervical spine was  performed following the standard protocol without intravenous contrast. Multiplanar CT image reconstructions of the cervical spine were also generated. COMPARISON:  10/28/2020 FINDINGS: CT HEAD FINDINGS Brain: No evidence of acute infarction, hemorrhage, hydrocephalus, extra-axial collection or mass lesion/mass effect. Mild atrophic changes and chronic white matter ischemic changes are noted. Vascular: No hyperdense vessel or unexpected calcification. Skull: Stable sclerotic focus is noted in the left frontal bone similar to that seen on prior exams. Sinuses/Orbits: No acute finding. Other: Small left frontal scalp lipoma is noted. CT CERVICAL SPINE FINDINGS Alignment: Within normal limits. Skull base and vertebrae: 7 cervical segments are well visualized. Vertebral body height is well maintained. Multilevel osteophytic changes are seen. Mild facet hypertrophic changes are noted. No acute fracture or acute facet abnormality is noted. Soft tissues and spinal canal: Soft tissue calcifications are noted in the region of the submandibular gland on the right consistent with some sialoliths. Largest of these measures 11 mm. Heterogeneity of the thyroid is noted. No discrete nodule is noted. Upper chest: Lung apices demonstrate emphysematous change. Other: None IMPRESSION: CT of the head: Chronic atrophic and ischemic changes without acute abnormality. CT of the cervical spine: Multilevel degenerative change without acute bony abnormality. Right submandibular sialoliths. Heterogeneity of  the thyroid gland. In the setting of significant comorbidities or limited life expectancy, no follow-up recommended (ref: J Am Coll Radiol. 2015 Feb;12(2): 143-50). Electronically Signed   By: Alcide Clever M.D.   On: 03/30/2021 21:38   CT CERVICAL SPINE WO CONTRAST  Result Date: 03/30/2021 CLINICAL DATA:  Recent fall 3 days ago with headaches and neck pain, initial encounter EXAM: CT HEAD WITHOUT CONTRAST CT CERVICAL SPINE WITHOUT CONTRAST TECHNIQUE: Multidetector CT imaging of the head and cervical spine was performed following the standard protocol without intravenous contrast. Multiplanar CT image reconstructions of the cervical spine were also generated. COMPARISON:  10/28/2020 FINDINGS: CT HEAD FINDINGS Brain: No evidence of acute infarction, hemorrhage, hydrocephalus, extra-axial collection or mass lesion/mass effect. Mild atrophic changes and chronic white matter ischemic changes are noted. Vascular: No hyperdense vessel or unexpected calcification. Skull: Stable sclerotic focus is noted in the left frontal bone similar to that seen on prior exams. Sinuses/Orbits: No acute finding. Other: Small left frontal scalp lipoma is noted. CT CERVICAL SPINE FINDINGS Alignment: Within normal limits. Skull base and vertebrae: 7 cervical segments are well visualized. Vertebral body height is well maintained. Multilevel osteophytic changes are seen. Mild facet hypertrophic changes are noted. No acute fracture or acute facet abnormality is noted. Soft tissues and spinal canal: Soft tissue calcifications are noted in the region of the submandibular gland on the right consistent with some sialoliths. Largest of these measures 11 mm. Heterogeneity of the thyroid is noted. No discrete nodule is noted. Upper chest: Lung apices demonstrate emphysematous change. Other: None IMPRESSION: CT of the head: Chronic atrophic and ischemic changes without acute abnormality. CT of the cervical spine: Multilevel degenerative change  without acute bony abnormality. Right submandibular sialoliths. Heterogeneity of the thyroid gland. In the setting of significant comorbidities or limited life expectancy, no follow-up recommended (ref: J Am Coll Radiol. 2015 Feb;12(2): 143-50). Electronically Signed   By: Alcide Clever M.D.   On: 03/30/2021 21:38   DG Chest Portable 1 View  Result Date: 03/30/2021 CLINICAL DATA:  Weakness EXAM: PORTABLE CHEST 1 VIEW COMPARISON:  02/11/2020 FINDINGS: Cardiac shadow is mildly enlarged but stable. Aortic calcifications are noted. Aorta is tortuous but stable. The lungs are clear bilaterally. No bony abnormality is seen. No  bony abnormality is noted. IMPRESSION: No active disease. Electronically Signed   By: Alcide Clever M.D.   On: 03/30/2021 21:40   DG Foot Complete Left  Result Date: 03/30/2021 CLINICAL DATA:  Recent fall several days ago with ankle pain and foot pain, initial encounter EXAM: LEFT FOOT - COMPLETE 3+ VIEW COMPARISON:  06/15/16 FINDINGS: No acute fracture or dislocation is noted. No soft tissue abnormality is seen. Vascular calcifications are noted. IMPRESSION: No acute abnormality noted. Electronically Signed   By: Alcide Clever M.D.   On: 03/30/2021 21:39    EKG: Independently reviewed.  Assessment/Plan Principal Problem:   Acute metabolic encephalopathy Active Problems:   Chronic obstructive pulmonary disease (HCC)   Essential hypertension   Chronic respiratory failure with hypoxia (HCC)   Acute on chronic respiratory failure with hypercapnia (HCC)    1. Acute metabolic encephalopathy - 1. Possibly secondary to CO2 retention from COPD exacerbation 2. BIPAP 3. Treat COPD exacerbation 4. If not rapidly improving, get MRI brain in AM 2. COPD exacerbation - 1. COPD pathway 2. Cont pulse ox 3. Tele monitor 4. BIPAP as above for the moment - though suspect she may remove it shortly. 5. Systemic steroids 6. Inh steroids, scheduled LABA, LAMA 7. PRN SABA 8. Empiric  rocephin 3. HTN - 1. Cont home BP meds 4. Compression fx of T11 - 1. Pt denying pain at the moment, not clear if acute worsening or just worse since Dec. 2. Re-evaluate when mental status improved 3. If painful, then may be candidate for vertebroplasty / kyphoplasty. 5. AAA - 1. Follow up imaging in 12 months. 2. Needs referral / consult to vascular surgery   DVT prophylaxis: Lovenox Code Status: Full Family Communication: No family in room Disposition Plan: Home after mental status improved Consults called: None Admission status: Place in 57   Johneric Mcfadden M. DO [facility]  How to contact the [facility] Attending or Consulting provider 7A - 7P or covering provider during after hours 7P -7A, for this patient?  1. Check the care team in [facility] and look for a) attending/consulting [facility] provider listed and b) the [facility] team listed 2. Log into [email or web address]  Amion Physician Scheduling and messaging for groups and whole hospitals  On call and physician scheduling software for group practices, residents, hospitalists and other medical providers for call, clinic, rotation and shift schedules. On[employer] is a hospital-wide system for scheduling doctors and paging doctors on call. EasyPlot is for scientific plotting and data analysis.  [email or web address]  and use [place]'s universal password to access. If you do not have the password, please contact the hospital operator.  3. Locate the [facility] provider you are looking for under [facility] and page to a number that you can be directly reached. 4. If you still have difficulty reaching the provider, please page the [facility] (Director on Call) for the Hospitalists listed on amion for assistance.  03/31/2021, 3:57 AM

## 2021-03-31 NOTE — ED Provider Notes (Addendum)
I assumed care of this patient.  Please see previous provider note for further details of Hx, PE.  Briefly patient is a 87 y.o. female who presented AMS for 2 days s/p fall 4 days ago.  CT head and cervical spine without any acute injuries. Plain films of her left lower extremity negative. Patient has a known T11 compression fracture was slightly worsening height loss. Labs at her baseline and grossly reassuring.  Patient has a history of COPD currently wheezing, satting well on her home 3 L nasal cannula. She was provided with a breathing treatment and steroids.  Currently awaiting UA to determine if she is got a UTI contributing to her altered mental status. VBG added to assess for hypercarbia.  VBG notable for hypercarbia.  pH in the lower limits of normal.  This may be a contributing factor.  We will place patient on BiPAP and admit to the hospital.  If her mental status does not improve, MRI may be warranted to assess for any frontal CVA.  Case discussed with hospitalist who will admit patient for further work-up and management.   Marland Kitchen1-3 Lead EKG Interpretation Performed by: Nira Conn, MD Authorized by: Nira Conn, MD     Interpretation: normal     ECG rate:  87   ECG rate assessment: normal     Rhythm: sinus rhythm     Ectopy: none     Conduction: normal   .Critical Care Performed by: Nira Conn, MD Authorized by: Nira Conn, MD   Critical care provider statement:    Critical care time (minutes):  45   Critical care was necessary to treat or prevent imminent or life-threatening deterioration of the following conditions:  Respiratory failure   Critical care was time spent personally by me on the following activities:  Discussions with consultants, evaluation of patient's response to treatment, examination of patient, ordering and performing treatments and interventions, ordering and review of laboratory studies, ordering and  review of radiographic studies, pulse oximetry, re-evaluation of patient's condition, obtaining history from patient or surrogate and review of old charts        Derisha Funderburke, Amadeo Garnet, MD 03/31/21 [phone]

## 2021-03-31 NOTE — Progress Notes (Signed)
NEW ADMISSION NOTE New Admission Note:   Arrival Method: stretcher Mental Orientation: A&O X1 Telemetry: [id number] Assessment: Completed Skin: intact. Bruising bilateral arms, abrasion left leg IV: LFA  Pain: 0/10 Tubes: none Safety Measures: Safety Fall Prevention Plan has been given, discussed and signed Admission: Completed [facility] [place] Orientation: Patient has been orientated to the room, unit and staff.  Family: none at bedside  Orders have been reviewed and implemented. Will continue to monitor the patient. Call light has been placed within reach and bed alarm has been activated.   Lonell Stamos S [place] Kretz, RN

## 2021-03-31 NOTE — ED Notes (Signed)
Pt not cooperative in MRI. Staff called asking medication be given to patient for agitation. Waiting orders from attending.

## 2021-03-31 NOTE — Progress Notes (Signed)
RT NOTE:  ABG collected. CO2 69.2, Po2 59. RT switched patient back to BIPAP.

## 2021-03-31 NOTE — Progress Notes (Signed)
Subjective: Patient admitted this morning, see detailed H&P by Dr Julian Reil 87 year old female with history of COPD on 3 L home O2 at baseline, hypertension presented with altered mental status for past 2 days following a fall 4 days ago.  In the ED imaging study showed T11 compression fracture that she had in December no increased height loss to 50%, no other acute abnormalities noted on imaging studies.  4.3 cm AAA follow-up in 12 months.  Heterogeneity of the thyroid gland in the setting of significant comorbidities or limited life expectancy no follow-up recommended.  CT head was negative.  UA was negative.  VBG showed borderline respiratory acidosis with metabolic compensation suggesting mild acute on severe chronic hypercapnic failure.  Vitals:   03/31/21 1230 03/31/21 1300  BP: (!) 180/76 (!) 191/78  Pulse:    Resp: 17 (!) 23  Temp:    SpO2:        A/P  Acute on chronic hypercapnic failure -patient was started on BiPAP, now she is off BiPAP and is currently satting well on 3 L/min oxygen.  COPD exacerbation -Started on duo nebs, prednisone, Breo, Incruse -Started on Rocephin   Hypertension -Continue home medications -Add hydralazine as needed for BP greater than 160/100   AAA -4.3 cm AAA noted, recommended follow-up every 12 months as outpatient   T11 compression fracture -Present since December 2021 -Now some increasing vertebral body height loss when compared to prior study by approximately 50% vertebral body height anteriorly -Consider kyphoplasty if patient pain is not controlled    Meredeth Ide [facility] [phone]

## 2021-03-31 NOTE — Progress Notes (Signed)
PT Cancellation Note  Patient Details Name: Sheri [name] MRN: [id number] DOB: 10-Jun-1926   Cancelled Treatment:    Reason Eval/Treat Not Completed: Patient at procedure or test/unavailable Pt currently at MRI. Will follow up as schedule allows.   Cindee Salt, DPT  Acute Rehabilitation Services  Pager: [phone] Office: [phone]    [name] 03/31/2021, 9:51 AM

## 2021-03-31 NOTE — ED Notes (Signed)
Pts IV infiltrated, attempted to restart x3, unsucessful. IV team consult placed.

## 2021-03-31 NOTE — Progress Notes (Signed)
RT NOTE:  Pt continues to pull off BIPAP mask. MD at bedside. Pt switched to 5L [place]. Will repeat ABG.

## 2021-03-31 NOTE — ED Notes (Signed)
Lama MD notified blood remains elevated after administration of daily meds. Waiting response

## 2021-03-31 NOTE — ED Notes (Signed)
Patient continues to take BiPAP off, placed back on 5L [place], patient wear 3L [place] at baseline.

## 2021-03-31 NOTE — ED Notes (Signed)
Patient continues to try to pull off bipap mask, patient educated on importance of keeping it on

## 2021-04-01 ENCOUNTER — Inpatient Hospital Stay (HOSPITAL_COMMUNITY): Payer: Medicare Other

## 2021-04-01 DIAGNOSIS — J441 Chronic obstructive pulmonary disease with (acute) exacerbation: Principal | ICD-10-CM

## 2021-04-01 DIAGNOSIS — R5381 Other malaise: Secondary | ICD-10-CM

## 2021-04-01 DIAGNOSIS — I10 Essential (primary) hypertension: Secondary | ICD-10-CM | POA: Diagnosis not present

## 2021-04-01 DIAGNOSIS — D649 Anemia, unspecified: Secondary | ICD-10-CM

## 2021-04-01 DIAGNOSIS — Z7189 Other specified counseling: Secondary | ICD-10-CM

## 2021-04-01 DIAGNOSIS — N1831 Chronic kidney disease, stage 3a: Secondary | ICD-10-CM

## 2021-04-01 LAB — COMPREHENSIVE METABOLIC PANEL
ALT: 13 U/L (ref 0–44)
AST: 26 U/L (ref 15–41)
Albumin: 2.5 g/dL — ABNORMAL LOW (ref 3.5–5.0)
Alkaline Phosphatase: 40 U/L (ref 38–126)
Anion gap: 11 (ref 5–15)
BUN: 17 mg/dL (ref 8–23)
CO2: 34 mmol/L — ABNORMAL HIGH (ref 22–32)
Calcium: 9 mg/dL (ref 8.9–10.3)
Chloride: 94 mmol/L — ABNORMAL LOW (ref 98–111)
Creatinine, Ser: 1.1 mg/dL — ABNORMAL HIGH (ref 0.44–1.00)
GFR, Estimated: 47 mL/min — ABNORMAL LOW (ref >60)
Glucose, Bld: 124 mg/dL — ABNORMAL HIGH (ref 70–99)
Potassium: 4.1 mmol/L (ref 3.5–5.1)
Sodium: 139 mmol/L (ref 135–145)
Total Bilirubin: 0.5 mg/dL (ref 0.3–1.2)
Total Protein: 6.3 g/dL — ABNORMAL LOW (ref 6.5–8.1)

## 2021-04-01 LAB — CBC
HCT: 28.3 % — ABNORMAL LOW (ref 36.0–46.0)
Hemoglobin: 8.8 g/dL — ABNORMAL LOW (ref 12.0–15.0)
MCH: 27.4 pg (ref 26.0–34.0)
MCHC: 31.1 g/dL (ref 30.0–36.0)
MCV: 88.2 fL (ref 80.0–100.0)
Platelets: 327 10*3/uL (ref 150–400)
RBC: 3.21 MIL/uL — ABNORMAL LOW (ref 3.87–5.11)
RDW: 14.1 % (ref 11.5–15.5)
WBC: 10.2 10*3/uL (ref 4.0–10.5)
nRBC: 0 % (ref 0.0–0.2)

## 2021-04-01 LAB — GLUCOSE, CAPILLARY: Glucose-Capillary: 128 mg/dL — ABNORMAL HIGH (ref 70–99)

## 2021-04-01 LAB — IRON AND TIBC
Iron: 48 ug/dL (ref 28–170)
Saturation Ratios: 23 % (ref 10.4–31.8)
TIBC: 204 ug/dL — ABNORMAL LOW (ref 250–450)
UIBC: 156 ug/dL

## 2021-04-01 LAB — RETICULOCYTES
Immature Retic Fract: 17.3 % — ABNORMAL HIGH (ref 2.3–15.9)
RBC.: 3.42 MIL/uL — ABNORMAL LOW (ref 3.87–5.11)
Retic Count, Absolute: 38.3 10*3/uL (ref 19.0–186.0)
Retic Ct Pct: 1.1 % (ref 0.4–3.1)

## 2021-04-01 LAB — FOLATE: Folate: 8.7 ng/mL (ref >5.9)

## 2021-04-01 LAB — FERRITIN: Ferritin: 94 ng/mL (ref 11–307)

## 2021-04-01 LAB — TSH: TSH: 2.085 u[IU]/mL (ref 0.350–4.500)

## 2021-04-01 LAB — AMMONIA: Ammonia: 47 umol/L — ABNORMAL HIGH (ref 9–35)

## 2021-04-01 LAB — VITAMIN B12: Vitamin B-12: 301 pg/mL (ref 180–914)

## 2021-04-01 MED ORDER — ADULT MULTIVITAMIN W/MINERALS CH
1.0000 | ORAL_TABLET | Freq: Every day | ORAL | Status: DC
  Administered 2021-04-01 – 2021-04-21 (×18): 1 via ORAL
  Filled 2021-04-01 (×19): qty 1

## 2021-04-01 MED ORDER — GUAIFENESIN-DM 100-10 MG/5ML PO SYRP
5.0000 mL | ORAL_SOLUTION | Freq: Three times a day (TID) | ORAL | Status: DC | PRN
  Administered 2021-04-13 – 2021-04-16 (×2): 5 mL via ORAL
  Filled 2021-04-01 (×2): qty 5

## 2021-04-01 MED ORDER — ENSURE ENLIVE PO LIQD
237.0000 mL | Freq: Three times a day (TID) | ORAL | Status: DC
  Administered 2021-04-01 – 2021-04-04 (×10): 237 mL via ORAL

## 2021-04-01 MED ORDER — ORAL CARE MOUTH RINSE
15.0000 mL | Freq: Two times a day (BID) | OROMUCOSAL | Status: DC
  Administered 2021-04-01 – 2021-04-16 (×25): 15 mL via OROMUCOSAL

## 2021-04-01 MED ORDER — LORAZEPAM 2 MG/ML IJ SOLN
1.0000 mg | Freq: Once | INTRAMUSCULAR | Status: AC | PRN
  Administered 2021-04-01: 1 mg via INTRAVENOUS
  Filled 2021-04-01: qty 1

## 2021-04-01 MED ORDER — GUAIFENESIN ER 600 MG PO TB12
600.0000 mg | ORAL_TABLET | Freq: Two times a day (BID) | ORAL | Status: DC
  Administered 2021-04-02 – 2021-04-15 (×22): 600 mg via ORAL
  Filled 2021-04-01 (×28): qty 1

## 2021-04-01 NOTE — Progress Notes (Addendum)
Initial Nutrition Assessment  DOCUMENTATION CODES:   Not applicable  INTERVENTION:    Ensure Enlive po TID, each supplement provides 350 kcal and 20 grams of protein.  Recommend liberalize diet to regular.  MVI with minerals daily.  Obtain current weight.  NUTRITION DIAGNOSIS:   Increased nutrient needs related to chronic illness (COPD) as evidenced by estimated needs.  GOAL:   Patient will meet greater than or equal to 90% of their needs  MONITOR:   PO intake,Supplement acceptance  REASON FOR ASSESSMENT:   Consult COPD Protocol  ASSESSMENT:   87 yo female admitted with AMS, COPD exacerbation. PMH includes COPD on 3 L oxygen at home, HTN.   T11 compression fracture found on imaging performed in the ED on admission.   MRI scheduled for this morning was unable to be completed d/t patient's AMS with agitation.   Spoke with patient's daughter at bedside who reports that patient lives with her granddaughter and she or one of her sisters will stay with patient if granddaughter is not home. Someone is always with her. Over the past 4-5 months, intake has declined. They will give her yogurt, Ensure, V8 juice, or applesauce if she does not eat a meal. Daughter suspects patient has lost weight, but unsure of amount. Patient unable to provide any nutrition history d/t AMS.   Labs reviewed.  Medications reviewed and include ferrous sulfate, remeron, protonix, prednisone, IV Rocephin.  Weights reviewed. Suspect current documented weight, 64.4 kg, was carried over from last weight in December 2021.   NUTRITION - FOCUSED PHYSICAL EXAM:  Flowsheet Row Most Recent Value  Orbital Region No depletion  Upper Arm Region No depletion  Thoracic and Lumbar Region No depletion  Buccal Region No depletion  Temple Region No depletion  Clavicle Bone Region Mild depletion  Clavicle and Acromion Bone Region No depletion  Scapular Bone Region Unable to assess  Dorsal Hand Moderate  depletion  Patellar Region Mild depletion  Anterior Thigh Region Mild depletion  Posterior Calf Region Mild depletion  Edema (RD Assessment) None  Hair Reviewed  Eyes Reviewed  Mouth Reviewed  Skin Reviewed  Nails Reviewed       Diet Order:   Diet Order            Diet Heart Room service appropriate? Yes; Fluid consistency: Thin  Diet effective now                 EDUCATION NEEDS:   No education needs have been identified at this time  Skin:  Skin Assessment: Reviewed RN Assessment  Last BM:  no BM documented  Height:   Ht Readings from Last 1 Encounters:  03/30/21 5\' 2"  (1.575 m)    Weight:   Wt Readings from Last 1 Encounters:  03/30/21 64.4 kg    Ideal Body Weight:  50 kg  BMI:  Body mass index is 25.97 kg/m.  Estimated Nutritional Needs:   Kcal:  1550-1750  Protein:  80-90 gm  Fluid:  >/= 1.7 L    04/01/21, RD, LDN, CNSC Please refer to Amion for contact information.

## 2021-04-01 NOTE — Evaluation (Signed)
Occupational Therapy Evaluation Patient Details Name: Sheri [name] MRN: [id number] DOB: [birth year] Today's Date: 04/01/2021    History of Present Illness Pt is a 87 year old woman admitted on 03/31/21 with AMS, COPD exacerbation  and falls. Pt injured her L ankle/foot and hit her head in a fall. Head CT negative, MRI pending. PMH: COPD on 3L at home, t11 compression fx, HTN, AAA.   Clinical Impression   Pt walked with a RW at home with daughter reporting many falls. Daughters and granddaughter take turns caring for pt. She is assisted for showering and all IADL. Pt presents with impaired cognition, generalized weakness, zero standing balance and L ankle/foot pain. She needs max to total assist for ADL and stood briefly with +2 max assist. Recommending SNF upon discharge. Daughter is in agreement.    Follow Up Recommendations  SNF;Supervision/Assistance - 24 hour    Equipment Recommendations  3 in 1 bedside commode    Recommendations for Other Services       Precautions / Restrictions Precautions Precautions: Fall Precaution Comments: frequent falls at home (2-3x week)      Mobility Bed Mobility Overal bed mobility: Needs Assistance Bed Mobility: Supine to Sit;Sit to Supine     Supine to sit: Mod assist Sit to supine: Mod assist   General bed mobility comments: mod assist to raise trunk and position hips at EOB, assist for LEs back into bed, +2 total to pull up in bed with bed pad    Transfers Overall transfer level: Needs assistance   Transfers: Sit to/from Stand Sit to Stand: +2 physical assistance;Max assist         General transfer comment: reports L foot/ankle pain with weight bearing, assist to rise and steady    Balance Overall balance assessment: Needs assistance   Sitting balance-Leahy Scale: Poor   Postural control: Posterior lean Standing balance support: Bilateral upper extremity supported Standing balance-Leahy Scale: Zero                              ADL either performed or assessed with clinical judgement   ADL                                         General ADL Comments: pt able to wipe her face, but with poor thoroughness and task persistence, otherwise dependent     Vision Baseline Vision/History: 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) Patient Visual Report: No change from baseline       Perception     Praxis      Pertinent Vitals/Pain Pain Assessment: Faces Faces Pain Scale: Hurts even more Pain Location: L ankle Pain Descriptors / Indicators: Grimacing;Guarding;Discomfort Pain Intervention(s): Monitored during session;Repositioned     Hand Dominance Right   Extremity/Trunk Assessment Upper Extremity Assessment Upper Extremity Assessment: Generalized weakness   Lower Extremity Assessment Lower Extremity Assessment: Defer to PT evaluation   Cervical / Trunk Assessment Cervical / Trunk Assessment: Kyphotic   Communication Communication Communication: HOH;Expressive difficulties   Cognition Arousal/Alertness: Awake/alert Behavior During Therapy: Flat affect Overall Cognitive Status: Impaired/Different from baseline Area of Impairment: Orientation;Attention;Memory;Following commands;Safety/judgement;Awareness;Problem solving                 Orientation Level: Disoriented to;Place;Time;Situation Current Attention Level: Focused Memory: Decreased short-term memory Following Commands: Follows one step commands with increased time;Follows one step  commands inconsistently Safety/Judgement: Decreased awareness of safety;Decreased awareness of deficits Awareness: Intellectual Problem Solving: Slow processing;Decreased initiation;Difficulty sequencing;Requires verbal cues;Requires tactile cues General Comments: attempting to pull out IV, nursing notified and wrapped site with gauze   General Comments       Exercises     Shoulder Instructions      Home Living Family/patient  expects to be discharged to:: Private residence Living Arrangements: Other relatives (granddaughter) Available Help at Discharge: Family;Available 24 hours/day (family takes turns) Type of Home: House Home Access: Stairs to enter [employer] of Steps: 4 Entrance Stairs-Rails: Right Home Layout: One level     Bathroom Shower/Tub: Chief Strategy Officer: Standard     Home Equipment: Environmental consultant - 2 wheels;Shower seat;Walker - 4 wheels          Prior Functioning/Environment Level of Independence: Needs assistance  Gait / Transfers Assistance Needed: walks with RW ADL's / Homemaking Assistance Needed: assisted for sponge bathing   Comments: daughter Angelique Blonder) cannot provide physical assistance        OT Problem List: Decreased strength;Impaired balance (sitting and/or standing);Decreased activity tolerance;Decreased cognition;Decreased safety awareness;Decreased knowledge of use of DME or AE;Pain      OT Treatment/Interventions: Self-care/ADL training;DME and/or AE instruction;Therapeutic activities;Cognitive remediation/compensation;Patient/family education;Balance training    OT Goals(Current goals can be found in the care plan section) Acute Rehab OT Goals Patient Stated Goal: to get up OT Goal Formulation: With family Time For Goal Achievement: 04/15/21 Potential to Achieve Goals: Fair ADL Goals Pt Will Perform Eating: with min assist;sitting Pt Will Perform Grooming: with min assist;sitting Pt Will Perform Upper Body Dressing: with mod assist;sitting Pt Will Transfer to Toilet: with mod assist;stand pivot transfer;bedside commode Additional ADL Goal #1: Pt will perform bed mobility with min guard assist in preparation for ADL. Additional ADL Goal #2: Pt will demonstrate sustained attention as a precursor to ADL.  OT Frequency: Min 2X/week   Barriers to D/C: Decreased caregiver support          Co-evaluation PT/OT/SLP Co-Evaluation/Treatment:  Yes Reason for Co-Treatment: Necessary to address cognition/behavior during functional activity;For patient/therapist safety          AM-PAC OT "6 Clicks" Daily Activity     Outcome Measure Help from another person eating meals?: Total Help from another person taking care of personal grooming?: A Lot Help from another person toileting, which includes using toliet, bedpan, or urinal?: Total Help from another person bathing (including washing, rinsing, drying)?: Total Help from another person to put on and taking off regular upper body clothing?: Total Help from another person to put on and taking off regular lower body clothing?: Total 6 Click Score: 7   End of Session Equipment Utilized During Treatment: Oxygen  Activity Tolerance: Patient tolerated treatment well Patient left: in bed;with call bell/phone within reach;with bed alarm set;with family/visitor present  OT Visit Diagnosis: Pain;Muscle weakness (generalized) (M62.81);Unsteadiness on feet (R26.81);Other symptoms and signs involving cognitive function                Time: 2122-4825 OT Time Calculation (min): 27 min Charges:  OT General Charges $OT Visit: 1 Visit OT Evaluation $OT Eval Moderate Complexity: 1 Mod  Martie Round, OTR/L Acute Rehabilitation Services Pager: [phone] Office: [phone]  Evern Bio 04/01/2021, 12:29 PM

## 2021-04-01 NOTE — Progress Notes (Signed)
Patient arrived in MRI; when getting her ready for scan and removing monitors patient became agitated.  She started to resist staff.  Staff called patients RN to ask if more meds could be given per RN they could not.  At this time the patient began stripping her gown off and trying to climb out of bed.  RN notified that MRI unable to happen at this time with patient in this condition and patient to be sent back to floor.

## 2021-04-01 NOTE — Progress Notes (Addendum)
PROGRESS NOTE  Sheri [name] WFU:[id number] DOB: [birth year]   PCP: Renaye Rakers, MD  Patient is from: Home.  Uses walker at baseline.  DOA: 03/30/2021 LOS: 1  Chief complaints: Altered mental status  Brief Narrative / Interim history: 87 year old F with PMH of COPD and chronic RF on 3 L, HTN, DM-2, T11 compression fracture and osteoarthritis presenting with altered mental status for 2 days.  Patient has had a fall 4 days prior to admission.  Per patient's daughter, patient had recurrent falls over the last 4 months with leg giving out.  No history of dementia.     In ED, VBG with acute on chronic respiratory acidosis with hypercapnia.  CMP, CBC and EKG without significant finding.  CT head and cervical spine, DG left foot, DG left ankle, CXR and DG lumbar spine without acute finding.  DG thoracic spine with T11 compression fracture with some increase in vertebral body height loss, about 50% anteriorly.  Patient was admitted for acute metabolic encephalopathy and COPD exacerbation.  Started on BiPAP and COPD pathway.  MRI brain ordered but patient could not tolerate.  Subjective: Seen and examined earlier this morning.  No major events overnight of this morning.  She reports bilateral knee pain but no other complaints.  She denies headache, chest pain, shortness of breath, GI or UTI symptoms or back pain.  However, she is not a reliable historian.  She is awake but not quite alert.  She is only oriented to self, "[facility], [place] and [place]" but not able to tell the state or time.   Objective: Vitals:   03/31/21 1900 04/01/21 0049 04/01/21 1028 04/01/21 1208  BP: (!) 161/86 107/62 (!) 156/73 127/89  Pulse: 88 65 74 70  Resp: 20 18 16    Temp: 98.5 F (36.9 C) 99.5 F (37.5 C) 98.7 F (37.1 C)   TempSrc: Oral Oral Oral   SpO2: 99% 97% 100%   Weight:      Height:        Intake/Output Summary (Last 24 hours) at 04/01/2021 1300 Last data filed at 04/01/2021 0831 Gross per 24 hour   Intake 459.86 ml  Output 200 ml  Net 259.86 ml   Filed Weights   03/30/21 1957  Weight: 64.4 kg    Examination:  GENERAL: No apparent distress.  Nontoxic. HEENT: MMM.  Vision and hearing grossly intact.  NECK: Supple.  No apparent JVD.  RESP: 97% on 3 L.  No IWOB.  Fair aeration bilaterally. CVS:  RRR. Heart sounds normal.  ABD/GI/GU: BS+. Abd soft, NTND.  MSK/EXT:  Moves extremities. No apparent deformity. No edema.  SKIN: no apparent skin lesion or wound NEURO: Awake but not quite alert.  Oriented to self, "04/01/21, [place], [place]".  Follows some commands.  No apparent focal neuro deficit other than generalized weakness. PSYCH: Calm. Normal affect.   Procedures:  None  Microbiology summarized: COVID-19 and influenza PCR nonreactive.  Assessment & Plan: Acute metabolic encephalopathy-due to hypercapnia?  Not on sedating medications.  No signs of infection.  No focal neurodeficit to suggest CVA.  CT head without acute finding but chronic microvascular changes.  Patient did not tolerate MRI brain with Ativan sedation.  -Manage COPD as below -Basic encephalopathy labs-ammonia, TSH, RPR, B12 and B1 -EEG and MRI brain if no improvement -Avoid or minimize sedating medications -PT/OT/SLP eval -Reorientation and delirium precautions  Acute on chronic RF with hypercapnia due to COPD exacerbation: Initial VBG with acute on chronic respiratory acidosis and  hypercapnia to 88.  Improved on repeat ABG. -COPD pathway with systemic steroid, antibiotics and nebulizers/inhalers -Wean oxygen as able.  Minimum oxygen to keep saturation above 88%.  Discussed with RN  Essential hypertension/pulmonary hypertension: Followed by [facility] cardiology team. -Continue home Cardizem and metoprolol. The later might cause bronchospasm  Esophageal dysphagia: Per daughter, she has not EGD with esophageal stretching by GI -SLP evaluation  Chronic T11 compression fracture: About 50% height loss  anteriorly, slightly progressed.  Not in pain.  No retropulsion. -Symptomatic management  NIDDM-2 Recent Labs  Lab 03/30/21 2010  GLUCAP 96  -Check CBG twice a day -Start sliding scale if CBG high -Continue Tradjenta  CKD-3A Recent Labs    10/28/20 1900 03/30/21 2015 03/31/21 0528 04/01/21 0403  BUN 15 18 19 17   CREATININE 1.30* 1.22* 1.26* 1.10*   Normocytic anemia: H&H stable. Recent Labs    10/28/20 1900 03/30/21 2015 03/30/21 2123 03/31/21 0035 03/31/21 0500 03/31/21 0528 04/01/21 0403  HGB 10.4* 9.4* 9.4* 10.9* 9.9* 9.4* 8.8*  -Check anemia panel  Addendum Recurrent falls -reportedly knees giving out. Uses walker at baseline. Reports knee pain -Check b/l knee X-rays -PT/OT -Fall precautions    Heterogenicity of thyroid gland -No follow-up needed given age  Right submandibular sialoliths -swish with lemon juice  Goal of care: 87 years of age with COPD and chronic respiratory failure.  Still full code.  I believe this would pose more harm than benefit.  I have discussed the pros and cons of CPR and intubation with patient's daughter, 89 over the phone who prefers to keep full CODE STATUS for now.  Body mass index is 25.97 kg/m. Nutrition Problem: Increased nutrient needs Etiology: chronic illness (COPD) Signs/Symptoms: estimated needs Interventions: Ensure Enlive (each supplement provides 350kcal and 20 grams of protein),MVI   DVT prophylaxis:  enoxaparin (LOVENOX) injection 30 mg Start: 03/31/21 1400  Code Status: Full code Family Communication: Updated patient's daughters at bedside and over the phone Level of care: Telemetry Medical Status is: Inpatient  Remains inpatient appropriate because:Altered mental status, Unsafe d/c plan and Inpatient level of care appropriate due to severity of illness   Dispo: The patient is from: Home              Anticipated d/c is to: SNF              Patient currently is not medically stable to d/c.    Difficult to place patient No       Consultants:  None   Sch Meds:  Scheduled Meds: . amLODipine  5 mg Oral Daily  . enoxaparin (LOVENOX) injection  30 mg Subcutaneous Q24H  . feeding supplement  237 mL Oral TID BM  . ferrous sulfate  325 mg Oral Q breakfast  . fluticasone furoate-vilanterol  1 puff Inhalation Daily   And  . umeclidinium bromide  1 puff Inhalation Daily  . gabapentin  100 mg Oral BID  . hydrochlorothiazide  12.5 mg Oral Daily  . latanoprost  1 drop Right Eye QHS  . linagliptin  5 mg Oral Daily  . mouth rinse  15 mL Mouth Rinse BID  . metoprolol succinate  50 mg Oral Daily  . mirtazapine  7.5 mg Oral QHS  . multivitamin with minerals  1 tablet Oral Daily  . pantoprazole  40 mg Oral Daily  . predniSONE  40 mg Oral Q breakfast  . rosuvastatin  20 mg Oral QHS  . verapamil  240 mg Oral QHS  Continuous Infusions: . sodium chloride Stopped (03/31/21 0742)  . albuterol Stopped (03/31/21 0400)  . cefTRIAXone (ROCEPHIN)  IV 1 g (04/01/21 0346)   PRN Meds:.albuterol, albuterol, hydrALAZINE  Antimicrobials: Anti-infectives (From admission, onward)   Start     Dose/Rate Route Frequency Ordered Stop   04/01/21 0400  cefTRIAXone (ROCEPHIN) 1 g in sodium chloride 0.9 % 100 mL IVPB        1 g 200 mL/hr over 30 Minutes Intravenous Every 24 hours 03/31/21 0424     03/31/21 0430  cefTRIAXone (ROCEPHIN) 1 g in sodium chloride 0.9 % 100 mL IVPB        1 g 200 mL/hr over 30 Minutes Intravenous  Once 03/31/21 0426 03/31/21 0511       I have personally reviewed the following labs and images: CBC: Recent Labs  Lab 03/30/21 2015 03/30/21 2123 03/31/21 0035 03/31/21 0500 03/31/21 0528 04/01/21 0403  WBC 9.0 8.8  --   --  5.9 10.2  NEUTROABS  --  5.7  --   --   --   --   HGB 9.4* 9.4* 10.9* 9.9* 9.4* 8.8*  HCT 31.1* 31.2* 32.0* 29.0* 31.7* 28.3*  MCV 90.7 90.2  --   --  91.4 88.2  PLT 301 302  --   --  144* 327   BMP &GFR Recent Labs  Lab 03/30/21 2015  03/31/21 0035 03/31/21 0500 03/31/21 0528 04/01/21 0403  NA 135 138 136 136 139  K 4.4 4.3 4.3 4.6 4.1  CL 92*  --   --  90* 94*  CO2 33*  --   --  34* 34*  GLUCOSE 105*  --   --  147* 124*  BUN 18  --   --  19 17  CREATININE 1.22*  --   --  1.26* 1.10*  CALCIUM 8.8*  --   --  8.7* 9.0   Estimated Creatinine Clearance: 28.7 mL/min (A) (by C-G formula based on SCr of 1.1 mg/dL (H)). Liver & Pancreas: Recent Labs  Lab 03/30/21 2015 04/01/21 0403  AST 21 26  ALT 11 13  ALKPHOS 47 40  BILITOT 0.6 0.5  PROT 6.8 6.3*  ALBUMIN 2.7* 2.5*   No results for input(s): LIPASE, AMYLASE in the last 168 hours. No results for input(s): AMMONIA in the last 168 hours. Diabetic: No results for input(s): HGBA1C in the last 72 hours. Recent Labs  Lab 03/30/21 2010  GLUCAP 96   Cardiac Enzymes: No results for input(s): CKTOTAL, CKMB, CKMBINDEX, TROPONINI in the last 168 hours. No results for input(s): PROBNP in the last 8760 hours. Coagulation Profile: Recent Labs  Lab 03/30/21 2018  INR 1.1   Thyroid Function Tests: No results for input(s): TSH, T4TOTAL, FREET4, T3FREE, THYROIDAB in the last 72 hours. Lipid Profile: No results for input(s): CHOL, HDL, LDLCALC, TRIG, CHOLHDL, LDLDIRECT in the last 72 hours. Anemia Panel: No results for input(s): VITAMINB12, FOLATE, FERRITIN, TIBC, IRON, RETICCTPCT in the last 72 hours. Urine analysis:    Component Value Date/Time   COLORURINE YELLOW 03/30/2021 2350   APPEARANCEUR CLEAR 03/30/2021 2350   LABSPEC 1.016 03/30/2021 2350   PHURINE 5.0 03/30/2021 2350   GLUCOSEU NEGATIVE 03/30/2021 2350   HGBUR SMALL (A) 03/30/2021 2350   BILIRUBINUR NEGATIVE 03/30/2021 2350   KETONESUR NEGATIVE 03/30/2021 2350   PROTEINUR NEGATIVE 03/30/2021 2350   NITRITE NEGATIVE 03/30/2021 2350   LEUKOCYTESUR NEGATIVE 03/30/2021 2350   Sepsis Labs: Invalid input(s): PROCALCITONIN, LACTICIDVEN  Microbiology: Recent Results (  from the past 240 hour(s))   Resp Panel by RT-PCR (Flu A&B, Covid) Nasopharyngeal Swab     Status: None   Collection Time: 03/30/21  8:18 PM   Specimen: Nasopharyngeal Swab; Nasopharyngeal(NP) swabs in vial transport medium  Result Value Ref Range Status   SARS Coronavirus 2 by RT PCR NEGATIVE NEGATIVE Final    Comment: (NOTE) SARS-CoV-2 target nucleic acids are NOT DETECTED.  The SARS-CoV-2 RNA is generally detectable in upper respiratory specimens during the acute phase of infection. The lowest concentration of SARS-CoV-2 viral copies this assay can detect is 138 copies/mL. A negative result does not preclude SARS-Cov-2 infection and should not be used as the sole basis for treatment or other patient management decisions. A negative result may occur with  improper specimen collection/handling, submission of specimen other than nasopharyngeal swab, presence of viral mutation(s) within the areas targeted by this assay, and inadequate number of viral copies(<138 copies/mL). A negative result must be combined with clinical observations, patient history, and epidemiological information. The expected result is Negative.  Fact Sheet for Patients:  BloggerCourse.comhttps://www.fda.gov/media/152166/download  Fact Sheet for Healthcare Providers:  SeriousBroker.ithttps://www.fda.gov/media/152162/download  This test is no t yet approved or cleared by the [place] FDA and  has been authorized for detection and/or diagnosis of SARS-CoV-2 by FDA under an Emergency Use Authorization (EUA). This EUA will remain  in effect (meaning this test can be used) for the duration of the COVID-19 declaration under Section 564(b)(1) of the Act, 21 U.S.C.section 360bbb-3(b)(1), unless the authorization is terminated  or revoked sooner.       Influenza A by PCR NEGATIVE NEGATIVE Final   Influenza B by PCR NEGATIVE NEGATIVE Final    Comment: (NOTE) The Xpert Xpress SARS-CoV-2/FLU/RSV plus assay is intended as an aid in the diagnosis of influenza from  Nasopharyngeal swab specimens and should not be used as a sole basis for treatment. Nasal washings and aspirates are unacceptable for Xpert Xpress SARS-CoV-2/FLU/RSV testing.  Fact Sheet for Patients: BloggerCourse.comhttps://www.fda.gov/media/152166/download  Fact Sheet for Healthcare Providers: SeriousBroker.ithttps://www.fda.gov/media/152162/download  This test is not yet approved or cleared by the [place] FDA and has been authorized for detection and/or diagnosis of SARS-CoV-2 by FDA under an Emergency Use Authorization (EUA). This EUA will remain in effect (meaning this test can be used) for the duration of the COVID-19 declaration under Section 564(b)(1) of the Act, 21 U.S.C. section 360bbb-3(b)(1), unless the authorization is terminated or revoked.  Performed at [facility], [address] [place], [place] [address]     Radiology Studies: No results found.    Neftali Abair T. Anni Hocevar [facility]  If 7PM-7AM, please contact night-coverage [email or web address] 04/01/2021, 1:00 PM

## 2021-04-01 NOTE — Evaluation (Signed)
Physical Therapy Evaluation Patient Details Name: Sheri [name] MRN: [id number] DOB: [birth year] Today's Date: 04/01/2021   History of Present Illness  Pt is a 87 year old woman admitted on 03/31/21 with AMS, COPD exacerbation  and falls. Pt injured her L ankle/foot and hit her head in a fall. Head CT negative, MRI pending. PMH: COPD on 3L at home, t11 compression fx, HTN, AAA.  Clinical Impression  PTA pt living with granddaughter in single story home with 4 steps to enter. Pt daughter present and provides PLOF and home set up. Pt was ambulating around home with RW however has not been using it lately and has been experiencing 2-3 falls per week. Daughter and granddaughter assist pt with bADLs and iADLs. Pt is limited in safe mobility by decreased cognition in presence of decreased strength and balance. Pt requires modA for bed mobility, min guard to mod A for maintaining seated balance and maxAx2 for standing due to L ankle pain in upright. Pt unable to take steps. PT recommending SNF level rehab at discharge to improve safe mobility. PT will continue to follow acutely.     Follow Up Recommendations SNF    Equipment Recommendations  None recommended by PT       Precautions / Restrictions Precautions Precautions: Fall Precaution Comments: frequent falls at home (2-3x week)      Mobility  Bed Mobility Overal bed mobility: Needs Assistance Bed Mobility: Supine to Sit;Sit to Supine     Supine to sit: Mod assist Sit to supine: Mod assist   General bed mobility comments: mod assist to raise trunk and position hips at EOB, assist for LEs back into bed, +2 total to pull up in bed with bed pad    Transfers Overall transfer level: Needs assistance   Transfers: Sit to/from Stand Sit to Stand: +2 physical assistance;Max assist         General transfer comment: reports L foot/ankle pain with weight bearing, assist to rise and steady  Ambulation/Gait             General Gait  Details: unable         Balance Overall balance assessment: Needs assistance   Sitting balance-Leahy Scale: Poor   Postural control: Posterior lean Standing balance support: Bilateral upper extremity supported Standing balance-Leahy Scale: Zero                               Pertinent Vitals/Pain Pain Assessment: Faces Faces Pain Scale: Hurts even more Pain Location: L ankle Pain Descriptors / Indicators: Grimacing;Guarding;Discomfort Pain Intervention(s): Limited activity within patient's tolerance;Monitored during session;Repositioned    Home Living Family/patient expects to be discharged to:: Private residence Living Arrangements: Other relatives (granddaughter) Available Help at Discharge: Family;Available 24 hours/day (family takes turns) Type of Home: House Home Access: Stairs to enter Entrance Stairs-Rails: Right Entrance Stairs-Number of Steps: 4 Home Layout: One level Home Equipment: Walker - 2 wheels;Shower seat;Walker - 4 wheels      Prior Function Level of Independence: Needs assistance   Gait / Transfers Assistance Needed: walks with RW  ADL's / Homemaking Assistance Needed: assisted for sponge bathing  Comments: daughter Angelique Blonder) cannot provide physical assistance     Hand Dominance   Dominant Hand: Right    Extremity/Trunk Assessment   Upper Extremity Assessment Upper Extremity Assessment: Defer to OT evaluation    Lower Extremity Assessment Lower Extremity Assessment: LLE deficits/detail;Difficult to assess due to impaired cognition  LLE Deficits / Details: L ankle pain with standing    Cervical / Trunk Assessment Cervical / Trunk Assessment: Kyphotic  Communication   Communication: HOH;Expressive difficulties  Cognition Arousal/Alertness: Awake/alert Behavior During Therapy: Flat affect Overall Cognitive Status: Impaired/Different from baseline Area of Impairment: Orientation;Attention;Memory;Following  commands;Safety/judgement;Awareness;Problem solving                 Orientation Level: Disoriented to;Place;Time;Situation Current Attention Level: Focused Memory: Decreased short-term memory Following Commands: Follows one step commands with increased time;Follows one step commands inconsistently Safety/Judgement: Decreased awareness of safety;Decreased awareness of deficits Awareness: Intellectual Problem Solving: Slow processing;Decreased initiation;Difficulty sequencing;Requires verbal cues;Requires tactile cues General Comments: attempting to pull out IV, nursing notified and wrapped site with gauze      General Comments General comments (skin integrity, edema, etc.): Pt on 3L O2 via [place], VSS        Assessment/Plan    PT Assessment Patient needs continued PT services  PT Problem List Decreased strength;Decreased activity tolerance;Decreased balance;Decreased mobility;Decreased range of motion;Decreased cognition;Decreased coordination;Decreased safety awareness;Pain       PT Treatment Interventions Gait training;Functional mobility training;Therapeutic activities;Therapeutic exercise;Balance training;Cognitive remediation;Patient/family education    PT Goals (Current goals can be found in the Care Plan section)  Acute Rehab PT Goals Patient Stated Goal: to get up PT Goal Formulation: Patient unable to participate in goal setting Time For Goal Achievement: 04/15/21 Potential to Achieve Goals: Fair    Frequency Min 2X/week        Co-evaluation   Reason for Co-Treatment: Necessary to address cognition/behavior during functional activity;For patient/therapist safety           AM-PAC PT "6 Clicks" Mobility  Outcome Measure Help needed turning from your back to your side while in a flat bed without using bedrails?: A Little Help needed moving from lying on your back to sitting on the side of a flat bed without using bedrails?: A Lot Help needed moving to and  from a bed to a chair (including a wheelchair)?: A Lot Help needed standing up from a chair using your arms (e.g., wheelchair or bedside chair)?: Total Help needed to walk in hospital room?: Total Help needed climbing 3-5 steps with a railing? : Total 6 Click Score: 10    End of Session Equipment Utilized During Treatment: Oxygen Activity Tolerance: Patient limited by pain Patient left: in bed;with call bell/phone within reach;with bed alarm set;with family/visitor present Nurse Communication: Mobility status;Other (comment) (pt picking at IV) PT Visit Diagnosis: Unsteadiness on feet (R26.81);Other abnormalities of gait and mobility (R26.89);Repeated falls (R29.6);Muscle weakness (generalized) (M62.81);Difficulty in walking, not elsewhere classified (R26.2);History of falling (Z91.81);Pain Pain - Right/Left: Left Pain - part of body: Ankle and joints of foot    Time: 1115-1141 PT Time Calculation (min) (ACUTE ONLY): 26 min   Charges:   PT Evaluation $PT Eval Moderate Complexity: 1 Mod          Lakiah Dhingra B. Beverely Risen PT, DPT Acute Rehabilitation Services Pager [phone] Office [phone]   Elon Alas Fleet 04/01/2021, 12:41 PM

## 2021-04-01 NOTE — Evaluation (Signed)
Clinical/Bedside Swallow Evaluation Patient Details  Name: Sheri [name] MRN: [id number] Date of Birth: 09-15-1926  Today's Date: 04/01/2021 Time: SLP Start Time (ACUTE ONLY): 1715 SLP Stop Time (ACUTE ONLY): 1733 SLP Time Calculation (min) (ACUTE ONLY): 18.67 min  Past Medical History:  Past Medical History:  Diagnosis Date  . Bronchitis   . COPD (chronic obstructive pulmonary disease) (HCC)   . Hypertension    Past Surgical History:  Past Surgical History:  Procedure Laterality Date  . EYE SURGERY    . TUBAL LIGATION     HPI:  Pt is a 87 year old woman admitted on 03/31/21 with AMS, COPD exacerbation  and falls. Pt injured her L ankle/foot and hit her head in a fall. Head CT negative, CXR on admission negative. Esophageal dysphagia reported by pt's daughter to referring MD. Esophagram 08/06/19: Esophageal dysmotility, likely presbyesophagus. Small hiatal hernia with mild stricture as evidenced by underdistention at the gastroesophageal junction. PMH: COPD on 3L at home, t11 compression fx, HTN, AAA.   Assessment / Plan / Recommendation Clinical Impression  Pt was seen for bedside swallow evaluation with her daughters present. Both parties reported that the pt has been demonstrating inconsistent coughing and reporting globus sensation (family identified mid chest; pt identified pharynx). Per the pt's daughter, pt has has had two esophageal dilations and has presented similarly prior with improvement post dilation. Oral mechanism exam was [facility] and she presented with full dentures. Pt demonstrated symptoms of pharyngoesophageal dysphagia characterized by inconsistent coughing with p.o. intake, multiple swallows, reports of globus sensation and frequent eructation. Mastication was prolonged, but oral clearance adequate despite family's report of oral holding with subsequent spitting. A modified barium swallow study is recommended at this time to further assess the pharyngeal phase of the  swallow, but considering pt's history and presentation, SLP also recommends esophageal assessment and/or GI consult. The MBS will be planned for 6/2, but it may need to be deferred pending plan for MRI with sedation. Pt's current diet will be continued until the study is completed. SLP Visit Diagnosis: Dysphagia, unspecified (R13.10)    Aspiration Risk  Mild aspiration risk    Diet Recommendation Regular;Thin liquid (Continue current diet)   Liquid Administration via: Cup;Straw Medication Administration: Crushed with puree Compensations: Slow rate;Small sips/bites;Follow solids with liquid Postural Changes: Seated upright at 90 degrees    Other  Recommendations Recommended Consults: Consider GI evaluation;Consider esophageal assessment Oral Care Recommendations: Oral care BID   Follow up Recommendations  (TBD)      Frequency and Duration min 2x/week  2 weeks       Prognosis Prognosis for Safe Diet Advancement: Fair Barriers to Reach Goals: Cognitive deficits;Time post onset      Swallow Study   General Date of Onset: 03/31/21 HPI: Pt is a 87 year old woman admitted on 03/31/21 with AMS, COPD exacerbation  and falls. Pt injured her L ankle/foot and hit her head in a fall. Head CT negative, CXR on admission negative. Esophageal dysphagia reported by pt's daughter to referring MD. Esophagram 08/06/19: Esophageal dysmotility, likely presbyesophagus. Small hiatal hernia with mild stricture as evidenced by underdistention at the gastroesophageal junction. PMH: COPD on 3L at home, t11 compression fx, HTN, AAA. Type of Study: Bedside Swallow Evaluation Previous Swallow Assessment: none Diet Prior to this Study: Regular;Thin liquids Temperature Spikes Noted: No Respiratory Status: Nasal cannula History of Recent Intubation: No Behavior/Cognition: Alert;Confused Oral Cavity Assessment: Within Functional Limits Oral Care Completed by SLP: No Oral Cavity - Dentition:  Dentures,  top;Dentures, bottom Vision: Functional for self-feeding Self-Feeding Abilities: Needs assist Patient Positioning: Upright in bed;Postural control adequate for testing Baseline Vocal Quality: Normal Volitional Cough: Cognitively unable to elicit Volitional Swallow: Unable to elicit    Oral/Motor/Sensory Function Overall Oral Motor/Sensory Function: Within functional limits   Ice Chips Ice chips: Within functional limits Presentation: Spoon   Thin Liquid Thin Liquid: Within functional limits Presentation: Straw;Cup    Nectar Thick Nectar Thick Liquid: Not tested   Honey Thick Honey Thick Liquid: Not tested   Puree Puree: Impaired Pharyngeal Phase Impairments: Multiple swallows;Cough - Delayed;Cough - Immediate   Solid     Solid: Impaired Presentation: Self Fed Pharyngeal Phase Impairments: Multiple swallows;Cough - Delayed (> 4 mins after trials)     [name] I. Vear Clock, MS, CCC-SLP Acute Rehabilitation Services Office number [phone] Pager [phone]  [name] 04/01/2021,6:06 PM

## 2021-04-01 NOTE — Plan of Care (Signed)
  Problem: Clinical Measurements: Goal: Diagnostic test results will improve Outcome: Completed/Met

## 2021-04-02 DIAGNOSIS — E876 Hypokalemia: Secondary | ICD-10-CM

## 2021-04-02 LAB — RENAL FUNCTION PANEL
Albumin: 2.7 g/dL — ABNORMAL LOW (ref 3.5–5.0)
Anion gap: 10 (ref 5–15)
BUN: 16 mg/dL (ref 8–23)
CO2: 33 mmol/L — ABNORMAL HIGH (ref 22–32)
Calcium: 9.2 mg/dL (ref 8.9–10.3)
Chloride: 94 mmol/L — ABNORMAL LOW (ref 98–111)
Creatinine, Ser: 0.97 mg/dL (ref 0.44–1.00)
GFR, Estimated: 55 mL/min — ABNORMAL LOW (ref >60)
Glucose, Bld: 116 mg/dL — ABNORMAL HIGH (ref 70–99)
Phosphorus: 1.6 mg/dL — ABNORMAL LOW (ref 2.5–4.6)
Potassium: 3.6 mmol/L (ref 3.5–5.1)
Sodium: 137 mmol/L (ref 135–145)

## 2021-04-02 LAB — AMMONIA: Ammonia: 24 umol/L (ref 9–35)

## 2021-04-02 LAB — CBC
HCT: 31.6 % — ABNORMAL LOW (ref 36.0–46.0)
Hemoglobin: 9.9 g/dL — ABNORMAL LOW (ref 12.0–15.0)
MCH: 27.3 pg (ref 26.0–34.0)
MCHC: 31.3 g/dL (ref 30.0–36.0)
MCV: 87.3 fL (ref 80.0–100.0)
Platelets: 333 10*3/uL (ref 150–400)
RBC: 3.62 MIL/uL — ABNORMAL LOW (ref 3.87–5.11)
RDW: 14.2 % (ref 11.5–15.5)
WBC: 8 10*3/uL (ref 4.0–10.5)
nRBC: 0 % (ref 0.0–0.2)

## 2021-04-02 LAB — GLUCOSE, CAPILLARY
Glucose-Capillary: 103 mg/dL — ABNORMAL HIGH (ref 70–99)
Glucose-Capillary: 123 mg/dL — ABNORMAL HIGH (ref 70–99)
Glucose-Capillary: 186 mg/dL — ABNORMAL HIGH (ref 70–99)
Glucose-Capillary: 168 mg/dL — ABNORMAL HIGH (ref 70–99)

## 2021-04-02 LAB — RPR
RPR Ser Ql: REACTIVE — AB
RPR Titer: 1:1 {titer}

## 2021-04-02 LAB — CK: Total CK: 246 U/L — ABNORMAL HIGH (ref 38–234)

## 2021-04-02 LAB — MAGNESIUM: Magnesium: 1.7 mg/dL (ref 1.7–2.4)

## 2021-04-02 MED ORDER — POTASSIUM PHOSPHATES 15 MMOLE/5ML IV SOLN
30.0000 mmol | Freq: Once | INTRAVENOUS | Status: AC
  Administered 2021-04-02: 30 mmol via INTRAVENOUS
  Filled 2021-04-02: qty 10

## 2021-04-02 MED ORDER — DICLOFENAC SODIUM 1 % EX GEL
2.0000 g | Freq: Four times a day (QID) | CUTANEOUS | Status: DC
  Administered 2021-04-02 – 2021-04-21 (×63): 2 g via TOPICAL
  Filled 2021-04-02 (×3): qty 100

## 2021-04-02 MED ORDER — MAGNESIUM SULFATE 2 GM/50ML IV SOLN
2.0000 g | Freq: Once | INTRAVENOUS | Status: AC
  Administered 2021-04-02: 2 g via INTRAVENOUS
  Filled 2021-04-02: qty 50

## 2021-04-02 MED ORDER — ACETAMINOPHEN 500 MG PO TABS
500.0000 mg | ORAL_TABLET | Freq: Three times a day (TID) | ORAL | Status: DC
  Administered 2021-04-02 – 2021-04-21 (×48): 500 mg via ORAL
  Filled 2021-04-02 (×47): qty 1

## 2021-04-02 NOTE — Progress Notes (Signed)
PROGRESS NOTE  Sheri [name] WUJ:[id number] DOB: 1926/03/06   PCP: Renaye RakersBland, Veita, MD  Patient is from: Home.  Uses walker at baseline.  DOA: 03/30/2021 LOS: 2  Chief complaints: Altered mental status  Brief Narrative / Interim history: 87 year old F with PMH of COPD and chronic RF on 3 L, HTN, DM-2, T11 compression fracture and osteoarthritis presenting with altered mental status for 2 days.  Patient has had a fall 4 days prior to admission.  Per patient's daughter, patient had recurrent falls over the last 4 months with leg giving out.  No history of dementia.   In ED, VBG with acute on chronic respiratory acidosis with hypercapnia.CT head and cervical spine, DG left foot, DG left ankle, b/l knees, thoracic/lumbar spine without acute finding. Patient was admitted for acute metabolic encephalopathy likely from hypercapnia in the setting of COPD exacerbation.  Started on BiPAP and COPD pathway. MRI brain ordered but patient could not tolerate. Patient will have MRI brain under anesthesia on 6/3.  Subjective: Seen and examined earlier this morning.  No major events overnight of this morning.  She is awake but only oriented to self.  She thinks she is at discharge.  She follows command.  She denies pain, shortness of breath, GI or UTI symptoms.  No apparent distress.  Objective: Vitals:   04/02/21 0440 04/02/21 0700 04/02/21 0702 04/02/21 0922  BP: 136/75   136/65  Pulse: 71   70  Resp: 15   18  Temp: 98.4 F (36.9 C)   98.2 F (36.8 C)  TempSrc:      SpO2: 97% (!) 66% 92% 93%  Weight:      Height:        Intake/Output Summary (Last 24 hours) at 04/02/2021 1453 Last data filed at 04/02/2021 1002 Gross per 24 hour  Intake 520 ml  Output 1900 ml  Net -1380 ml   Filed Weights   03/30/21 1957  Weight: 64.4 kg    Examination:  GENERAL: No apparent distress.  Nontoxic. HEENT: MMM.  Vision and hearing grossly intact. Whitish patch her left pupil, appear chronic.  NECK: Supple.   No apparent JVD.  RESP: 92% on 0.5 L.  No IWOB.  Fair aeration bilaterally. CVS:  RRR. Heart sounds normal.  ABD/GI/GU: BS+. Abd soft, NTND.  MSK/EXT:  Moves extremities.  Significant muscle mass and subcu fat loss. SKIN: no apparent skin lesion or wound NEURO: Awake.  Oriented to only to self.  Follows commands.  No apparent focal neuro deficit. PSYCH: Calm. Normal affect.   Procedures:  None  Microbiology summarized: COVID-19 and influenza PCR nonreactive.  Assessment & Plan: Acute metabolic encephalopathy-likely due to hypercapnia.  Not on sedating meds.  No signs of infection.  No focal neurodeficit to suggest CVA.  CT head without acute finding but cerebral atrophy with chronic microvascular changes.  Basic encephalopathy work-up unrevealing except for mildly elevated ammonia.  Per daughters, intact cognition at baseline.   -Manage COPD as below -MRI under general anesthesia scheduled at 11:15 AM on 6/3. -Avoid or minimize sedating medications -PT/OT/SLP eval -Reorientation and delirium precautions  Acute on chronic RF with hypercapnia due to COPD exacerbation: Initial VBG with acute on chronic respiratory acidosis and hypercapnia to 88.  Improved on repeat ABG. -COPD pathway with systemic steroid, antibiotics and nebulizers/inhalers -Wean oxygen to room air.  Goal saturation 88% and above with minimum oxygen!  Recurrent falls-reportedly knees giving out. Uses walker at baseline.  Multiple x-rays without acute finding.  Only arthritis. -Voltaren gel and scheduled Tylenol -PT/OT -Fall precautions  Essential hypertension/pulmonary hypertension: Followed by [facility] cardiology team. -Continue home Cardizem, metoprolol and HCTZ.   Esophageal dysphagia: Per daughter, she has not EGD with esophageal stretching by GI -Regular diet per SLP.  Chronic T11 compression fracture: slightly progressed to about 50% height loss but no retropulsion.  She has no back pain or  neurodeficit.  NIDDM-2 Recent Labs  Lab 03/30/21 2010 04/01/21 1644 04/02/21 0644 04/02/21 1137  GLUCAP 96 128* 103* 123*  -Check CBG twice a day -Start sliding scale if CBG high -Continue Tradjenta -Check hemoglobin A1c in the morning  Hypokalemia/hypophosphatemia/hypomagnesemia -Replenish and recheck.  CKD-3A: Creatinine better than baseline. Recent Labs    10/28/20 1900 03/30/21 2015 03/31/21 0528 04/01/21 0403 04/02/21 0408  BUN 15 18 19 17 16   CREATININE 1.30* 1.22* 1.26* 1.10* 0.97   Normocytic anemia: H&H stable. Recent Labs    10/28/20 1900 03/30/21 2015 03/30/21 2123 03/31/21 0035 03/31/21 0500 03/31/21 0528 04/01/21 0403 04/02/21 0408  HGB 10.4* 9.4* 9.4* 10.9* 9.9* 9.4* 8.8* 9.9*  -Check anemia panel  Heterogenicity of thyroid gland -No follow-up needed given age  Right submandibular sialoliths -swish with lemon juice  Goal of care: 87 years of age with COPD and chronic respiratory failure.  Still full code.  I believe this would pose more harm than benefit.  I have discussed the pros and cons of CPR and intubation with patient's daughter, 99 over the phone who prefers to keep full CODE STATUS for now.  Body mass index is 25.97 kg/m. Nutrition Problem: Increased nutrient needs Etiology: chronic illness (COPD) Signs/Symptoms: estimated needs Interventions: Ensure Enlive (each supplement provides 350kcal and 20 grams of protein),MVI   DVT prophylaxis:  enoxaparin (LOVENOX) injection 30 mg Start: 03/31/21 1400  Code Status: Full code Family Communication: Updated patient's daughters, 04/02/21 over the phone Level of care: Telemetry Medical Status is: Inpatient  Remains inpatient appropriate because:Altered mental status, Ongoing diagnostic testing needed not appropriate for outpatient work up, Unsafe d/c plan and Inpatient level of care appropriate due to severity of illness   Dispo: The patient is from: Home              Anticipated  d/c is to: SNF              Patient currently is not medically stable to d/c.   Difficult to place patient No       Consultants:  None   Sch Meds:  Scheduled Meds: . amLODipine  5 mg Oral Daily  . enoxaparin (LOVENOX) injection  30 mg Subcutaneous Q24H  . feeding supplement  237 mL Oral TID BM  . ferrous sulfate  325 mg Oral Q breakfast  . fluticasone furoate-vilanterol  1 puff Inhalation Daily   And  . umeclidinium bromide  1 puff Inhalation Daily  . guaiFENesin  600 mg Oral BID  . hydrochlorothiazide  12.5 mg Oral Daily  . latanoprost  1 drop Right Eye QHS  . linagliptin  5 mg Oral Daily  . mouth rinse  15 mL Mouth Rinse BID  . metoprolol succinate  50 mg Oral Daily  . mirtazapine  7.5 mg Oral QHS  . multivitamin with minerals  1 tablet Oral Daily  . pantoprazole  40 mg Oral Daily  . predniSONE  40 mg Oral Q breakfast  . rosuvastatin  20 mg Oral QHS  . verapamil  240 mg Oral QHS   Continuous Infusions: . sodium chloride Stopped (  03/31/21 0742)  . albuterol Stopped (03/31/21 0400)  . cefTRIAXone (ROCEPHIN)  IV 1 g (04/02/21 0426)  . potassium PHOSPHATE IVPB (in mmol) 30 mmol (04/02/21 1136)   PRN Meds:.albuterol, guaiFENesin-dextromethorphan, hydrALAZINE  Antimicrobials: Anti-infectives (From admission, onward)   Start     Dose/Rate Route Frequency Ordered Stop   04/01/21 0400  cefTRIAXone (ROCEPHIN) 1 g in sodium chloride 0.9 % 100 mL IVPB        1 g 200 mL/hr over 30 Minutes Intravenous Every 24 hours 03/31/21 0424     03/31/21 0430  cefTRIAXone (ROCEPHIN) 1 g in sodium chloride 0.9 % 100 mL IVPB        1 g 200 mL/hr over 30 Minutes Intravenous  Once 03/31/21 0426 03/31/21 0511       I have personally reviewed the following labs and images: CBC: Recent Labs  Lab 03/30/21 2015 03/30/21 2123 03/31/21 0035 03/31/21 0500 03/31/21 0528 04/01/21 0403 04/02/21 0408  WBC 9.0 8.8  --   --  5.9 10.2 8.0  NEUTROABS  --  5.7  --   --   --   --   --   HGB  9.4* 9.4* 10.9* 9.9* 9.4* 8.8* 9.9*  HCT 31.1* 31.2* 32.0* 29.0* 31.7* 28.3* 31.6*  MCV 90.7 90.2  --   --  91.4 88.2 87.3  PLT 301 302  --   --  144* 327 333   BMP &GFR Recent Labs  Lab 03/30/21 2015 03/31/21 0035 03/31/21 0500 03/31/21 0528 04/01/21 0403 04/02/21 0408  NA 135 138 136 136 139 137  K 4.4 4.3 4.3 4.6 4.1 3.6  CL 92*  --   --  90* 94* 94*  CO2 33*  --   --  34* 34* 33*  GLUCOSE 105*  --   --  147* 124* 116*  BUN 18  --   --  CREATININE 1.22*  --   --  1.26* 1.10* 0.97  CALCIUM 8.8*  --   --  8.7* 9.0 9.2  MG  --   --   --   --   --  1.7  PHOS  --   --   --   --   --  1.6*   Estimated Creatinine Clearance: 32.6 mL/min (by C-G formula based on SCr of 0.97 mg/dL). Liver & Pancreas: Recent Labs  Lab 03/30/21 2015 04/01/21 0403 04/02/21 0408  AST 21 26  --   ALT 11 13  --   ALKPHOS 47 40  --   BILITOT 0.6 0.5  --   PROT 6.8 6.3*  --   ALBUMIN 2.7* 2.5* 2.7*   No results for input(s): LIPASE, AMYLASE in the last 168 hours. Recent Labs  Lab 04/01/21 1358 04/02/21 0408  AMMONIA 47* 24   Diabetic: No results for input(s): HGBA1C in the last 72 hours. Recent Labs  Lab 03/30/21 2010 04/01/21 1644 04/02/21 0644 04/02/21 1137  GLUCAP 96 128* 103* 123*   Cardiac Enzymes: Recent Labs  Lab 04/02/21 0408  CKTOTAL 246*   No results for input(s): PROBNP in the last 8760 hours. Coagulation Profile: Recent Labs  Lab 03/30/21 2018  INR 1.1   Thyroid Function Tests: Recent Labs    04/01/21 1358  TSH 2.085   Lipid Profile: No results for input(s): CHOL, HDL, LDLCALC, TRIG, CHOLHDL, LDLDIRECT in the last 72 hours. Anemia Panel: Recent Labs    04/01/21 1358  VITAMINB12 301  FOLATE 8.7  FERRITIN 94  TIBC 204*  IRON 48  RETICCTPCT 1.1   Urine analysis:    Component Value Date/Time   COLORURINE YELLOW 03/30/2021 2350   APPEARANCEUR CLEAR 03/30/2021 2350   LABSPEC 1.016 03/30/2021 2350   PHURINE 5.0 03/30/2021 2350   GLUCOSEU  NEGATIVE 03/30/2021 2350   HGBUR SMALL (A) 03/30/2021 2350   BILIRUBINUR NEGATIVE 03/30/2021 2350   KETONESUR NEGATIVE 03/30/2021 2350   PROTEINUR NEGATIVE 03/30/2021 2350   NITRITE NEGATIVE 03/30/2021 2350   LEUKOCYTESUR NEGATIVE 03/30/2021 2350   Sepsis Labs: Invalid input(s): PROCALCITONIN, LACTICIDVEN  Microbiology: Recent Results (from the past 240 hour(s))  Resp Panel by RT-PCR (Flu A&B, Covid) Nasopharyngeal Swab     Status: None   Collection Time: 03/30/21  8:18 PM   Specimen: Nasopharyngeal Swab; Nasopharyngeal(NP) swabs in vial transport medium  Result Value Ref Range Status   SARS Coronavirus 2 by RT PCR NEGATIVE NEGATIVE Final    Comment: (NOTE) SARS-CoV-2 target nucleic acids are NOT DETECTED.  The SARS-CoV-2 RNA is generally detectable in upper respiratory specimens during the acute phase of infection. The lowest concentration of SARS-CoV-2 viral copies this assay can detect is 138 copies/mL. A negative result does not preclude SARS-Cov-2 infection and should not be used as the sole basis for treatment or other patient management decisions. A negative result may occur with  improper specimen collection/handling, submission of specimen other than nasopharyngeal swab, presence of viral mutation(s) within the areas targeted by this assay, and inadequate number of viral copies(<138 copies/mL). A negative result must be combined with clinical observations, patient history, and epidemiological information. The expected result is Negative.  Fact Sheet for Patients:  BloggerCourse.com  Fact Sheet for Healthcare Providers:  SeriousBroker.it  This test is no t yet approved or cleared by the [place] FDA and  has been authorized for detection and/or diagnosis of SARS-CoV-2 by FDA under an Emergency Use Authorization (EUA). This EUA will remain  in effect (meaning this test can be used) for the duration of  the COVID-19 declaration under Section 564(b)(1) of the Act, 21 U.S.C.section 360bbb-3(b)(1), unless the authorization is terminated  or revoked sooner.       Influenza A by PCR NEGATIVE NEGATIVE Final   Influenza B by PCR NEGATIVE NEGATIVE Final    Comment: (NOTE) The Xpert Xpress SARS-CoV-2/FLU/RSV plus assay is intended as an aid in the diagnosis of influenza from Nasopharyngeal swab specimens and should not be used as a sole basis for treatment. Nasal washings and aspirates are unacceptable for Xpert Xpress SARS-CoV-2/FLU/RSV testing.  Fact Sheet for Patients: BloggerCourse.com  Fact Sheet for Healthcare Providers: SeriousBroker.it  This test is not yet approved or cleared by the [place] FDA and has been authorized for detection and/or diagnosis of SARS-CoV-2 by FDA under an Emergency Use Authorization (EUA). This EUA will remain in effect (meaning this test can be used) for the duration of the COVID-19 declaration under Section 564(b)(1) of the Act, 21 U.S.C. section 360bbb-3(b)(1), unless the authorization is terminated or revoked.  Performed at [facility], [address] [place], [place] [address]     Radiology Studies: DG Knee 3 Views Left  Result Date: 04/01/2021 CLINICAL DATA:  Knee pain EXAM: LEFT KNEE - 3 VIEW COMPARISON:  None. FINDINGS: Degenerative changes are noted most marked in the medial joint space. No acute fracture or dislocation is noted. No joint effusion is seen. No soft tissue abnormality is noted IMPRESSION: Degenerative change without acute abnormality. Electronically Signed   By: Loraine Leriche  Lukens M.D.   On: 04/01/2021 17:46   DG KNEE 3 VIEW RIGHT  Result Date: 04/01/2021 CLINICAL DATA:  Right knee pain, no known injury, initial encounter EXAM: RIGHT KNEE - 3 VIEW COMPARISON:  06/15/2016 FINDINGS: Tricompartmental degenerative changes are noted. No acute fracture or dislocation is seen. No  joint effusion is noted. IMPRESSION: Degenerative change without acute abnormality. Electronically Signed   By: Alcide Clever M.D.   On: 04/01/2021 17:43      Teddie Curd T. Balian Schaller [facility]  If 7PM-7AM, please contact night-coverage [email or web address] 04/02/2021, 2:53 PM

## 2021-04-02 NOTE — Progress Notes (Signed)
SLP Cancellation Note  Patient Details Name: Sheri [name] MRN: [id number] DOB: 30-Mar-1926   Cancelled treatment:       Reason Eval/Treat Not Completed: Other (comment) (Pt's case was discussed with pt's MD who indicated that the pt will have MRI with sedation later today. It was therefore agreed that the MBS will be deferred until tomorrow due to the necessary sedation for the MRI.)  Corderius Saraceni I. Vear Clock, MS, CCC-SLP Acute Rehabilitation Services Office number [phone] Pager [phone]  [name] 04/02/2021, 2:20 PM

## 2021-04-02 NOTE — Progress Notes (Signed)
  Speech Language Pathology Treatment: Dysphagia  Patient Details Name: Sheri [name] MRN: [id number] DOB: 03/03/1926 Today's Date: 04/02/2021 Time: 3151-7616 SLP Time Calculation (min) (ACUTE ONLY): 17 min  Assessment / Plan / Recommendation Clinical Impression  Pt was seen for dysphagia treatment with one of her daughters present. Pt's daughter reported that the pt consumed limited bites and sips at lunch. Pt's daughter and [place], RN denied the pt demonstrating any signs of aspiration with p.o. intake. Pt's RN indicated that pt consumed one pill whole with thin liquids which could not be crushed and no s/sx of aspiration were observed. Pt tolerated regular texture solids and consecutive swallows of thin liquids without overt s/sx of aspiration. Mastication was prolonged with adequate oral clearance. Pt continues to demonstrate eructation and grimacing was intermittently noted. Pt's MRI under general anesthesia is scheduled for 6/3 at 1115. Considering pt's need to be NPO prior to the MRI, potential lethargy after, and radiology's schedule, SLP anticipates that a modified barium swallow study will not be able to be coordinated for 6/3. However, if pt's presentation today is closer to her baseline than that noted yesterday, SLP would recommend that esophageal assessment be prioritized over the MBS. SLP will continue to follow pt.    HPI HPI: Pt is a 87 year old woman admitted on 03/31/21 with AMS, COPD exacerbation  and falls. Pt injured her L ankle/foot and hit her head in a fall. Head CT negative, CXR on admission negative. Esophageal dysphagia reported by pt's daughter to referring MD. Esophagram 08/06/19: Esophageal dysmotility, likely presbyesophagus. Small hiatal hernia with mild stricture as evidenced by underdistention at the gastroesophageal junction. PMH: COPD on 3L at home, t11 compression fx, HTN, AAA.      SLP Plan  Continue with current plan of care       Recommendations  Diet  recommendations: Regular;Thin liquid Liquids provided via: Cup;Straw Medication Administration: Crushed with puree (meds which cannot be crushed may be given whole in puree) Supervision: Staff to assist with self feeding Compensations: Slow rate;Small sips/bites;Follow solids with liquid                Oral Care Recommendations: Oral care BID Follow up Recommendations:  (TBD) SLP Visit Diagnosis: Dysphagia, unspecified (R13.10) Plan: Continue with current plan of care       Shaunte Tuft I. Vear Clock, MS, CCC-SLP Acute Rehabilitation Services Office number [phone] Pager [phone]                [name] 04/02/2021, 4:44 PM

## 2021-04-03 ENCOUNTER — Inpatient Hospital Stay (HOSPITAL_COMMUNITY): Payer: Medicare Other | Admitting: Registered Nurse

## 2021-04-03 ENCOUNTER — Inpatient Hospital Stay (HOSPITAL_COMMUNITY): Payer: Medicare Other

## 2021-04-03 ENCOUNTER — Encounter (HOSPITAL_COMMUNITY)
Admission: EM | Disposition: A | Discharge status: Home Health | Payer: Self-pay | Source: Home / Self Care | Attending: Family Medicine

## 2021-04-03 ENCOUNTER — Encounter (HOSPITAL_COMMUNITY): Payer: Self-pay | Admitting: Family Medicine

## 2021-04-03 LAB — RENAL FUNCTION PANEL
Albumin: 2.6 g/dL — ABNORMAL LOW (ref 3.5–5.0)
Anion gap: 7 (ref 5–15)
BUN: 10 mg/dL (ref 8–23)
CO2: 34 mmol/L — ABNORMAL HIGH (ref 22–32)
Calcium: 8.5 mg/dL — ABNORMAL LOW (ref 8.9–10.3)
Chloride: 94 mmol/L — ABNORMAL LOW (ref 98–111)
Creatinine, Ser: 0.91 mg/dL (ref 0.44–1.00)
GFR, Estimated: 59 mL/min — ABNORMAL LOW (ref >60)
Glucose, Bld: 116 mg/dL — ABNORMAL HIGH (ref 70–99)
Phosphorus: 2.5 mg/dL (ref 2.5–4.6)
Potassium: 4.2 mmol/L (ref 3.5–5.1)
Sodium: 135 mmol/L (ref 135–145)

## 2021-04-03 LAB — GLUCOSE, CAPILLARY
Glucose-Capillary: 129 mg/dL — ABNORMAL HIGH (ref 70–99)
Glucose-Capillary: 123 mg/dL — ABNORMAL HIGH (ref 70–99)

## 2021-04-03 LAB — CK: Total CK: 105 U/L (ref 38–234)

## 2021-04-03 LAB — MAGNESIUM: Magnesium: 2 mg/dL (ref 1.7–2.4)

## 2021-04-03 LAB — SURGICAL PCR SCREEN
MRSA, PCR: NEGATIVE
Staphylococcus aureus: NEGATIVE

## 2021-04-03 LAB — T.PALLIDUM AB, TOTAL: T Pallidum Abs: REACTIVE — AB

## 2021-04-03 LAB — HEMOGLOBIN A1C
Hgb A1c MFr Bld: 5.9 % — ABNORMAL HIGH (ref 4.8–5.6)
Mean Plasma Glucose: 123 mg/dL

## 2021-04-03 SURGERY — MRI WITH ANESTHESIA
Anesthesia: General

## 2021-04-03 MED ORDER — PREDNISONE 20 MG PO TABS
20.0000 mg | ORAL_TABLET | Freq: Every day | ORAL | Status: AC
  Administered 2021-04-04: 20 mg via ORAL
  Filled 2021-04-03: qty 1

## 2021-04-03 MED ORDER — LACTATED RINGERS IV SOLN: INTRAVENOUS | Status: DC

## 2021-04-03 MED ORDER — ORAL CARE MOUTH RINSE: 15.0000 mL | Freq: Once | OROMUCOSAL | Status: AC

## 2021-04-03 MED ORDER — METOPROLOL SUCCINATE ER 25 MG PO TB24
ORAL_TABLET | ORAL | Status: AC
  Filled 2021-04-03: qty 2

## 2021-04-03 MED ORDER — CHLORHEXIDINE GLUCONATE 0.12 % MT SOLN
OROMUCOSAL | Status: AC
  Administered 2021-04-03: 15 mL via OROMUCOSAL
  Filled 2021-04-03: qty 15

## 2021-04-03 MED ORDER — ENOXAPARIN SODIUM 40 MG/0.4ML IJ SOSY
40.0000 mg | PREFILLED_SYRINGE | INTRAMUSCULAR | Status: DC
  Administered 2021-04-03 – 2021-04-07 (×5): 40 mg via SUBCUTANEOUS
  Filled 2021-04-03 (×5): qty 0.4

## 2021-04-03 MED ORDER — CHLORHEXIDINE GLUCONATE 0.12 % MT SOLN: 15.0000 mL | Freq: Once | OROMUCOSAL | Status: AC

## 2021-04-03 NOTE — Progress Notes (Signed)
SATURATION QUALIFICATIONS: (This note is used to comply with regulatory documentation for home oxygen)  Patient Saturations on Room Air at Rest = 92%  Patient Saturations on Room Air while Ambulating = 85%  Patient Saturations on 2 Liters of oxygen while Ambulating = 92%  Please briefly explain why patient needs home oxygen: Pt is unable to ambulate any distance however with transfer to chair SaO2 dropped to 85%O2 and did not rebound with purse lip breathing for 1 min. Pt placed on 2L O2 via [place] for subsequent sit<>stand from recliner and was able maintain SaO2 92%O2.   Abeni Finchum B. Beverely Risen PT, DPT Acute Rehabilitation Services Pager [phone] Office [phone]

## 2021-04-03 NOTE — Anesthesia Preprocedure Evaluation (Deleted)
Anesthesia Evaluation  Patient identified by MRN, date of birth, ID band  Reviewed: Allergy & Precautions, NPO status , Patient's Chart, lab work & pertinent test results  History of Anesthesia Complications Negative for: history of anesthetic complications  Airway        Dental   Pulmonary COPD, former smoker,  Covid-19 Nucleic Acid Test Results Lab Results      Component                Value               Date                      SARSCOV2NAA              NEGATIVE            03/30/2021                     Cardiovascular hypertension,   Left ventricle: The cavity size was normal. Wall thickness was  increased in a pattern of mild LVH. Systolic function was normal.  The estimated ejection fraction was in the range of 55% to 60%.  Wall motion was normal; there were no regional wall motion  abnormalities. Doppler parameters are consistent with abnormal  left ventricular relaxation (grade 1 diastolic dysfunction).  Doppler parameters are consistent with high ventricular filling  pressure.  - Aortic valve: Valve mobility was restricted. There was trivial  regurgitation.  - Pulmonary arteries: Systolic pressure was mildly increased. PA  peak pressure: 34 mm Hg (S).     Neuro/Psych negative neurological ROS  negative psych ROS   GI/Hepatic   Endo/Other  negative endocrine ROSNo results found for: HGBA1C   Renal/GU Renal disease     Musculoskeletal   Abdominal   Peds  Hematology  (+) anemia , Lab Results      Component                Value               Date                      WBC                      8.0                 04/02/2021                HGB                      9.9 (L)             04/02/2021                HCT                      31.6 (L)            04/02/2021                MCV                      87.3                04/02/2021                PLT  333                  04/02/2021              Anesthesia Other Findings   Reproductive/Obstetrics                             Anesthesia Physical Anesthesia Plan Anesthesia Quick Evaluation

## 2021-04-03 NOTE — Care Management Important Message (Signed)
Important Message  Patient Details  Name: Sheri [name] MRN: [id number] Date of Birth: [birth year]   Medicare Important Message Given:  Yes - Important Message mailed due to current National Emergency   Verbal consent obtained due to current National Emergency  Relationship to patient: Self Contact Name: Nevah Call Date: 04/03/21  Time: 1124 Phone: [phone] Outcome: No Answer/Busy Important Message mailed to: Patient address on file    Orson Aloe 04/03/2021, 11:24 AM

## 2021-04-03 NOTE — Progress Notes (Signed)
Dr. Alanda Slim (attending MD) paged for Dr. Maple  (anesthesia) in regards to scheduled MRI under anesthesia.

## 2021-04-03 NOTE — Progress Notes (Signed)
PROGRESS NOTE  BO ROGUE MBW:[id number] DOB: February 18, 1926   PCP: Renaye Rakers, MD  Patient is from: Home.  Uses walker at baseline.  DOA: 03/30/2021 LOS: 3  Chief complaints: Altered mental status  Brief Narrative / Interim history: 87 year old F with PMH of COPD and chronic RF on 3 L, HTN, DM-2, T11 compression fracture and osteoarthritis presenting with altered mental status for 2 days.  Patient has had a fall 4 days prior to admission.  Per patient's daughter, patient had recurrent falls over the last 4 months with leg giving out.  No history of dementia.   In ED, VBG with acute on chronic respiratory acidosis with hypercapnia.CT head and cervical spine, DG left foot, DG left ankle, b/l knees, thoracic/lumbar spine without acute finding. Patient was admitted for acute metabolic encephalopathy likely from hypercapnia in the setting of COPD exacerbation.  Started on BiPAP and COPD pathway. MRI brain ordered but patient could not tolerate.  Anesthesia did not feel MRI under anesthesia is the safest thing to do at this time given his COPD exacerbation.  Patient's mental status improved as well.  MRI counseled.  Subjective: Seen and examined earlier this afternoon.  No major events overnight or this morning.  No complaints.  She feels better.  She denies pain, shortness of breath, nausea, vomiting, difficulty swallowing... [name] at bedside.  Objective: Vitals:   04/03/21 0909 04/03/21 0915 04/03/21 0934 04/03/21 1028  BP: (!) 185/108  (!) 151/67 (!) 120/94  Pulse:   72 68  Resp:   (!) 40 16  Temp:    97.8 F (36.6 C)  TempSrc:      SpO2:   94% 92%  Weight:  64.4 kg    Height:  5\' 2"  (1.575 m)      Intake/Output Summary (Last 24 hours) at 04/03/2021 1439 Last data filed at 04/03/2021 1203 Gross per 24 hour  Intake 849.29 ml  Output 3400 ml  Net -2550.71 ml   Filed Weights   03/30/21 1957 04/03/21 0915  Weight: 64.4 kg 64.4 kg    Examination:  GENERAL: No  apparent distress.  Nontoxic. HEENT: MMM.  Hearing grossly intact.  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 NECK: Supple.  No apparent JVD.  RESP: 97% on 0.5 L.  No IWOB.  Fair aeration bilaterally. CVS:  RRR. Heart sounds normal.  ABD/GI/GU: BS+. Abd soft, NTND.  MSK/EXT:  Moves extremities.  Significant muscle mass and subcu fat loss. SKIN: no apparent skin lesion or wound NEURO: Awake and alert. Oriented to self, place and person.  No apparent focal neuro deficit. PSYCH: Calm. Normal affect.   Procedures:  None  Microbiology summarized: COVID-19 and influenza PCR nonreactive.  Assessment & Plan: Acute metabolic encephalopathy-likely due to hypercapnia.  Not on sedating meds.  No signs of infection.  No focal neurodeficit to suggest CVA.  CT head without acute finding but cerebral atrophy with chronic microvascular changes.  Basic encephalopathy work-up unrevealing except for mildly elevated ammonia.  Encephalopathy improving. -Manage COPD as below -Avoid or minimize sedating medications -PT/OT/SLP eval and treatment -Reorientation and delirium precautions  Acute on chronic RF with hypercapnia due to COPD exacerbation: Initial VBG with acute on chronic respiratory acidosis and hypercapnia to 88.  Improved. -COPD pathway with systemic steroid, antibiotics and nebulizers/inhalers -Wean oxygen to room air.  Goal saturation 88% and above with minimum oxygen!  Recurrent falls-reportedly knees giving out. Uses walker at baseline.  Multiple x-rays without acute finding.  Only arthritis. -Voltaren gel and scheduled  Tylenol -PT/OT -Fall precautions  Essential hypertension/pulmonary hypertension: Followed by [facility] cardiology team. -Continue home Cardizem, metoprolol and HCTZ.   Esophageal dysphagia? seems to be doing well from this.  She had a esophageal stretching in the past -Regular diet per SLP.  Chronic T11 compression fracture: slightly progressed to about 50% height loss but no retropulsion.   She has no back pain or neurodeficit.  Controlled NIDDM-2 with mild hyperglycemia: CBG within fair range despite steroid. Recent Labs  Lab 04/02/21 1137 04/02/21 1558 04/02/21 2132 04/03/21 0639 04/03/21 1142  GLUCAP 123* 186* 168* 129* 123*  -Check CBG twice a day -Start sliding scale if CBG high -Continue Tradjenta -Follow hemoglobin A1c  Hypokalemia/hypophosphatemia/hypomagnesemia: Resolved.  CKD-3A: Creatinine better than baseline. Recent Labs    10/28/20 1900 03/30/21 2015 03/31/21 0528 04/01/21 0403 04/02/21 0408 04/03/21 0647  BUN CREATININE 1.30* 1.22* 1.26* 1.10* 0.97 0.91   Normocytic anemia: H&H stable.  Anemia panel basically normal. Recent Labs    10/28/20 1900 03/30/21 2015 03/30/21 2123 03/31/21 0035 03/31/21 0500 03/31/21 0528 04/01/21 0403 04/02/21 0408  HGB 10.4* 9.4* 9.4* 10.9* 9.9* 9.4* 8.8* 9.9*    Heterogenicity of thyroid gland -No follow-up needed given age  Right submandibular sialoliths -swish with lemon juice  Goal of care: 87 years of age with COPD and chronic respiratory failure.  Still full code.  I believe this would pose more harm than benefit.  I have discussed the pros and cons of CPR and intubation with patient's daughter, [name] over the phone who prefers to keep full CODE STATUS for now.  Body mass index is 25.97 kg/m. Nutrition Problem: Increased nutrient needs Etiology: chronic illness (COPD) Signs/Symptoms: estimated needs Interventions: Ensure Enlive (each supplement provides 350kcal and 20 grams of protein),MVI   DVT prophylaxis:  enoxaparin (LOVENOX) injection 40 mg Start: 04/03/21 1430  Code Status: Full code Family Communication: Updated patient's daughters, [name] over the phone.  Also updated other daughter at bedside. Level of care: Med-Surg Status is: Inpatient  Remains inpatient appropriate because:Altered mental status, Unsafe d/c plan and Inpatient level of care appropriate  due to severity of illness   Dispo: The patient is from: Home              Anticipated d/c is to: Home on 6/4 with home health and DME              Patient currently is not medically stable to d/c.   Difficult to place patient No       Consultants:  None   Sch Meds:  Scheduled Meds: . acetaminophen  500 mg Oral Q8H  . diclofenac Sodium  2 g Topical QID  . enoxaparin (LOVENOX) injection  40 mg Subcutaneous Q24H  . feeding supplement  237 mL Oral TID BM  . ferrous sulfate  325 mg Oral Q breakfast  . fluticasone furoate-vilanterol  1 puff Inhalation Daily   And  . umeclidinium bromide  1 puff Inhalation Daily  . guaiFENesin  600 mg Oral BID  . hydrochlorothiazide  12.5 mg Oral Daily  . latanoprost  1 drop Right Eye QHS  . linagliptin  5 mg Oral Daily  . mouth rinse  15 mL Mouth Rinse BID  . metoprolol succinate  50 mg Oral Daily  . metoprolol succinate      . mirtazapine  7.5 mg Oral QHS  . multivitamin with minerals  1 tablet Oral Daily  . pantoprazole  40 mg Oral  Daily  . [START ON 04/04/2021] predniSONE  20 mg Oral Q breakfast  . rosuvastatin  20 mg Oral QHS  . verapamil  240 mg Oral QHS   Continuous Infusions: . albuterol Stopped (03/31/21 0400)  . cefTRIAXone (ROCEPHIN)  IV 1 g (04/03/21 0314)   PRN Meds:.albuterol, guaiFENesin-dextromethorphan, hydrALAZINE  Antimicrobials: Anti-infectives (From admission, onward)   Start     Dose/Rate Route Frequency Ordered Stop   04/01/21 0400  cefTRIAXone (ROCEPHIN) 1 g in sodium chloride 0.9 % 100 mL IVPB        1 g 200 mL/hr over 30 Minutes Intravenous Every 24 hours 03/31/21 0424     03/31/21 0430  cefTRIAXone (ROCEPHIN) 1 g in sodium chloride 0.9 % 100 mL IVPB        1 g 200 mL/hr over 30 Minutes Intravenous  Once 03/31/21 0426 03/31/21 0511       I have personally reviewed the following labs and images: CBC: Recent Labs  Lab 03/30/21 2015 03/30/21 2123 03/31/21 0035 03/31/21 0500 03/31/21 0528  04/01/21 0403 04/02/21 0408  WBC 9.0 8.8  --   --  5.9 10.2 8.0  NEUTROABS  --  5.7  --   --   --   --   --   HGB 9.4* 9.4* 10.9* 9.9* 9.4* 8.8* 9.9*  HCT 31.1* 31.2* 32.0* 29.0* 31.7* 28.3* 31.6*  MCV 90.7 90.2  --   --  91.4 88.2 87.3  PLT 301 302  --   --  144* 327 333   BMP &GFR Recent Labs  Lab 03/30/21 2015 03/31/21 0035 03/31/21 0500 03/31/21 0528 04/01/21 0403 04/02/21 0408 04/03/21 0647  NA 135   < > 136 136 139 137 135  K 4.4   < > 4.3 4.6 4.1 3.6 4.2  CL 92*  --   --  90* 94* 94* 94*  CO2 33*  --   --  34* 34* 33* 34*  GLUCOSE 105*  --   --  147* 124* 116* 116*  BUN 18  --   --  19 17 16 10   CREATININE 1.22*  --   --  1.26* 1.10* 0.97 0.91  CALCIUM 8.8*  --   --  8.7* 9.0 9.2 8.5*  MG  --   --   --   --   --  1.7 2.0  PHOS  --   --   --   --   --  1.6* 2.5   < > = values in this interval not displayed.   Estimated Creatinine Clearance: 34.7 mL/min (by C-G formula based on SCr of 0.91 mg/dL). Liver & Pancreas: Recent Labs  Lab 03/30/21 2015 04/01/21 0403 04/02/21 0408 04/03/21 0647  AST 21 26  --   --   ALT 11 13  --   --   ALKPHOS 47 40  --   --   BILITOT 0.6 0.5  --   --   PROT 6.8 6.3*  --   --   ALBUMIN 2.7* 2.5* 2.7* 2.6*   No results for input(s): LIPASE, AMYLASE in the last 168 hours. Recent Labs  Lab 04/01/21 1358 04/02/21 0408  AMMONIA 47* 24   Diabetic: No results for input(s): HGBA1C in the last 72 hours. Recent Labs  Lab 04/02/21 1137 04/02/21 1558 04/02/21 2132 04/03/21 0639 04/03/21 1142  GLUCAP 123* 186* 168* 129* 123*   Cardiac Enzymes: Recent Labs  Lab 04/02/21 0408 04/03/21 0647  CKTOTAL 246* 105   No  results for input(s): PROBNP in the last 8760 hours. Coagulation Profile: Recent Labs  Lab 03/30/21 2018  INR 1.1   Thyroid Function Tests: Recent Labs    04/01/21 1358  TSH 2.085   Lipid Profile: No results for input(s): CHOL, HDL, LDLCALC, TRIG, CHOLHDL, LDLDIRECT in the last 72 hours. Anemia  Panel: Recent Labs    04/01/21 1358  VITAMINB12 301  FOLATE 8.7  FERRITIN 94  TIBC 204*  IRON 48  RETICCTPCT 1.1   Urine analysis:    Component Value Date/Time   COLORURINE YELLOW 03/30/2021 2350   APPEARANCEUR CLEAR 03/30/2021 2350   LABSPEC 1.016 03/30/2021 2350   PHURINE 5.0 03/30/2021 2350   GLUCOSEU NEGATIVE 03/30/2021 2350   HGBUR SMALL (A) 03/30/2021 2350   BILIRUBINUR NEGATIVE 03/30/2021 2350   KETONESUR NEGATIVE 03/30/2021 2350   PROTEINUR NEGATIVE 03/30/2021 2350   NITRITE NEGATIVE 03/30/2021 2350   LEUKOCYTESUR NEGATIVE 03/30/2021 2350   Sepsis Labs: Invalid input(s): PROCALCITONIN, LACTICIDVEN  Microbiology: Recent Results (from the past 240 hour(s))  Resp Panel by RT-PCR (Flu A&B, Covid) Nasopharyngeal Swab     Status: None   Collection Time: 03/30/21  8:18 PM   Specimen: Nasopharyngeal Swab; Nasopharyngeal(NP) swabs in vial transport medium  Result Value Ref Range Status   SARS Coronavirus 2 by RT PCR NEGATIVE NEGATIVE Final    Comment: (NOTE) SARS-CoV-2 target nucleic acids are NOT DETECTED.  The SARS-CoV-2 RNA is generally detectable in upper respiratory specimens during the acute phase of infection. The lowest concentration of SARS-CoV-2 viral copies this assay can detect is 138 copies/mL. A negative result does not preclude SARS-Cov-2 infection and should not be used as the sole basis for treatment or other patient management decisions. A negative result may occur with  improper specimen collection/handling, submission of specimen other than nasopharyngeal swab, presence of viral mutation(s) within the areas targeted by this assay, and inadequate number of viral copies(<138 copies/mL). A negative result must be combined with clinical observations, patient history, and epidemiological information. The expected result is Negative.  Fact Sheet for Patients:  BloggerCourse.com  Fact Sheet for Healthcare Providers:   SeriousBroker.it  This test is no t yet approved or cleared by the [place] FDA and  has been authorized for detection and/or diagnosis of SARS-CoV-2 by FDA under an Emergency Use Authorization (EUA). This EUA will remain  in effect (meaning this test can be used) for the duration of the COVID-19 declaration under Section 564(b)(1) of the Act, 21 U.S.C.section 360bbb-3(b)(1), unless the authorization is terminated  or revoked sooner.       Influenza A by PCR NEGATIVE NEGATIVE Final   Influenza B by PCR NEGATIVE NEGATIVE Final    Comment: (NOTE) The Xpert Xpress SARS-CoV-2/FLU/RSV plus assay is intended as an aid in the diagnosis of influenza from Nasopharyngeal swab specimens and should not be used as a sole basis for treatment. Nasal washings and aspirates are unacceptable for Xpert Xpress SARS-CoV-2/FLU/RSV testing.  Fact Sheet for Patients: BloggerCourse.com  Fact Sheet for Healthcare Providers: SeriousBroker.it  This test is not yet approved or cleared by the [place] FDA and has been authorized for detection and/or diagnosis of SARS-CoV-2 by FDA under an Emergency Use Authorization (EUA). This EUA will remain in effect (meaning this test can be used) for the duration of the COVID-19 declaration under Section 564(b)(1) of the Act, 21 U.S.C. section 360bbb-3(b)(1), unless the authorization is terminated or revoked.  Performed at [facility], [address], [place], [place]  [id number]   Surgical pcr screen     Status: None   Collection Time: 04/02/21 11:30 PM   Specimen: Nasal Mucosa; Nasal Swab  Result Value Ref Range Status   MRSA, PCR NEGATIVE NEGATIVE Final   Staphylococcus aureus NEGATIVE NEGATIVE Final    Comment: (NOTE) The Xpert SA Assay (FDA approved for NASAL specimens in patients 85 years of age and older), is one component of a comprehensive surveillance program.  It is not intended to diagnose infection nor to guide or monitor treatment. Performed at [facility], [address] [place], [place] [id number]     Radiology Studies: No results found.    Brylan Seubert T. Lavena Loretto [facility]  If 7PM-7AM, please contact night-coverage [email or web address] 04/03/2021, 2:39 PM

## 2021-04-03 NOTE — Progress Notes (Signed)
Physical Therapy Treatment Patient Details Name: Sheri [name] MRN: [id number] DOB: 1926-01-12 Today's Date: 04/03/2021    History of Present Illness Pt is a 87 year old woman admitted on 03/31/21 with AMS, COPD exacerbation  and falls. Pt injured her L ankle/foot and hit her head in a fall. Head CT negative, MRI pending. PMH: COPD on 3L at home, t11 compression fx, HTN, AAA.    PT Comments    Met MD in room, requesting ambulation O2 saturation. Pt with improved mentation but continues to have poor safety awareness and command follow. Pt modAx2 for bed mobility. maxAx2 for and transfers and modAx2 for ambulation. Pt SaO2 drops to 85%O2 with activity, requires 2L O2 via [place] for activity to maintain SaO2 92%O2. Able to maintain SaO2 >89%O2 on RA with static sitting. D/c plans remain appropriate at this time. PT will continue to follow acutely.   Follow Up Recommendations  SNF     Equipment Recommendations  None recommended by PT    Recommendations for Other Services       Precautions / Restrictions Precautions Precautions: Fall Precaution Comments: frequent falls at home (2-3x week) Restrictions Weight Bearing Restrictions: No    Mobility  Bed Mobility Overal bed mobility: Needs Assistance Bed Mobility: Supine to Sit;Sit to Supine     Supine to sit: Mod assist;+2 for physical assistance     General bed mobility comments: pt able to initiate LE movement to EoB, and reach for bed rail however ultimately requires modAx2 for coming to the EoB    Transfers Overall transfer level: Needs assistance   Transfers: Sit to/from Stand Sit to Stand: +2 physical assistance;Max assist         General transfer comment: maxAx2 for standing from bed and recliner, maximal cuing for hand placement however ultimately pulls up on RW to achieve upright  Ambulation/Gait Ambulation/Gait assistance: Mod assist;+2 physical assistance Gait Distance (Feet): 2 Feet Assistive device: Rolling  walker (2 wheeled) Gait Pattern/deviations: Step-to pattern;Decreased step length - right;Decreased step length - left;Trunk flexed Gait velocity: slowed Gait velocity interpretation: <1.31 ft/sec, indicative of household ambulator General Gait Details: modAx2 for lateral stepping with RW to recliner         Balance Overall balance assessment: Needs assistance   Sitting balance-Leahy Scale: Poor   Postural control: Posterior lean Standing balance support: Bilateral upper extremity supported Standing balance-Leahy Scale: Zero                              Cognition Arousal/Alertness: Awake/alert Behavior During Therapy: Flat affect Overall Cognitive Status: Impaired/Different from baseline Area of Impairment: Attention;Memory;Following commands;Safety/judgement;Awareness;Problem solving                   Current Attention Level: Divided Memory: Decreased short-term memory Following Commands: Follows one step commands inconsistently Safety/Judgement: Decreased awareness of safety;Decreased awareness of deficits Awareness: Intellectual Problem Solving: Slow processing;Decreased initiation;Difficulty sequencing;Requires verbal cues;Requires tactile cues General Comments: pt with improved mentation, orientedx4 however continues to lack command follow and has impaired sequencing      Exercises      General Comments General comments (skin integrity, edema, etc.): Pt with dizziness with sitting on EOB BP 144/76 Pt on RA on entry with SaO2 90%O2, with transfer dropped to 85%O2, did not rebound with purse lip breathing, with 2L O2 via  SaO2 rebounded to 92%O2 and was able to maintain SaO2 with multiple sit<>stand for cleaning with incontinence, once  settled in recliner returned to RA with SaO2 89%O2.      Pertinent Vitals/Pain Pain Assessment: Faces Faces Pain Scale: Hurts a little bit Pain Location: generalized with mobility Pain Descriptors / Indicators:  Grimacing;Guarding;Discomfort Pain Intervention(s): Limited activity within patient's tolerance;Monitored during session;Repositioned           PT Goals (current goals can now be found in the care plan section) Acute Rehab PT Goals Patient Stated Goal: to get up PT Goal Formulation: Patient unable to participate in goal setting Time For Goal Achievement: 04/15/21 Potential to Achieve Goals: Fair Progress towards PT goals: Progressing toward goals    Frequency    Min 2X/week      PT Plan Current plan remains appropriate       AM-PAC PT "6 Clicks" Mobility   Outcome Measure  Help needed turning from your back to your side while in a flat bed without using bedrails?: A Little Help needed moving from lying on your back to sitting on the side of a flat bed without using bedrails?: A Lot Help needed moving to and from a bed to a chair (including a wheelchair)?: A Lot Help needed standing up from a chair using your arms (e.g., wheelchair or bedside chair)?: Total Help needed to walk in hospital room?: Total Help needed climbing 3-5 steps with a railing? : Total 6 Click Score: 10    End of Session Equipment Utilized During Treatment: Oxygen Activity Tolerance: Patient limited by pain Patient left: in bed;with call bell/phone within reach;with bed alarm set;with family/visitor present Nurse Communication: Mobility status;Other (comment) (pt picking at IV) PT Visit Diagnosis: Unsteadiness on feet (R26.81);Other abnormalities of gait and mobility (R26.89);Repeated falls (R29.6);Muscle weakness (generalized) (M62.81);Difficulty in walking, not elsewhere classified (R26.2);History of falling (Z91.81);Pain Pain - Right/Left: Left Pain - part of body: Ankle and joints of foot     Time: 2952-8413 PT Time Calculation (min) (ACUTE ONLY): 36 min  Charges:  $Therapeutic Activity: 23-37 mins                     Manolito Jurewicz B. Migdalia Dk PT, DPT Acute Rehabilitation Services Pager  [phone] Office [phone]    [place] 04/03/2021, 1:45 PM

## 2021-04-03 NOTE — Progress Notes (Signed)
Dr. Alanda Slim returned page, spoke with Dr. Maple . MRI cancelled at this time. Called report to [place], Architect. Patient transferred back to room at this time.

## 2021-04-03 NOTE — Progress Notes (Signed)
PT Cancellation Note  Patient Details Name: Sheri [name] MRN: [id number] DOB: 11/05/1925   Cancelled Treatment:    Reason Eval/Treat Not Completed: (P) Patient at procedure or test/unavailable Pt is off floor for MRI. PT will follow back as able this afternoon.  Ishana Blades B. Beverely Risen PT, DPT Acute Rehabilitation Services Pager [phone] Office [phone]    [place] [employer] [facility] 04/03/2021, 8:46 AM

## 2021-04-03 NOTE — Progress Notes (Signed)
Pt off the unit.

## 2021-04-04 LAB — GLUCOSE, CAPILLARY
Glucose-Capillary: 108 mg/dL — ABNORMAL HIGH (ref 70–99)
Glucose-Capillary: 148 mg/dL — ABNORMAL HIGH (ref 70–99)

## 2021-04-04 MED ORDER — BREZTRI AEROSPHERE 160-9-4.8 MCG/ACT IN AERO: 1.0000 | INHALATION_SPRAY | Freq: Every day | RESPIRATORY_TRACT | 1 refills | Status: AC

## 2021-04-04 MED ORDER — HYDRALAZINE HCL 25 MG PO TABS
25.0000 mg | ORAL_TABLET | Freq: Three times a day (TID) | ORAL | Status: DC
  Administered 2021-04-04 – 2021-04-21 (×41): 25 mg via ORAL
  Filled 2021-04-04 (×42): qty 1

## 2021-04-04 MED ORDER — COMBIVENT RESPIMAT 20-100 MCG/ACT IN AERS: 1.0000 | INHALATION_SPRAY | Freq: Four times a day (QID) | RESPIRATORY_TRACT | 1 refills | Status: AC | PRN

## 2021-04-04 NOTE — [place] FL2 (Signed)
[place] MEDICAID FL2 LEVEL OF CARE SCREENING TOOL     IDENTIFICATION  Patient Name: Sheri [name] Birthdate: Jan 15, 1926 Sex: female Admission Date (Current Location): 03/30/2021  [facility] and [name] Number:  Producer, television/film/video and Address:  [facility], [address] [place], [place] [address]      Provider Number: [id number]  Attending Physician Name and Address:  Almon Hercules, MD  Relative Name and Phone Number:       Current Level of Care: Hospital Recommended Level of Care: Skilled Nursing Facility Prior Approval Number:    Date Approved/Denied: 04/04/21 PASRR Number: [id number]  Discharge Plan: Home    Current Diagnoses: Patient Active Problem List   Diagnosis Date Noted  . Acute on chronic respiratory failure with hypercapnia (HCC) 03/31/2021  . Acute metabolic encephalopathy 03/31/2021  . COPD exacerbation (HCC) 03/31/2021  . Pulmonary hypertension (HCC) 04/18/2019  . Chronic respiratory failure with hypoxia (HCC) 03/09/2018  . Gait difficulty 02/06/2018  . Chronic obstructive pulmonary disease (HCC) 01/04/2015  . Former tobacco use 01/04/2015  . Essential hypertension 01/04/2015  . Acute renal failure (HCC) 01/04/2015  . Anemia 01/04/2015    Orientation RESPIRATION BLADDER Height & Weight     Self,Time,Situation,Place  O2 (nasaul cannula 1l/min) Incontinent Weight: 141 lb 15.6 oz (64.4 kg) Height:  5\' 2"  (157.5 cm)  BEHAVIORAL SYMPTOMS/MOOD NEUROLOGICAL BOWEL NUTRITION STATUS      Incontinent    AMBULATORY STATUS COMMUNICATION OF NEEDS Skin   Extensive Assist Verbally Normal                       Personal Care Assistance Level of Assistance  Dressing,Feeding,Bathing Bathing Assistance: Limited assistance Feeding assistance: Limited assistance Dressing Assistance: Limited assistance     Functional Limitations Info             SPECIAL CARE FACTORS FREQUENCY  PT (By licensed PT),OT (By licensed OT)      PT Frequency: 5x weekly OT Frequency: 5x weekly            Contractures Contractures Info: Not present    Additional Factors Info  Code Status,Allergies Code Status Info: full Allergies Info: mustard           Current Medications (04/04/2021):  This is the current hospital active medication list Current Facility-Administered Medications  Medication Dose Route Frequency Provider Last Rate Last Admin  . acetaminophen (TYLENOL) tablet 500 mg  500 mg Oral Q8H Gonfa, Taye T, MD   500 mg at 04/04/21 1351  . albuterol (PROVENTIL) (2.5 MG/3ML) 0.083% nebulizer solution 2.5 mg  2.5 mg Nebulization Q2H PRN 06/04/21, DO      . albuterol (PROVENTIL,VENTOLIN) solution continuous neb  10 mg/hr Nebulization Continuous Cardama, Hillary Bow, MD   Stopped at 03/31/21 0400  . cefTRIAXone (ROCEPHIN) 1 g in sodium chloride 0.9 % 100 mL IVPB  1 g Intravenous Q24H 04/02/21 M, DO 200 mL/hr at 04/04/21 0347 1 g at 04/04/21 0347  . diclofenac Sodium (VOLTAREN) 1 % topical gel 2 g  2 g Topical QID 06/04/21 T, MD   2 g at 04/04/21 1353  . enoxaparin (LOVENOX) injection 40 mg  40 mg Subcutaneous Q24H Pierce, Dwayne A, RPH   40 mg at 04/04/21 1351  . feeding supplement (ENSURE ENLIVE / ENSURE PLUS) liquid 237 mL  237 mL Oral TID BM Gonfa, Taye T, MD   237 mL at 04/04/21 1353  . ferrous  sulfate tablet 325 mg  325 mg Oral Q breakfast Hillary Bow, DO   325 mg at 04/04/21 0857  . fluticasone furoate-vilanterol (BREO ELLIPTA) 100-25 MCG/INH 1 puff  1 puff Inhalation Daily Meredeth Ide, MD   1 puff at 04/03/21 1502   And  . umeclidinium bromide (INCRUSE ELLIPTA) 62.5 MCG/INH 1 puff  1 puff Inhalation Daily Meredeth Ide, MD   1 puff at 04/03/21 1502  . guaiFENesin (MUCINEX) 12 hr tablet 600 mg  600 mg Oral BID Candelaria Stagers T, MD   600 mg at 04/04/21 0857  . guaiFENesin-dextromethorphan (ROBITUSSIN DM) 100-10 MG/5ML syrup 5 mL  5 mL Oral Q8H PRN Gonfa, Taye T, MD      . hydrALAZINE (APRESOLINE)  tablet 25 mg  25 mg Oral Q6H PRN Meredeth Ide, MD   25 mg at 03/31/21 1331  . hydrochlorothiazide (MICROZIDE) capsule 12.5 mg  12.5 mg Oral Daily Lyda Perone M, DO   12.5 mg at 04/04/21 0858  . latanoprost (XALATAN) 0.005 % ophthalmic solution 1 drop  1 drop Right Eye QHS Hillary Bow, DO   1 drop at 04/03/21 2139  . linagliptin (TRADJENTA) tablet 5 mg  5 mg Oral Daily Lyda Perone M, DO   5 mg at 04/04/21 0857  . MEDLINE mouth rinse  15 mL Mouth Rinse BID Meredeth Ide, MD   15 mL at 04/03/21 2138  . metoprolol succinate (TOPROL-XL) 24 hr tablet 50 mg  50 mg Oral Daily Lyda Perone M, DO   50 mg at 04/04/21 0858  . mirtazapine (REMERON) tablet 7.5 mg  7.5 mg Oral QHS Lyda Perone M, DO   7.5 mg at 04/03/21 2138  . multivitamin with minerals tablet 1 tablet  1 tablet Oral Daily Almon Hercules, MD   1 tablet at 04/04/21 0857  . pantoprazole (PROTONIX) EC tablet 40 mg  40 mg Oral Daily Lyda Perone M, DO   40 mg at 04/04/21 0857  . rosuvastatin (CRESTOR) tablet 20 mg  20 mg Oral QHS Lyda Perone M, DO   20 mg at 04/03/21 2138  . verapamil (CALAN-SR) CR tablet 240 mg  240 mg Oral QHS Lyda Perone M, DO   240 mg at 04/03/21 2138     Discharge Medications: Please see discharge summary for a list of discharge medications.  Relevant Imaging Results:  Relevant Lab Results:   Additional Information SSN: [id number]  Annalee Genta, LCSW

## 2021-04-04 NOTE — TOC Initial Note (Signed)
Transition of Care [facility]) - Initial/Assessment Note    Patient Details  Name: Sheri [name] MRN: [id number] Date of Birth: [birth year]  Transition of Care [facility]) CM/SW Contact:    Carley Hammed, LCSWA Phone Number: 04/04/2021, 3:36 PM  Clinical Narrative:                  CSW spoke with pt's daughter, who noted interest in SNF placement for pt. She stated pt has had covid vaccines and boosters. Her father had been to [facility] previously, but pt has never been to SNF.  Pt has been living at home with her niece and uses a walker at baseline.  Pt currently is active with [facility]. Family would prefer a [facility] facility. CSW will complete FL2 and faxout. SW will continue to follow for DC planning. Expected Discharge Plan: Skilled Nursing Facility Barriers to Discharge: Insurance Authorization,SNF Pending bed offer   Patient Goals and CMS Choice Patient states their goals for this hospitalization and ongoing recovery are:: Pt unable to participate in goal setting due to diorientation. CMS Medicare.gov Compare Post Acute Care list provided to:: Patient Represenative (must comment) (Daughter, [name]) Choice offered to / list presented to : Adult Children  Expected Discharge Plan and Services Expected Discharge Plan: Skilled Nursing Facility     Post Acute Care Choice: Skilled Nursing Facility Living arrangements for the past 2 months: Single Family Home Expected Discharge Date: 04/04/21                                    Prior Living Arrangements/Services Living arrangements for the past 2 months: Single Family Home Lives with:: Self,Relatives Patient language and need for interpreter reviewed:: Yes Do you feel safe going back to the place where you live?: Yes      Need for Family Participation in Patient Care: Yes (Comment) Care giver support system in place?: Yes (comment) Current home services: Home PT Criminal Activity/Legal Involvement Pertinent to Current  Situation/Hospitalization: No - Comment as needed  Activities of Daily Living      Permission Sought/Granted Permission sought to share information with : Family Supports Permission granted to share information with : Yes, Verbal Permission Granted  Share Information with NAME: [name]     Permission granted to share info w Relationship: Daughter  Permission granted to share info w Contact Information: [phone]  Emotional Assessment Appearance:: Appears stated age Attitude/Demeanor/Rapport: Unable to Assess Affect (typically observed): Unable to Assess Orientation: : Oriented to Self Alcohol / Substance Use: Not Applicable Psych Involvement: No (comment)  Admission diagnosis:  COPD exacerbation (HCC) [J44.1] Closed wedge compression fracture of T11 vertebra, initial encounter (HCC) [S22.080A] Acute on chronic respiratory failure with hypercapnia (HCC) [J96.22] Aortic aneurysm without rupture, unspecified portion of aorta [facility]) [I71.9] Patient Active Problem List   Diagnosis Date Noted  . Acute on chronic respiratory failure with hypercapnia (HCC) 03/31/2021  . Acute metabolic encephalopathy 03/31/2021  . COPD exacerbation (HCC) 03/31/2021  . Pulmonary hypertension (HCC) 04/18/2019  . Chronic respiratory failure with hypoxia (HCC) 03/09/2018  . Gait difficulty 02/06/2018  . Chronic obstructive pulmonary disease (HCC) 01/04/2015  . Former tobacco use 01/04/2015  . Essential hypertension 01/04/2015  . Acute renal failure (HCC) 01/04/2015  . Anemia 01/04/2015   PCP:  Renaye Rakers, MD Pharmacy:   [facility] [address] [place] - [address] [place] [address] Phone: [phone]  Fax: [phone]     Social Determinants of Health (SDOH) Interventions    Readmission Risk Interventions No flowsheet data found.

## 2021-04-04 NOTE — Care Management (Signed)
Spoke w patient's daughter on the phone to discuss DC planning and set up [facility] as ordered. We discussed PT notes and recommendations.  Daughter is interested in SNF workup. CM notified attending and CSW.  Patient was active with [facility] prior to admission.

## 2021-04-04 NOTE — Progress Notes (Addendum)
PROGRESS NOTE  Sheri [name] ZOX:[id number] DOB: 09-Jul-1926   PCP: Renaye Rakers, MD  Patient is from: Home.  Uses walker at baseline.  DOA: 03/30/2021 LOS: 4  Chief complaints: Altered mental status  Brief Narrative / Interim history: 87 year old F with PMH of COPD and chronic RF on 3 L, HTN, DM-2, T11 compression fracture and osteoarthritis presenting with altered mental status for 2 days.  Patient has had a fall 4 days prior to admission.  Per patient's daughter, patient had recurrent falls over the last 4 months with leg giving out.  No history of dementia.   In ED, VBG with acute on chronic respiratory acidosis with hypercapnia. CT head and cervical spine, DG left foot, DG left ankle, b/l knees, thoracic/lumbar spine without acute finding. Patient was admitted for acute metabolic encephalopathy likely from hypercapnia in the setting of COPD exacerbation.  Started on BiPAP and COPD pathway. MRI brain ordered but patient could not tolerate.  Anesthesia did not feel MRI under anesthesia is the safest thing to do at this time given his COPD exacerbation.  Patient's mental status improved but seems to have delirium now.    Therapy recommended SNF.  TOC working on this.  Subjective: Seen and examined earlier this morning.  No major events overnight of this morning.  Patient has no complaints but not a great historian.  Daughter and niece at bedside and concerned about taking patient home given her generalized weakness.   Objective: Vitals:   04/03/21 1801 04/03/21 2025 04/04/21 0412 04/04/21 0943  BP: 140/64 (!) 163/81 (!) 173/72 (!) 161/73  Pulse: 68 76 68 75  Resp: Temp: 97.9 F (36.6 C) (!) 97.5 F (36.4 C) 98.5 F (36.9 C) 98 F (36.7 C)  TempSrc:  Oral Oral   SpO2: 94% 91% 94% 94%  Weight:      Height:        Intake/Output Summary (Last 24 hours) at 04/04/2021 1539 Last data filed at 04/04/2021 0912 Gross per 24 hour  Intake 560 ml  Output 1450 ml  Net -890 ml    Filed Weights   03/30/21 1957 04/03/21 0915  Weight: 64.4 kg 64.4 kg    Examination:  GENERAL: No apparent distress.  Nontoxic. HEENT: MMM.  Hearing grossly intact.  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 NECK: Supple.  No apparent JVD.  RESP: 94% on 0.5 L.  No IWOB.  Fair aeration bilaterally. CVS:  RRR. Heart sounds normal.  ABD/GI/GU: BS+. Abd soft, NTND.  MSK/EXT:  Moves extremities.  Significant muscle mass and subcu fat loss. SKIN: no apparent skin lesion or wound NEURO: Awake.  Oriented to self, place and person.  No apparent focal neuro deficit. PSYCH: Calm. Normal affect.  Procedures:  None  Microbiology summarized: COVID-19 and influenza PCR nonreactive.  Assessment & Plan: Acute metabolic encephalopathy-likely due to hypercapnia.  No focal neurodeficit to suggest CVA.  CT head without acute finding but cerebral atrophy with chronic microvascular changes.  Basic encephalopathy work-up unrevealing except for mildly elevated ammonia and positive RPR.  Overall, encephalopathy improved after treatment of hypercapnia but seems to have waxing and waning mental status concerning for delirium. -Manage COPD as below -Avoid or minimize sedating medications -Low RPR titer with positive T. pallidum Ab. Clarify with family if she has been treated for syphilis  -PT/OT/SLP eval and treatment -Reorientation and delirium precautions  Acute on chronic RF with hypercapnia due to COPD exacerbation: Initial VBG with acute on chronic respiratory acidosis and  hypercapnia to 88.  Improved. -Completed antibiotic and steroid course. -Continue inhalers and nebulizers -Aspiration precautions -Wean oxygen to room air.  Goal saturation 88% and above with minimum oxygen!  Recurrent falls-reportedly knees giving out. Uses walker at baseline.  No acute finding on imaging. -Voltaren gel and scheduled Tylenol -Fall precautions -Continue PT/OT  Essential hypertension/pulmonary hypertension: BP  elevated. -Continue home Cardizem, metoprolol and HCTZ.  -Add scheduled hydralazine with as needed  History of dysphagia: Reportedly had esophageal stretching in the past.  Currently doing well.  Chronic T11 compression fracture: slightly progressed to about 50% height loss but no retropulsion.  She has no back pain or neurodeficit.  Controlled NIDDM-2 with mild hyperglycemia: A1c 5.9%.  CBG within fair range despite steroid. Recent Labs  Lab 04/02/21 1558 04/02/21 2132 04/03/21 0639 04/03/21 1142 04/04/21 0645  GLUCAP 186* 168* 129* 123* 108*  -Check CBG twice a day.  Anticipate improvement now off steroid -Continue Tradjenta  Hypokalemia/hypophosphatemia/hypomagnesemia: Resolved.  CKD-3A: Creatinine better than baseline. Recent Labs    10/28/20 1900 03/30/21 2015 03/31/21 0528 04/01/21 0403 04/02/21 0408 04/03/21 0647  BUN 15 18 19 17 16 10   CREATININE 1.30* 1.22* 1.26* 1.10* 0.97 0.91   Normocytic anemia: H&H stable.  Anemia panel basically normal. Recent Labs    10/28/20 1900 03/30/21 2015 03/30/21 2123 03/31/21 0035 03/31/21 0500 03/31/21 0528 04/01/21 0403 04/02/21 0408  HGB 10.4* 9.4* 9.4* 10.9* 9.9* 9.4* 8.8* 9.9*    Heterogenicity of thyroid gland -No follow-up needed given age  Right submandibular sialoliths -swish with lemon juice  Goal of care: 87 years of age with COPD and chronic respiratory failure.  Still full code.  I believe this would pose more harm than benefit.  I have discussed the pros and cons of CPR and intubation with patient's daughter, 99 over the phone who prefers to keep full CODE STATUS for now.  Nutrition Body mass index is 25.97 kg/m. Nutrition Problem: Increased nutrient needs Etiology: chronic illness (COPD) Signs/Symptoms: estimated needs Interventions: Ensure Enlive (each supplement provides 350kcal and 20 grams of protein),MVI   DVT prophylaxis:  enoxaparin (LOVENOX) injection 40 mg Start: 04/03/21  1430  Code Status: Full code Family Communication: Updated patient's daughters, 06/03/21 at bedside Level of care: Med-Surg Status is: Inpatient  Remains inpatient appropriate because:Altered mental status, Unsafe d/c plan and Inpatient level of care appropriate due to severity of illness   Dispo: The patient is from: Home              Anticipated d/c is to: SNF              Patient currently is not medically stable to d/c.   Difficult to place patient No       Consultants:  None   Sch Meds:  Scheduled Meds: . acetaminophen  500 mg Oral Q8H  . diclofenac Sodium  2 g Topical QID  . enoxaparin (LOVENOX) injection  40 mg Subcutaneous Q24H  . feeding supplement  237 mL Oral TID BM  . ferrous sulfate  325 mg Oral Q breakfast  . fluticasone furoate-vilanterol  1 puff Inhalation Daily   And  . umeclidinium bromide  1 puff Inhalation Daily  . guaiFENesin  600 mg Oral BID  . hydrochlorothiazide  12.5 mg Oral Daily  . latanoprost  1 drop Right Eye QHS  . linagliptin  5 mg Oral Daily  . mouth rinse  15 mL Mouth Rinse BID  . metoprolol succinate  50 mg Oral Daily  .  mirtazapine  7.5 mg Oral QHS  . multivitamin with minerals  1 tablet Oral Daily  . pantoprazole  40 mg Oral Daily  . rosuvastatin  20 mg Oral QHS  . verapamil  240 mg Oral QHS   Continuous Infusions: . albuterol Stopped (03/31/21 0400)  . cefTRIAXone (ROCEPHIN)  IV 1 g (04/04/21 0347)   PRN Meds:.albuterol, guaiFENesin-dextromethorphan, hydrALAZINE  Antimicrobials: Anti-infectives (From admission, onward)   Start     Dose/Rate Route Frequency Ordered Stop   04/01/21 0400  cefTRIAXone (ROCEPHIN) 1 g in sodium chloride 0.9 % 100 mL IVPB        1 g 200 mL/hr over 30 Minutes Intravenous Every 24 hours 03/31/21 0424     03/31/21 0430  cefTRIAXone (ROCEPHIN) 1 g in sodium chloride 0.9 % 100 mL IVPB        1 g 200 mL/hr over 30 Minutes Intravenous  Once 03/31/21 0426 03/31/21 0511       I have personally  reviewed the following labs and images: CBC: Recent Labs  Lab 03/30/21 2015 03/30/21 2123 03/31/21 0035 03/31/21 0500 03/31/21 0528 04/01/21 0403 04/02/21 0408  WBC 9.0 8.8  --   --  5.9 10.2 8.0  NEUTROABS  --  5.7  --   --   --   --   --   HGB 9.4* 9.4* 10.9* 9.9* 9.4* 8.8* 9.9*  HCT 31.1* 31.2* 32.0* 29.0* 31.7* 28.3* 31.6*  MCV 90.7 90.2  --   --  91.4 88.2 87.3  PLT 301 302  --   --  144* 327 333   BMP &GFR Recent Labs  Lab 03/30/21 2015 03/31/21 0035 03/31/21 0500 03/31/21 0528 04/01/21 0403 04/02/21 0408 04/03/21 0647  NA 135   < > 136 136 139 137 135  K 4.4   < > 4.3 4.6 4.1 3.6 4.2  CL 92*  --   --  90* 94* 94* 94*  CO2 33*  --   --  34* 34* 33* 34*  GLUCOSE 105*  --   --  147* 124* 116* 116*  BUN 18  --   --  19 17 16 10   CREATININE 1.22*  --   --  1.26* 1.10* 0.97 0.91  CALCIUM 8.8*  --   --  8.7* 9.0 9.2 8.5*  MG  --   --   --   --   --  1.7 2.0  PHOS  --   --   --   --   --  1.6* 2.5   < > = values in this interval not displayed.   Estimated Creatinine Clearance: 34.7 mL/min (by C-G formula based on SCr of 0.91 mg/dL). Liver & Pancreas: Recent Labs  Lab 03/30/21 2015 04/01/21 0403 04/02/21 0408 04/03/21 0647  AST 21 26  --   --   ALT 11 13  --   --   ALKPHOS 47 40  --   --   BILITOT 0.6 0.5  --   --   PROT 6.8 6.3*  --   --   ALBUMIN 2.7* 2.5* 2.7* 2.6*   No results for input(s): LIPASE, AMYLASE in the last 168 hours. Recent Labs  Lab 04/01/21 1358 04/02/21 0408  AMMONIA 47* 24   Diabetic: Recent Labs    04/03/21 0647  HGBA1C 5.9*   Recent Labs  Lab 04/02/21 1558 04/02/21 2132 04/03/21 0639 04/03/21 1142 04/04/21 0645  GLUCAP 186* 168* 129* 123* 108*   Cardiac Enzymes: Recent Labs  Lab 04/02/21 0408 04/03/21 0647  CKTOTAL 246* 105   No results for input(s): PROBNP in the last 8760 hours. Coagulation Profile: Recent Labs  Lab 03/30/21 2018  INR 1.1   Thyroid Function Tests: No results for input(s): TSH, T4TOTAL,  FREET4, T3FREE, THYROIDAB in the last 72 hours. Lipid Profile: No results for input(s): CHOL, HDL, LDLCALC, TRIG, CHOLHDL, LDLDIRECT in the last 72 hours. Anemia Panel: No results for input(s): VITAMINB12, FOLATE, FERRITIN, TIBC, IRON, RETICCTPCT in the last 72 hours. Urine analysis:    Component Value Date/Time   COLORURINE YELLOW 03/30/2021 2350   APPEARANCEUR CLEAR 03/30/2021 2350   LABSPEC 1.016 03/30/2021 2350   PHURINE 5.0 03/30/2021 2350   GLUCOSEU NEGATIVE 03/30/2021 2350   HGBUR SMALL (A) 03/30/2021 2350   BILIRUBINUR NEGATIVE 03/30/2021 2350   KETONESUR NEGATIVE 03/30/2021 2350   PROTEINUR NEGATIVE 03/30/2021 2350   NITRITE NEGATIVE 03/30/2021 2350   LEUKOCYTESUR NEGATIVE 03/30/2021 2350   Sepsis Labs: Invalid input(s): PROCALCITONIN, LACTICIDVEN  Microbiology: Recent Results (from the past 240 hour(s))  Resp Panel by RT-PCR (Flu A&B, Covid) Nasopharyngeal Swab     Status: None   Collection Time: 03/30/21  8:18 PM   Specimen: Nasopharyngeal Swab; Nasopharyngeal(NP) swabs in vial transport medium  Result Value Ref Range Status   SARS Coronavirus 2 by RT PCR NEGATIVE NEGATIVE Final    Comment: (NOTE) SARS-CoV-2 target nucleic acids are NOT DETECTED.  The SARS-CoV-2 RNA is generally detectable in upper respiratory specimens during the acute phase of infection. The lowest concentration of SARS-CoV-2 viral copies this assay can detect is 138 copies/mL. A negative result does not preclude SARS-Cov-2 infection and should not be used as the sole basis for treatment or other patient management decisions. A negative result may occur with  improper specimen collection/handling, submission of specimen other than nasopharyngeal swab, presence of viral mutation(s) within the areas targeted by this assay, and inadequate number of viral copies(<138 copies/mL). A negative result must be combined with clinical observations, patient history, and epidemiological information. The  expected result is Negative.  Fact Sheet for Patients:  BloggerCourse.com  Fact Sheet for Healthcare Providers:  SeriousBroker.it  This test is no t yet approved or cleared by the [place] FDA and  has been authorized for detection and/or diagnosis of SARS-CoV-2 by FDA under an Emergency Use Authorization (EUA). This EUA will remain  in effect (meaning this test can be used) for the duration of the COVID-19 declaration under Section 564(b)(1) of the Act, 21 U.S.C.section 360bbb-3(b)(1), unless the authorization is terminated  or revoked sooner.       Influenza A by PCR NEGATIVE NEGATIVE Final   Influenza B by PCR NEGATIVE NEGATIVE Final    Comment: (NOTE) The Xpert Xpress SARS-CoV-2/FLU/RSV plus assay is intended as an aid in the diagnosis of influenza from Nasopharyngeal swab specimens and should not be used as a sole basis for treatment. Nasal washings and aspirates are unacceptable for Xpert Xpress SARS-CoV-2/FLU/RSV testing.  Fact Sheet for Patients: BloggerCourse.com  Fact Sheet for Healthcare Providers: SeriousBroker.it  This test is not yet approved or cleared by the [place] FDA and has been authorized for detection and/or diagnosis of SARS-CoV-2 by FDA under an Emergency Use Authorization (EUA). This EUA will remain in effect (meaning this test can be used) for the duration of the COVID-19 declaration under Section 564(b)(1) of the Act, 21 U.S.C. section 360bbb-3(b)(1), unless the authorization is terminated or revoked.  Performed at [facility], [address],  [place], [place] [address]   Surgical pcr screen     Status: None   Collection Time: 04/02/21 11:30 PM   Specimen: Nasal Mucosa; Nasal Swab  Result Value Ref Range Status   MRSA, PCR NEGATIVE NEGATIVE Final   Staphylococcus aureus NEGATIVE NEGATIVE Final    Comment: (NOTE) The Xpert SA  Assay (FDA approved for NASAL specimens in patients 56 years of age and older), is one component of a comprehensive surveillance program. It is not intended to diagnose infection nor to guide or monitor treatment. Performed at [facility], [address] [place], [place] [address]     Radiology Studies: No results found.    Rolene Andrades T. Lealon [facility]putten [facility]  If 7PM-7AM, please contact night-coverage [email or web address] 04/04/2021, 3:39 PM

## 2021-04-05 LAB — GLUCOSE, CAPILLARY
Glucose-Capillary: 134 mg/dL — ABNORMAL HIGH (ref 70–99)
Glucose-Capillary: 114 mg/dL — ABNORMAL HIGH (ref 70–99)
Glucose-Capillary: 131 mg/dL — ABNORMAL HIGH (ref 70–99)

## 2021-04-05 MED ORDER — PENICILLIN G BENZATHINE & PROC 1200000 UNIT/2ML IM SUSP
2.4000 10*6.[IU] | Freq: Once | INTRAMUSCULAR | Status: AC
  Administered 2021-04-05: 2.4 10*6.[IU] via INTRAMUSCULAR
  Filled 2021-04-05: qty 4

## 2021-04-05 NOTE — Progress Notes (Signed)
PROGRESS NOTE    Sheri [name]  ZOX:[id number] DOB: 1925/12/30 DOA: 03/30/2021 PCP: Renaye RakersBland, Veita, MD   Brief Narrative:  87 year old F with PMH of COPD and chronic RF on 3 L, HTN, DM-2, T11 compression fracture and osteoarthritis presenting with altered mental status for 2 days.  Patient has had a fall 4 days prior to admission.  Per patient's daughter, patient had recurrent falls over the last 4 months with leg giving out.  No history of dementia.   In ED, VBG with acute on chronic respiratory acidosis with hypercapnia. CT head and cervical spine, DG left foot, DG left ankle, b/l knees, thoracic/lumbar spine without acute finding. Patient was admitted for acute metabolic encephalopathy likely from hypercapnia in the setting of COPD exacerbation.  Started on BiPAP and COPD pathway. MRI brain ordered but patient could not tolerate.  Anesthesia did not feel MRI under anesthesia is the safest thing to do at this time given his COPD exacerbation.  Patient's mental status improved but seems to have delirium now.    Therapy recommended SNF.  TOC working on this.  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  No focal neurodeficit to suggest CVA.  CT head without acute finding but cerebral atrophy with chronic microvascular changes.  Basic encephalopathy work-up unrevealing except for mildly elevated ammonia and positive RPR.  Overall, encephalopathy improved after treatment of hypercapnia but seems to have waxing and waning mental status concerning for delirium. -Manage COPD as below -Avoid or minimize sedating medications -Low RPR titer with positive T.  pallidum Ab.  Daughter does not even know what syphilis is and believes that patient has never been tested positive or treated for this.  Discussed with Dr. Earlene PlaterWallace of ID who recommends Bicillin 2,400,000 units IM weekly x3 doses. -PT/OT/SLP eval and treatment -Reorientation and delirium precautions  Acute on chronic RF with hypercapnia due to COPD exacerbation: Initial VBG with acute on chronic respiratory acidosis and hypercapnia to 88.  Improved. -Completed antibiotic and steroid course. -Continue inhalers and nebulizers -Aspiration precautions She has been weaned to room air.  Goal saturation 88% and above with minimum oxygen!  Recurrent falls-reportedly knees giving out. Uses walker at baseline.  No acute finding on imaging. -Voltaren gel and scheduled Tylenol -Fall precautions -Continue PT/OT  Essential hypertension/pulmonary hypertension: BP fairly controlled. -Continue home Cardizem, metoprolol and HCTZ.  -Add scheduled hydralazine with as needed  History of dysphagia: Reportedly had esophageal stretching in the past.  Currently doing well.  Chronic T11 compression fracture: slightly progressed to about 50% height loss but no retropulsion.  She has no back pain or neurodeficit.  Controlled NIDDM-2 with mild hyperglycemia: A1c 5.9%.  CBG within fair range despite steroid.  -Check CBG twice a day.  Anticipate improvement now off steroid -Continue Tradjenta  Hypokalemia/hypophosphatemia/hypomagnesemia: Resolved.  CKD-3A: Creatinine better than baseline.  Normocytic anemia: H&H stable.  Anemia panel basically normal.  Heterogenicity of thyroid gland -No follow-up needed given age  Right submandibular sialoliths -swish with lemon juice  Goal of care: 87 years of age with COPD and chronic respiratory failure.   Previous hospitalist has discussed the pros and cons of CPR and intubation with patient's daughter, [place] over the phone  who prefers to keep full CODE  STATUS for now.  Nutrition Body mass index is 25.97 kg/m. Nutrition Problem: Increased nutrient needs Etiology: chronic illness (COPD) Signs/Symptoms: estimated needs Interventions: Ensure Enlive (each supplement provides 350kcal and 20 grams of protein),MVI    DVT prophylaxis: enoxaparin (LOVENOX) injection 40 mg Start: 04/03/21 1430   Code Status: Full Code  Family Communication: None present at bedside.   Status is: Inpatient  Remains inpatient appropriate because:Unsafe d/c plan   Dispo: The patient is from: Home              Anticipated d/c is to: SNF              Patient currently is medically stable to d/c.   Difficult to place patient No        Estimated body mass index is 25.97 kg/m as calculated from the following:   Height as of this encounter: 5\' 2"  (1.575 m).   Weight as of this encounter: 64.4 kg.      Nutritional status:  Nutrition Problem: Increased nutrient needs Etiology: chronic illness (COPD)   Signs/Symptoms: estimated needs   Interventions: Ensure Enlive (each supplement provides 350kcal and 20 grams of protein),MVI    Consultants:   None  Procedures:   None  Antimicrobials:  Anti-infectives (From admission, onward)   Start     Dose/Rate Route Frequency Ordered Stop   04/05/21 1100  penicillin g procaine-penicillin g benzathine (BICILLIN-CR) injection 600000-600000 units        2.4 Million Units Intramuscular  Once 04/05/21 0949     04/01/21 0400  cefTRIAXone (ROCEPHIN) 1 g in sodium chloride 0.9 % 100 mL IVPB  Status:  Discontinued        1 g 200 mL/hr over 30 Minutes Intravenous Every 24 hours 03/31/21 0424 04/04/21 2132   03/31/21 0430  cefTRIAXone (ROCEPHIN) 1 g in sodium chloride 0.9 % 100 mL IVPB        1 g 200 mL/hr over 30 Minutes Intravenous  Once 03/31/21 0426 03/31/21 0511         Subjective: Seen and examined.  Alert and partially oriented but pleasant.  No complaints.  Objective: Vitals:   04/04/21  1655 04/04/21 2135 04/05/21 0555 04/05/21 0911  BP: (!) 173/97 (!) 172/80 121/65 (!) 142/62  Pulse: 67 77 63 66  Resp: 18 15 15 18   Temp: 98.3 F (36.8 C) 98.5 F (36.9 C) 98.6 F (37 C) 98.2 F (36.8 C)  TempSrc: Oral   Oral  SpO2: 96% 94% 93% 97%  Weight:      Height:        Intake/Output Summary (Last 24 hours) at 04/05/2021 1409 Last data filed at 04/05/2021 1100 Gross per 24 hour  Intake 640 ml  Output 1050 ml  Net -410 ml   Filed Weights   03/30/21 1957 04/03/21 0915  Weight: 64.4 kg 64.4 kg    Examination:  General exam: Appears calm and comfortable  Respiratory system: Clear to auscultation. Respiratory effort normal. Cardiovascular system: S1 & S2 heard, RRR. No JVD, murmurs, rubs, gallops or clicks. No pedal edema. Gastrointestinal system: Abdomen is nondistended, soft and nontender. No organomegaly or masses felt. Normal bowel sounds heard. Central nervous system: Alert and oriented x2. No focal neurological deficits. Extremities: Symmetric 5 x 5 power. Skin: No rashes, lesions or ulcers Psychiatry: Judgement and insight appear poor   Data Reviewed: I have personally reviewed following labs and imaging studies  CBC: Recent Labs  Lab 03/30/21 2015 03/30/21 2123 03/31/21 0035 03/31/21 0500 03/31/21 0528 04/01/21 0403 04/02/21 0408  WBC 9.0 8.8  --   --  5.9 10.2 8.0  NEUTROABS  --  5.7  --   --   --   --   --   HGB 9.4* 9.4* 10.9* 9.9* 9.4* 8.8* 9.9*  HCT 31.1* 31.2* 32.0* 29.0* 31.7* 28.3* 31.6*  MCV 90.7 90.2  --   --  91.4 88.2 87.3  PLT 301 302  --   --  144* 327 333   Basic Metabolic Panel: Recent Labs  Lab 03/30/21 2015 03/31/21 0035 03/31/21 0500 03/31/21 0528 04/01/21 0403 04/02/21 0408 04/03/21 0647  NA 135   < > 136 136 139 137 135  K 4.4   < > 4.3 4.6 4.1 3.6 4.2  CL 92*  --   --  90* 94* 94* 94*  CO2 33*  --   --  34* 34* 33* 34*  GLUCOSE 105*  --   --  147* 124* 116* 116*  BUN 18  --   --  CREATININE 1.22*  --    --  1.26* 1.10* 0.97 0.91  CALCIUM 8.8*  --   --  8.7* 9.0 9.2 8.5*  MG  --   --   --   --   --  1.7 2.0  PHOS  --   --   --   --   --  1.6* 2.5   < > = values in this interval not displayed.   GFR: Estimated Creatinine Clearance: 34.7 mL/min (by C-G formula based on SCr of 0.91 mg/dL). Liver Function Tests: Recent Labs  Lab 03/30/21 2015 04/01/21 0403 04/02/21 0408 04/03/21 0647  AST 21 26  --   --   ALT 11 13  --   --   ALKPHOS 47 40  --   --   BILITOT 0.6 0.5  --   --   PROT 6.8 6.3*  --   --   ALBUMIN 2.7* 2.5* 2.7* 2.6*   No results for input(s): LIPASE, AMYLASE in the last 168 hours. Recent Labs  Lab 04/01/21 1358 04/02/21 0408  AMMONIA 47* 24   Coagulation Profile: Recent Labs  Lab 03/30/21 2018  INR 1.1   Cardiac Enzymes: Recent Labs  Lab 04/02/21 0408 04/03/21 0647  CKTOTAL 246* 105   BNP (last 3 results) No results for input(s): PROBNP in the last 8760 hours. HbA1C: Recent Labs    04/03/21 0647  HGBA1C 5.9*   CBG: Recent Labs  Lab 04/03/21 0639 04/03/21 1142 04/04/21 0645 04/04/21 1625 04/05/21 0644  GLUCAP 129* 123* 108* 148* 134*   Lipid Profile: No results for input(s): CHOL, HDL, LDLCALC, TRIG, CHOLHDL, LDLDIRECT in the last 72 hours. Thyroid Function Tests: No results for input(s): TSH, T4TOTAL, FREET4, T3FREE, THYROIDAB in the last 72 hours. Anemia Panel: No results for input(s): VITAMINB12, FOLATE, FERRITIN, TIBC, IRON, RETICCTPCT in the last 72 hours. Sepsis Labs: No results for input(s): PROCALCITON, LATICACIDVEN in the last 168 hours.  Recent Results (from the past 240 hour(s))  Resp Panel by RT-PCR (Flu A&B, Covid) Nasopharyngeal Swab     Status: None   Collection Time: 03/30/21  8:18 PM   Specimen: Nasopharyngeal Swab; Nasopharyngeal(NP) swabs in vial transport medium  Result Value Ref Range Status   SARS Coronavirus 2 by RT PCR NEGATIVE NEGATIVE Final    Comment: (NOTE) SARS-CoV-2 target nucleic acids are NOT  DETECTED.  The SARS-CoV-2 RNA is generally detectable in upper respiratory specimens during the acute phase of infection. The lowest concentration of SARS-CoV-2 viral copies this assay can detect is 138 copies/mL. A negative result does not preclude SARS-Cov-2 infection and should not be used as the sole basis for treatment or other patient management decisions. A negative result may occur with  improper specimen collection/handling, submission of specimen other than nasopharyngeal swab, presence of viral mutation(s) within the areas targeted by this assay, and inadequate number of viral copies(<138 copies/mL). A negative result must be combined with clinical observations, patient history, and epidemiological information. The expected result is Negative.  Fact Sheet for Patients:  BloggerCourse.com  Fact Sheet for Healthcare Providers:  SeriousBroker.it  This test is no t yet approved or cleared by the [place] FDA and  has been authorized for detection and/or diagnosis of SARS-CoV-2 by FDA under an Emergency Use Authorization (EUA). This EUA will remain  in effect (meaning this test can be used) for the duration of the COVID-19 declaration under Section 564(b)(1) of the Act, 21 U.S.C.section 360bbb-3(b)(1), unless the authorization is terminated  or revoked sooner.       Influenza A by PCR NEGATIVE NEGATIVE Final   Influenza B by PCR NEGATIVE NEGATIVE Final    Comment: (NOTE) The Xpert Xpress SARS-CoV-2/FLU/RSV plus assay is intended as an aid in the diagnosis of influenza from Nasopharyngeal swab specimens and should not be used as a sole basis for treatment. Nasal washings and aspirates are unacceptable for Xpert Xpress SARS-CoV-2/FLU/RSV testing.  Fact Sheet for Patients: BloggerCourse.com  Fact Sheet for Healthcare Providers: SeriousBroker.it  This test is not yet  approved or cleared by the [place] FDA and has been authorized for detection and/or diagnosis of SARS-CoV-2 by FDA under an Emergency Use Authorization (EUA). This EUA will remain in effect (meaning this test can be used) for the duration of the COVID-19 declaration under Section 564(b)(1) of the Act, 21 U.S.C. section 360bbb-3(b)(1), unless the authorization is terminated or revoked.  Performed at [facility], [address], [place], [place] [address]   Surgical pcr screen     Status: None   Collection Time: 04/02/21 11:30 PM   Specimen: Nasal Mucosa; Nasal Swab  Result Value Ref Range Status   MRSA, PCR NEGATIVE NEGATIVE Final   Staphylococcus aureus NEGATIVE NEGATIVE Final    Comment: (NOTE) The Xpert SA Assay (FDA approved for NASAL specimens in patients 39 years of age and older), is one component of a comprehensive surveillance program. It is not intended to diagnose infection nor to guide or monitor treatment. Performed at [facility], [address] [place], [place] [address]       Radiology Studies: No results found.  Scheduled Meds: . acetaminophen  500 mg Oral Q8H  . diclofenac Sodium  2 g Topical QID  . enoxaparin (LOVENOX) injection  40 mg Subcutaneous Q24H  . feeding supplement  237 mL Oral TID BM  . ferrous sulfate  325 mg Oral Q breakfast  . fluticasone furoate-vilanterol  1 puff Inhalation Daily   And  . umeclidinium bromide  1 puff Inhalation Daily  . guaiFENesin  600 mg Oral BID  . hydrALAZINE  25 mg Oral Q8H  . hydrochlorothiazide  12.5 mg Oral Daily  . latanoprost  1 drop Right Eye QHS  . linagliptin  5 mg Oral Daily  . mouth rinse  15 mL Mouth Rinse BID  . metoprolol succinate  50 mg  Oral Daily  . mirtazapine  7.5 mg Oral QHS  . multivitamin with minerals  1 tablet Oral Daily  . pantoprazole  40 mg Oral Daily  . penicillin G procaine-benzathine  2.4 Million Units Intramuscular Once  . rosuvastatin  20 mg Oral QHS  .  verapamil  240 mg Oral QHS   Continuous Infusions: . albuterol Stopped (03/31/21 0400)     LOS: 5 days   Time spent: 32 minutes   Hughie Closs, MD [facility]  04/05/2021, 2:09 PM   How to contact the [facility] Attending or Consulting provider 7A - 7P or covering provider during after hours 7P -7A, for this patient?  1. Check the care team in [facility] and look for a) attending/consulting TRH provider listed and b) the [facility] team listed. Page or secure chat 7A-7P. 2. Log into [email or web address] and use [place] universal password to access. If you do not have the password, please contact the hospital operator. 3. Locate the [name] provider you are looking for under [facility] and page to a number that you can be directly reached. 4. If you still have difficulty reaching the provider, please page the [name] (Director on Call) for the Hospitalists listed on amion for assistance.

## 2021-04-05 NOTE — TOC Progression Note (Signed)
Transition of Care [facility]) - Progression Note    Patient Details  Name: Sheri [name] MRN: [id number] Date of Birth: [birth year]  Transition of Care [facility]) CM/SW Contact  Carley Hammed, [place] Phone Number: 04/05/2021, 4:35 PM  Clinical Narrative:    CSW spoke with daughter [name] and provided bed offers. She said she would look into [place], but [place] was too far. She requested CSW look into [place]. CSW called and Tresa Endo will review referral. SW will continue to follow.   Expected Discharge Plan: Skilled Nursing Facility Barriers to Discharge: Insurance Authorization,SNF Pending bed offer  Expected Discharge Plan and Services Expected Discharge Plan: Skilled Nursing Facility     Post Acute Care Choice: Skilled Nursing Facility Living arrangements for the past 2 months: Single Family Home Expected Discharge Date: 04/04/21                                     Social Determinants of Health (SDOH) Interventions    Readmission Risk Interventions No flowsheet data found.

## 2021-04-05 NOTE — Progress Notes (Signed)
Patient exhibited poor po intake throughout shift.  Noted to be more lethargic.  Is taking in fluids with encouragement.

## 2021-04-06 ENCOUNTER — Inpatient Hospital Stay (HOSPITAL_COMMUNITY): Payer: Medicare Other

## 2021-04-06 LAB — GLUCOSE, CAPILLARY
Glucose-Capillary: 110 mg/dL — ABNORMAL HIGH (ref 70–99)
Glucose-Capillary: 123 mg/dL — ABNORMAL HIGH (ref 70–99)

## 2021-04-06 LAB — RESP PANEL BY RT-PCR (FLU A&B, COVID) ARPGX2
Influenza A by PCR: NEGATIVE
Influenza B by PCR: NEGATIVE
SARS Coronavirus 2 by RT PCR: NEGATIVE

## 2021-04-06 MED ORDER — PENICILLIN G BENZATHINE 1200000 UNIT/2ML IM SUSY
2.4000 10*6.[IU] | PREFILLED_SYRINGE | INTRAMUSCULAR | Status: DC
  Filled 2021-04-06: qty 4

## 2021-04-06 MED ORDER — PENICILLIN G BENZATHINE 1200000 UNIT/2ML IM SUSY
2.4000 10*6.[IU] | PREFILLED_SYRINGE | INTRAMUSCULAR | Status: AC
  Administered 2021-04-12 – 2021-04-19 (×2): 2.4 10*6.[IU] via INTRAMUSCULAR
  Filled 2021-04-06 (×3): qty 4

## 2021-04-06 NOTE — Plan of Care (Signed)
  Problem: Education: Goal: Knowledge of General Education information will improve Description Including pain rating scale, medication(s)/side effects and non-pharmacologic comfort measures Outcome: Progressing   

## 2021-04-06 NOTE — Plan of Care (Signed)
  Problem: Education: Goal: Knowledge of General Education information will improve Description: Including pain rating scale, medication(s)/side effects and non-pharmacologic comfort measures 04/06/2021 0007 by Treasa School, RN Outcome: Progressing 04/06/2021 0006 by Treasa School, RN Outcome: Progressing   Problem: Health Behavior/Discharge Planning: Goal: Ability to manage health-related needs will improve 04/06/2021 0007 by Treasa School, RN Outcome: Progressing 04/06/2021 0006 by Treasa School, RN Outcome: Progressing   Problem: Clinical Measurements: Goal: Ability to maintain clinical measurements within normal limits will improve 04/06/2021 0007 by Treasa School, RN Outcome: Progressing 04/06/2021 0006 by Treasa School, RN Outcome: Progressing Goal: Will remain free from infection 04/06/2021 0007 by Treasa School, RN Outcome: Progressing 04/06/2021 0006 by Treasa School, RN Outcome: Progressing Goal: Diagnostic test results will improve 04/06/2021 0007 by Treasa School, RN Outcome: Progressing 04/06/2021 0006 by Treasa School, RN Outcome: Progressing Goal: Respiratory complications will improve 04/06/2021 0007 by Treasa School, RN Outcome: Progressing 04/06/2021 0006 by Treasa School, RN Outcome: Progressing Goal: Cardiovascular complication will be avoided 04/06/2021 0007 by Treasa School, RN Outcome: Progressing 04/06/2021 0006 by Treasa School, RN Outcome: Progressing   Problem: Safety: Goal: Ability to remain free from injury will improve 04/06/2021 0007 by Treasa School, RN Outcome: Progressing 04/06/2021 0006 by Treasa School, RN Outcome: Progressing   Problem: Pain Managment: Goal: General experience of comfort will improve 04/06/2021 0007 by Treasa School, RN Outcome: Progressing 04/06/2021 0006 by Treasa School, RN Outcome: Progressing   Problem: Skin Integrity: Goal: Risk  for impaired skin integrity will decrease 04/06/2021 0007 by Treasa School, RN Outcome: Progressing 04/06/2021 0006 by Treasa School, RN Outcome: Progressing

## 2021-04-06 NOTE — TOC Progression Note (Addendum)
Transition of Care [facility]) - Progression Note    Patient Details  Name: Sheri [name] MRN: [id number] Date of Birth: [birth year]  [facility]) CM/SW Contact  Okey Dupre Lazaro Arms, LCSW Phone Number: 04/06/2021, 2:21 PM  Clinical Narrative: Talked with daughter [name] [phone]) regarding facility selection for ST rehab. Daughter reported that she just left [facility] and wants this facility for her mother. Contacted [facility] and talked with admissions director Star and they can accept patient once insurance auth received.  CSW initiated insurance authorization online with [employer]. Patient Berkley Harvey ID is [id number] with an authorization effective date of 04/06/21.    4:27 pm: Checked NH and patient approved for SNF placement: [employer] a Auth ID - [id number] Plan Auth ID - [id number] Approval dates: 6/6 - 6/8 Next Review date 6/8 **Call made to [facility] and message left for Star in admissions to call CSW. Also HIPPA compliant text sent. No response as of 5:09 pm. CSW will f/u with Star on 6/7. Patient's nurse updated.     Expected Discharge Plan: Skilled Nursing Facility Barriers to Discharge: Insurance Authorization,SNF Pending bed offer  Expected Discharge Plan and Services Expected Discharge Plan: Skilled Nursing Facility     Post Acute Care Choice: Skilled Nursing Facility Living arrangements for the past 2 months: Single Family Home Expected Discharge Date: 04/04/21                                     Social Determinants of Health (SDOH) Interventions    Readmission Risk Interventions No flowsheet data found.

## 2021-04-06 NOTE — Progress Notes (Signed)
Hospital Medicine Overnight Event Note  Notified by nurse for concern over patient yelling out that she "can't breathe". Nurse also noted "gurgling sound" at PO medication administration. Breathing treatment has been administered prior to notification.  Oxygen by Nasal cannula was increased by nurse to 2L via [place] with O2 sats maintained throughout at mid 90s Chest xray, NPO, and swallow evaluation ordered:.concern for aspiration  [name] MSNA ACNPC-AG Acute Care Nurse Practitioner [facility] Pager [phone] [place]

## 2021-04-06 NOTE — Progress Notes (Signed)
  Speech Language Pathology Treatment: Dysphagia  Patient Details Name: Sheri [name] MRN: [id number] DOB: 02/19/1926 Today's Date: 04/06/2021 Time: 1131-1150 SLP Time Calculation (min) (ACUTE ONLY): 19.35 min  Assessment / Plan / Recommendation Clinical Impression  Pt was seen for dysphagia treatment. Pt's nurse, Sheri [name], reported that the pt continues to inconsistently cough with p.o. intake. Pt's daughter Sheri [name] was contacted via phone with similar reports to the RN. Pt was alert throughout the session, but cooperation was limited. Pt was more confused today and was insistent that this SLP was attempting to poison her with water in a cup. SLP brought sealed juices and opened them in pt's view to reduce pt's hesitation; however, pt's conviction remained. Pt was reoriented to place by pt's daughter via phone and she took a single sip of thin liquids via straw without overt s/sx of aspiration, but she refused all additional boluses of liquids and various consistencies of solids. Considering pt's presentation today, it is unlikely that the pt would participate in any fluoroscopic study (e.g., esophagram/MBS) today which would necessitate her consuming white liquids/solids. Pt's current diet will be continued and SLP will continue to follow pt.    HPI HPI: Pt is a 87 year old woman admitted on 03/31/21 with AMS, COPD exacerbation  and falls. Pt injured her L ankle/foot and hit her head in a fall. Head CT negative, CXR on admission negative. Esophageal dysphagia reported by pt's daughter to referring MD. Esophagram 08/06/19: Esophageal dysmotility, likely presbyesophagus. Small hiatal hernia with mild stricture as evidenced by underdistention at the gastroesophageal junction. PMH: COPD on 3L at home, t11 compression fx, HTN, AAA.      SLP Plan  Continue with current plan of care       Recommendations  Diet recommendations: Regular;Thin liquid Liquids provided via: Cup;Straw Medication  Administration: Crushed with puree (meds which cannot be crushed may be given whole in puree) Supervision: Staff to assist with self feeding Compensations: Slow rate;Small sips/bites;Follow solids with liquid                Oral Care Recommendations: Oral care BID Follow up Recommendations:  (TBD) SLP Visit Diagnosis: Dysphagia, unspecified (R13.10) Plan: Continue with current plan of care       [name] I. Vear Clock, MS, CCC-SLP Acute Rehabilitation Services Office number [phone] Pager [phone]                [name] 04/06/2021, 1:40 PM

## 2021-04-06 NOTE — Progress Notes (Signed)
PROGRESS NOTE    Sheri [name]  WCH:[id number] DOB: 1926-04-24 DOA: 03/30/2021 PCP: Sheri Rakers, MD   Brief Narrative:  87 year old F with PMH of COPD and chronic RF on 3 L, HTN, DM-2, T11 compression fracture and osteoarthritis presenting with altered mental status for 2 days.  Patient has had a fall 4 days prior to admission.  Per patient's daughter, patient had recurrent falls over the last 4 months with leg giving out.  No history of dementia.   In ED, VBG with acute on chronic respiratory acidosis with hypercapnia. CT head and cervical spine, DG left foot, DG left ankle, b/l knees, thoracic/lumbar spine without acute finding. Patient was admitted for acute metabolic encephalopathy likely from hypercapnia in the setting of COPD exacerbation.  Started on BiPAP and COPD pathway. MRI brain ordered but patient could not tolerate.  Anesthesia did not feel MRI under anesthesia is the safest thing to do at this time given his COPD exacerbation.  Patient's mental status improved but seems to have delirium now.    Therapy recommended SNF.  TOC working on this.  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  No focal neurodeficit to suggest CVA.  CT head without acute finding but cerebral atrophy with chronic microvascular changes.  Basic encephalopathy work-up unrevealing except for mildly elevated ammonia and positive RPR.  Overall, encephalopathy improved after treatment of hypercapnia but seemed to have waxing and waning mental status concerning for delirium.  Patient has remained alert and partly oriented since yesterday. -Manage COPD as below -Avoid or  minimize sedating medications -Low RPR titer with positive T. pallidum Ab.  Daughter does not even know what syphilis is and believes that patient has never been tested positive or treated for this.  Discussed with Dr. Earlene Plater of ID who recommends Bicillin 2,400,000 units IM weekly x3 doses. -PT/OT/SLP eval and treatment  Acute on chronic RF with hypercapnia due to COPD exacerbation: Initial VBG with acute on chronic respiratory acidosis and hypercapnia to 88.  Improved. -Completed antibiotic and steroid course. -Continue inhalers and nebulizers -Aspiration precautions She has been weaned to room air.  Goal saturation 88% and above with minimum oxygen!  Recurrent falls-reportedly knees giving out. Uses walker at baseline.  No acute finding on imaging. -Voltaren gel and scheduled Tylenol -Fall precautions -Continue PT/OT  Essential hypertension/pulmonary hypertension: BP fairly controlled. -Continue home verapamil, metoprolol, HCTZ.  Added scheduled hydralazine here as well as as needed hydralazine.  History of dysphagia: Reportedly had esophageal stretching in the past.  Currently doing well.  Chronic T11 compression fracture: slightly progressed to about 50% height loss but no retropulsion.  She has no back pain or neurodeficit.  Controlled NIDDM-2 with mild hyperglycemia: A1c 5.9%.  CBG within fair range despite steroid.  -Check CBG twice a day.  Anticipate improvement now off steroid -Continue Tradjenta  Hypokalemia/hypophosphatemia/hypomagnesemia: Resolved.  CKD-3A: Creatinine better than baseline.  Normocytic anemia: H&H stable.  Anemia panel basically normal.  Heterogenicity of thyroid gland -No follow-up needed given age  Goal of care: 87 years of age with COPD and chronic respiratory failure.   Previous hospitalist had discussed the pros and cons of CPR and intubation with patient's daughter, Sheri [name] over the  phone who prefers to keep full CODE STATUS for  now.  Nutrition Body mass index is 25.97 kg/m. Nutrition Problem: Increased nutrient needs Etiology: chronic illness (COPD) Signs/Symptoms: estimated needs Interventions: Ensure Enlive (each supplement provides 350kcal and 20 grams of protein),MVI    DVT prophylaxis: enoxaparin (LOVENOX) injection 40 mg Start: 04/03/21 1430   Code Status: Full Code  Family Communication: None present at bedside.  Discussed with daughter yesterday.  Status is: Inpatient  Remains inpatient appropriate because:Unsafe d/c plan   Dispo: The patient is from: Home              Anticipated d/c is to: SNF              Patient currently is medically stable to d/c.   Difficult to place patient No        Estimated body mass index is 25.97 kg/m as calculated from the following:   Height as of this encounter: 5\' 2"  (1.575 m).   Weight as of this encounter: 64.4 kg.      Nutritional status:  Nutrition Problem: Increased nutrient needs Etiology: chronic illness (COPD)   Signs/Symptoms: estimated needs   Interventions: Ensure Enlive (each supplement provides 350kcal and 20 grams of protein),MVI    Consultants:   None  Procedures:   None  Antimicrobials:  Anti-infectives (From admission, onward)   Start     Dose/Rate Route Frequency Ordered Stop   04/12/21 1000  penicillin g benzathine (BICILLIN LA) 1200000 UNIT/2ML injection 2.4 Million Units        2.4 Million Units Intramuscular Weekly 04/06/21 1018 04/26/21 0959   04/06/21 1100  penicillin g benzathine (BICILLIN LA) 1200000 UNIT/2ML injection 2.4 Million Units  Status:  Discontinued        2.4 Million Units Intramuscular Weekly 04/06/21 1012 04/06/21 1018   04/05/21 1100  penicillin g procaine-penicillin g benzathine (BICILLIN-CR) injection 600000-600000 units        2.4 Million Units Intramuscular  Once 04/05/21 0949 04/05/21 1511   04/01/21 0400  cefTRIAXone (ROCEPHIN) 1 g in sodium chloride 0.9 % 100 mL IVPB  Status:   Discontinued        1 g 200 mL/hr over 30 Minutes Intravenous Every 24 hours 03/31/21 0424 04/04/21 2132   03/31/21 0430  cefTRIAXone (ROCEPHIN) 1 g in sodium chloride 0.9 % 100 mL IVPB        1 g 200 mL/hr over 30 Minutes Intravenous  Once 03/31/21 0426 03/31/21 0511         Subjective: Seen and examined.  Alert and pleasantly confused as yesterday.  No complaints.  Objective: Vitals:   04/05/21 2054 04/06/21 0000 04/06/21 0457 04/06/21 0919  BP: (!) 150/64 (!) 131/57 (!) 135/59 (!) 132/52  Pulse: 63 63 64 (!) 59  Resp: 18 18 16 15   Temp: 98.2 F (36.8 C) 97.7 F (36.5 C) (!) 97.5 F (36.4 C) (!) 97.4 F (36.3 C)  TempSrc: Axillary Axillary Oral Oral  SpO2: 97% 97% 97% 99%  Weight:      Height:        Intake/Output Summary (Last 24 hours) at 04/06/2021 1129 Last data filed at 04/06/2021 0700 Gross per 24 hour  Intake 240 ml  Output 400 ml  Net -160 ml   Filed Weights   03/30/21 1957 04/03/21 0915  Weight: 64.4 kg 64.4 kg    Examination:  General exam: Appears calm and comfortable  Respiratory system: Clear to auscultation. Respiratory effort normal. Cardiovascular system: S1 &  S2 heard, RRR. No JVD, murmurs, rubs, gallops or clicks. No pedal edema. Gastrointestinal system: Abdomen is nondistended, soft and nontender. No organomegaly or masses felt. Normal bowel sounds heard. Central nervous system: Alert and and oriented x2. Extremities: Symmetric 5 x 5 power. Skin: No rashes, lesions or ulcers.  Psychiatry: Judgement and insight appear poor  Data Reviewed: I have personally reviewed following labs and imaging studies  CBC: Recent Labs  Lab 03/30/21 2015 03/30/21 2123 03/31/21 0035 03/31/21 0500 03/31/21 0528 04/01/21 0403 04/02/21 0408  WBC 9.0 8.8  --   --  5.9 10.2 8.0  NEUTROABS  --  5.7  --   --   --   --   --   HGB 9.4* 9.4* 10.9* 9.9* 9.4* 8.8* 9.9*  HCT 31.1* 31.2* 32.0* 29.0* 31.7* 28.3* 31.6*  MCV 90.7 90.2  --   --  91.4 88.2 87.3  PLT  301 302  --   --  144* 327 333   Basic Metabolic Panel: Recent Labs  Lab 03/30/21 2015 03/31/21 0035 03/31/21 0500 03/31/21 0528 04/01/21 0403 04/02/21 0408 04/03/21 0647  NA 135   < > 136 136 139 137 135  K 4.4   < > 4.3 4.6 4.1 3.6 4.2  CL 92*  --   --  90* 94* 94* 94*  CO2 33*  --   --  34* 34* 33* 34*  GLUCOSE 105*  --   --  147* 124* 116* 116*  BUN 18  --   --  19 17 16 10   CREATININE 1.22*  --   --  1.26* 1.10* 0.97 0.91  CALCIUM 8.8*  --   --  8.7* 9.0 9.2 8.5*  MG  --   --   --   --   --  1.7 2.0  PHOS  --   --   --   --   --  1.6* 2.5   < > = values in this interval not displayed.   GFR: Estimated Creatinine Clearance: 34.7 mL/min (by C-G formula based on SCr of 0.91 mg/dL). Liver Function Tests: Recent Labs  Lab 03/30/21 2015 04/01/21 0403 04/02/21 0408 04/03/21 0647  AST 21 26  --   --   ALT 11 13  --   --   ALKPHOS 47 40  --   --   BILITOT 0.6 0.5  --   --   PROT 6.8 6.3*  --   --   ALBUMIN 2.7* 2.5* 2.7* 2.6*   No results for input(s): LIPASE, AMYLASE in the last 168 hours. Recent Labs  Lab 04/01/21 1358 04/02/21 0408  AMMONIA 47* 24   Coagulation Profile: Recent Labs  Lab 03/30/21 2018  INR 1.1   Cardiac Enzymes: Recent Labs  Lab 04/02/21 0408 04/03/21 0647  CKTOTAL 246* 105   BNP (last 3 results) No results for input(s): PROBNP in the last 8760 hours. HbA1C: No results for input(s): HGBA1C in the last 72 hours. CBG: Recent Labs  Lab 04/04/21 1625 04/05/21 0644 04/05/21 1620 04/05/21 2124 04/06/21 0650  GLUCAP 148* 134* 114* 131* 110*   Lipid Profile: No results for input(s): CHOL, HDL, LDLCALC, TRIG, CHOLHDL, LDLDIRECT in the last 72 hours. Thyroid Function Tests: No results for input(s): TSH, T4TOTAL, FREET4, T3FREE, THYROIDAB in the last 72 hours. Anemia Panel: No results for input(s): VITAMINB12, FOLATE, FERRITIN, TIBC, IRON, RETICCTPCT in the last 72 hours. Sepsis Labs: No results for input(s): PROCALCITON,  LATICACIDVEN in the last 168 hours.  Recent Results (from the past 240 hour(s))  Resp Panel by RT-PCR (Flu A&B, Covid) Nasopharyngeal Swab     Status: None   Collection Time: 03/30/21  8:18 PM   Specimen: Nasopharyngeal Swab; Nasopharyngeal(NP) swabs in vial transport medium  Result Value Ref Range Status   SARS Coronavirus 2 by RT PCR NEGATIVE NEGATIVE Final    Comment: (NOTE) SARS-CoV-2 target nucleic acids are NOT DETECTED.  The SARS-CoV-2 RNA is generally detectable in upper respiratory specimens during the acute phase of infection. The lowest concentration of SARS-CoV-2 viral copies this assay can detect is 138 copies/mL. A negative result does not preclude SARS-Cov-2 infection and should not be used as the sole basis for treatment or other patient management decisions. A negative result may occur with  improper specimen collection/handling, submission of specimen other than nasopharyngeal swab, presence of viral mutation(s) within the areas targeted by this assay, and inadequate number of viral copies(<138 copies/mL). A negative result must be combined with clinical observations, patient history, and epidemiological information. The expected result is Negative.  Fact Sheet for Patients:  BloggerCourse.com  Fact Sheet for Healthcare Providers:  SeriousBroker.it  This test is no t yet approved or cleared by the [place] FDA and  has been authorized for detection and/or diagnosis of SARS-CoV-2 by FDA under an Emergency Use Authorization (EUA). This EUA will remain  in effect (meaning this test can be used) for the duration of the COVID-19 declaration under Section 564(b)(1) of the Act, 21 U.S.C.section 360bbb-3(b)(1), unless the authorization is terminated  or revoked sooner.       Influenza A by PCR NEGATIVE NEGATIVE Final   Influenza B by PCR NEGATIVE NEGATIVE Final    Comment: (NOTE) The Xpert Xpress  SARS-CoV-2/FLU/RSV plus assay is intended as an aid in the diagnosis of influenza from Nasopharyngeal swab specimens and should not be used as a sole basis for treatment. Nasal washings and aspirates are unacceptable for Xpert Xpress SARS-CoV-2/FLU/RSV testing.  Fact Sheet for Patients: BloggerCourse.com  Fact Sheet for Healthcare Providers: SeriousBroker.it  This test is not yet approved or cleared by the [place] FDA and has been authorized for detection and/or diagnosis of SARS-CoV-2 by FDA under an Emergency Use Authorization (EUA). This EUA will remain in effect (meaning this test can be used) for the duration of the COVID-19 declaration under Section 564(b)(1) of the Act, 21 U.S.C. section 360bbb-3(b)(1), unless the authorization is terminated or revoked.  Performed at [facility], [address], [place], [place] [address]   Surgical pcr screen     Status: None   Collection Time: 04/02/21 11:30 PM   Specimen: Nasal Mucosa; Nasal Swab  Result Value Ref Range Status   MRSA, PCR NEGATIVE NEGATIVE Final   Staphylococcus aureus NEGATIVE NEGATIVE Final    Comment: (NOTE) The Xpert SA Assay (FDA approved for NASAL specimens in patients 24 years of age and older), is one component of a comprehensive surveillance program. It is not intended to diagnose infection nor to guide or monitor treatment. Performed at [facility], [address], [place], [place] [address]       Radiology Studies: No results found.  Scheduled Meds: . acetaminophen  500 mg Oral Q8H  . diclofenac Sodium  2 g Topical QID  . enoxaparin (LOVENOX) injection  40 mg Subcutaneous Q24H  . feeding supplement  237 mL Oral TID BM  . ferrous sulfate  325 mg Oral Q breakfast  . fluticasone furoate-vilanterol  1 puff  Inhalation Daily   And  . umeclidinium bromide  1 puff Inhalation Daily  . guaiFENesin  600 mg Oral BID  . hydrALAZINE  25 mg  Oral Q8H  . hydrochlorothiazide  12.5 mg Oral Daily  . latanoprost  1 drop Right Eye QHS  . linagliptin  5 mg Oral Daily  . mouth rinse  15 mL Mouth Rinse BID  . metoprolol succinate  50 mg Oral Daily  . mirtazapine  7.5 mg Oral QHS  . multivitamin with minerals  1 tablet Oral Daily  . pantoprazole  40 mg Oral Daily  . [START ON 04/12/2021] penicillin g benzathine (BICILLIN-LA) IM  2.4 Million Units Intramuscular Weekly  . rosuvastatin  20 mg Oral QHS  . verapamil  240 mg Oral QHS   Continuous Infusions: . albuterol Stopped (03/31/21 0400)     LOS: 6 days   Time spent: 28 minutes   Hughie Closs, MD [facility]  04/06/2021, 11:29 AM   How to contact the [facility] Attending or Consulting provider 7A - 7P or covering provider during after hours 7P -7A, for this patient?  1. Check the care team in [facility] and look for a) attending/consulting TRH provider listed and b) the [facility] team listed. Page or secure chat 7A-7P. 2. Log into [email or web address] and use [place]'s universal password to access. If you do not have the password, please contact the hospital operator. 3. Locate the [facility] provider you are looking for under [facility] and page to a number that you can be directly reached. 4. If you still have difficulty reaching the provider, please page the [facility] (Director on Call) for the Hospitalists listed on amion for assistance.

## 2021-04-07 LAB — BLOOD GAS, ARTERIAL
Acid-Base Excess: 6.5 mmol/L — ABNORMAL HIGH (ref 0.0–2.0)
Acid-Base Excess: 5.1 mmol/L — ABNORMAL HIGH (ref 0.0–2.0)
Bicarbonate: 32.8 mmol/L — ABNORMAL HIGH (ref 20.0–28.0)
Bicarbonate: 30.5 mmol/L — ABNORMAL HIGH (ref 20.0–28.0)
Drawn by: 331761
FIO2: 30
FIO2: 30
O2 Saturation: 98.2 %
O2 Saturation: 96.7 %
Patient temperature: 36.9
Patient temperature: 36.5
pCO2 arterial: 70.4 mmHg (ref 32.0–48.0)
pCO2 arterial: 55.3 mmHg — ABNORMAL HIGH (ref 32.0–48.0)
pH, Arterial: 7.29 — ABNORMAL LOW (ref 7.350–7.450)
pH, Arterial: 7.357 (ref 7.350–7.450)
pO2, Arterial: 130 mmHg — ABNORMAL HIGH (ref 83.0–108.0)
pO2, Arterial: 90.4 mmHg (ref 83.0–108.0)

## 2021-04-07 LAB — VITAMIN B1: Vitamin B1 (Thiamine): 135.2 nmol/L (ref 66.5–200.0)

## 2021-04-07 LAB — BASIC METABOLIC PANEL
Anion gap: 7 (ref 5–15)
BUN: 18 mg/dL (ref 8–23)
CO2: 34 mmol/L — ABNORMAL HIGH (ref 22–32)
Calcium: 9.1 mg/dL (ref 8.9–10.3)
Chloride: 85 mmol/L — ABNORMAL LOW (ref 98–111)
Creatinine, Ser: 1.01 mg/dL — ABNORMAL HIGH (ref 0.44–1.00)
GFR, Estimated: 52 mL/min — ABNORMAL LOW (ref >60)
Glucose, Bld: 142 mg/dL — ABNORMAL HIGH (ref 70–99)
Potassium: 3.4 mmol/L — ABNORMAL LOW (ref 3.5–5.1)
Sodium: 126 mmol/L — ABNORMAL LOW (ref 135–145)

## 2021-04-07 LAB — CBC
HCT: 33.6 % — ABNORMAL LOW (ref 36.0–46.0)
Hemoglobin: 10.8 g/dL — ABNORMAL LOW (ref 12.0–15.0)
MCH: 27.1 pg (ref 26.0–34.0)
MCHC: 32.1 g/dL (ref 30.0–36.0)
MCV: 84.2 fL (ref 80.0–100.0)
Platelets: 430 10*3/uL — ABNORMAL HIGH (ref 150–400)
RBC: 3.99 MIL/uL (ref 3.87–5.11)
RDW: 14.6 % (ref 11.5–15.5)
WBC: 11.7 10*3/uL — ABNORMAL HIGH (ref 4.0–10.5)
nRBC: 0 % (ref 0.0–0.2)

## 2021-04-07 LAB — GLUCOSE, CAPILLARY
Glucose-Capillary: 100 mg/dL — ABNORMAL HIGH (ref 70–99)
Glucose-Capillary: 100 mg/dL — ABNORMAL HIGH (ref 70–99)
Glucose-Capillary: 113 mg/dL — ABNORMAL HIGH (ref 70–99)
Glucose-Capillary: 83 mg/dL (ref 70–99)
Glucose-Capillary: 92 mg/dL (ref 70–99)
Glucose-Capillary: 94 mg/dL (ref 70–99)

## 2021-04-07 LAB — MAGNESIUM: Magnesium: 2 mg/dL (ref 1.7–2.4)

## 2021-04-07 MED ORDER — POTASSIUM CHLORIDE 10 MEQ/100ML IV SOLN
10.0000 meq | INTRAVENOUS | Status: AC
  Administered 2021-04-07 (×4): 10 meq via INTRAVENOUS
  Filled 2021-04-07 (×4): qty 100

## 2021-04-07 NOTE — TOC Progression Note (Signed)
Transition of Care [facility]) - Progression Note    Patient Details  Name: Sheri [name] MRN: [id number] Date of Birth: 19-Jun-1926  Transition of Care [facility]) CM/SW Contact  Okey Dupre Lazaro Arms, LCSW Phone Number: 04/07/2021, 3:09 PM  Clinical Narrative:  CSW informed by MD that patient not medically ready for discharge today. Call made to [facility] H&R and admissions staff person [facility] informed.  Insurance authorization information provided to Admissions staff person [facility] on 6/6 and is in 6/6 progress note.     Expected Discharge Plan: Skilled Nursing Facility Barriers to Discharge: Insurance Authorization,SNF Pending bed offer  Expected Discharge Plan and Services Expected Discharge Plan: Skilled Nursing Facility     Post Acute Care Choice: Skilled Nursing Facility Living arrangements for the past 2 months: Single Family Home Expected Discharge Date: 04/04/21                                   Social Determinants of Health (SDOH) Interventions  No SDOH interventions requested or needed at this time. Readmission Risk Interventions No flowsheet data found.

## 2021-04-07 NOTE — Progress Notes (Signed)
    BRIEF OVERNIGHT PROGRESS REPORT  Rapid Response was notified by floor staff. Notified by RN for concern over patient expelling an amount of "green mucous" type matter after medication administration and "moaning". Patient is on 3L [place] with HR 63 and SPO2 of 100, RR is 18. Patient is vocalizing and appears to be relaxed and awake. Patient is NPO ABG ordered for Rapid Response event. X-ray and ABG pending.   [name] MSNA ACNPC-AG Acute Care Nurse Practitioner [facility] Pager [phone] [place]

## 2021-04-07 NOTE — Plan of Care (Signed)
  Problem: Health Behavior/Discharge Planning: Goal: Ability to manage health-related needs will improve Outcome: Progressing   Problem: Clinical Measurements: Goal: Ability to maintain clinical measurements within normal limits will improve Outcome: Progressing Goal: Will remain free from infection Outcome: Progressing Goal: Diagnostic test results will improve Outcome: Progressing Goal: Respiratory complications will improve Outcome: Progressing   

## 2021-04-07 NOTE — Progress Notes (Signed)
Unavailable for EEG at this time. Pt will be transferring to 4E. Will attempt later when schedule permits

## 2021-04-07 NOTE — Progress Notes (Signed)
PROGRESS NOTE    Sheri [name]  AJO:[id number] DOB: [birth year] DOA: 03/30/2021 PCP: Sheri Rakers, MD   Brief Narrative:  87 year old F with PMH of COPD and chronic RF on 3 L, HTN, DM-2, T11 compression fracture and osteoarthritis presenting with altered mental status for 2 days.  Patient has had a fall 4 days prior to admission.  Per patient's daughter, patient had recurrent falls over the last 4 months with leg giving out.  No history of dementia.   In ED, VBG with acute on chronic respiratory acidosis with hypercapnia. CT head and cervical spine, DG left foot, DG left ankle, b/l knees, thoracic/lumbar spine without acute finding. Patient was admitted for acute metabolic encephalopathy likely from hypercapnia in the setting of COPD exacerbation.  Started on BiPAP and COPD pathway. MRI brain ordered but patient could not tolerate.  Anesthesia did not feel MRI under anesthesia is the safest thing to do at this time given his COPD exacerbation.  Patient's mental status improved but seems to have delirium now.    Therapy recommended SNF.  TOC working on this.  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  CT head without acute finding but cerebral atrophy with chronic microvascular changes.  Basic encephalopathy work-up unrevealing except for mildly elevated ammonia and positive RPR.  Overall, encephalopathy improved after treatment of hypercapnia but seemed to have waxing and waning mental status concerning for delirium.  Overnight rapid response was called since patient was yelling that she could not breathe.  She was seen by nurse practitioner.  She was  requiring only 2 L of oxygen.  She was having green mucus.  She was bumped to 3 L.  According to the note, she was relaxed and awake.  When I saw her this morning, patient was very lethargic although easily arousable.  When awake, she had significantly dysarthric speech and could not follow any commands.  This is a significant change compared to yesterday where she was at least alert and oriented x1.  This raises suspicious for possible stroke.  During this hospitalization, MRI was ordered but could not be done due to her COPD exacerbation.  I have reordered stat MRI for her.  We will try to see if we can get it done.  ABGs were also done which shows hypercarbia with respiratory acidosis.  Patient will be moved to progressive care unit and started on BiPAP and repeat ABGs in few hours. -Manage COPD as below -Avoid or minimize sedating medications -Low RPR titer with positive T. pallidum Ab.  Daughter does not even know what syphilis is and believes that patient has never been tested positive or treated for this.  Discussed with Dr. Earlene [name] of ID who recommends Bicillin 2,400,000 units IM weekly x3 doses. -PT/OT/SLP eval and treatment  Acute on chronic RF with hypercapnia due to COPD exacerbation: Initial VBG with acute on chronic respiratory acidosis and hypercapnia to 88.  Improved. -Completed antibiotic and steroid course. -Continue inhalers and nebulizers -Aspiration precautions She will be on BiPAP again.  Recurrent falls-reportedly knees giving out. Uses walker at baseline.  No acute finding on imaging. -Voltaren gel and scheduled Tylenol -Fall precautions -Continue PT/OT  Essential hypertension/pulmonary hypertension: BP fairly  controlled. -Continue home verapamil, metoprolol, HCTZ.  Added scheduled hydralazine here as well as as needed hydralazine.  History of dysphagia: Reportedly had esophageal stretching in the past.  Currently doing well.  Chronic T11 compression fracture:  slightly progressed to about 50% height loss but no retropulsion.  She has no back pain or neurodeficit.  Controlled NIDDM-2 with mild hyperglycemia: A1c 5.9%.  Blood sugar controlled.  Continue SSI and Tradjenta.  Hypokalemia/hypophosphatemia/hypomagnesemia: Low potassium.  Will replace.  Hyponatremia: Acute hyponatremia, 126.  Monitor closely every 6 hours.  CKD-3A: Creatinine at her baseline.  Normocytic anemia: H&H stable.  Anemia panel basically normal.  Heterogenicity of thyroid gland -No follow-up needed given age  Goal of care: 87 years of age with COPD and chronic respiratory failure.   Previous hospitalist had discussed the pros and cons of CPR and intubation with patient's daughter, [place] over the phone who prefers to keep full CODE STATUS for now.  Nutrition Body mass index is 25.97 kg/m. Nutrition Problem: Increased nutrient needs Etiology: chronic illness (COPD) Signs/Symptoms: estimated needs Interventions: Ensure Enlive (each supplement provides 350kcal and 20 grams of protein),MVI    DVT prophylaxis: enoxaparin (LOVENOX) injection 40 mg Start: 04/03/21 1430   Code Status: Full Code  Family Communication: None present at bedside.  Discussed with daughter over the phone.  Status is: Inpatient  Remains inpatient appropriate because:Unsafe d/c plan   Dispo: The patient is from: Home              Anticipated d/c is to: SNF              Patient currently is medically not stable to d/c.   Difficult to place patient No        Estimated body mass index is 25.44 kg/m as calculated from the following:   Height as of this encounter: 5\' 2"  (1.575 m).   Weight as of this encounter: 63.1 kg.      Nutritional status:  Nutrition Problem: Increased nutrient needs Etiology: chronic illness (COPD)   Signs/Symptoms: estimated needs   Interventions: Ensure Enlive (each supplement provides 350kcal and 20 grams of protein),MVI    Consultants:    None  Procedures:   None  Antimicrobials:  Anti-infectives (From admission, onward)   Start     Dose/Rate Route Frequency Ordered Stop   04/12/21 1000  penicillin g benzathine (BICILLIN LA) 1200000 UNIT/2ML injection 2.4 Million Units        2.4 Million Units Intramuscular Weekly 04/06/21 1018 04/26/21 0959   04/06/21 1100  penicillin g benzathine (BICILLIN LA) 1200000 UNIT/2ML injection 2.4 Million Units  Status:  Discontinued        2.4 Million Units Intramuscular Weekly 04/06/21 1012 04/06/21 1018   04/05/21 1100  penicillin g procaine-penicillin g benzathine (BICILLIN-CR) injection 600000-600000 units        2.4 Million Units Intramuscular  Once 04/05/21 0949 04/05/21 1511   04/01/21 0400  cefTRIAXone (ROCEPHIN) 1 g in sodium chloride 0.9 % 100 mL IVPB  Status:  Discontinued        1 g 200 mL/hr over 30 Minutes Intravenous Every 24 hours 03/31/21 0424 04/04/21 2132   03/31/21 0430  cefTRIAXone (ROCEPHIN) 1 g in sodium chloride 0.9 % 100 mL IVPB        1 g 200 mL/hr over 30 Minutes Intravenous  Once 03/31/21 0426 03/31/21 0511         Subjective: Seen and examined.  Events from overnight noted.  Patient was lethargic but  easily arousable.  Once awake, she had significantly dysarthric speech and was unable to follow commands.  Objective: Vitals:   04/06/21 2344 04/06/21 2350 04/07/21 0527 04/07/21 0900  BP: 138/64 (!) 152/76 (!) 154/63 134/62  Pulse: 62 69 64 (!) 54  Resp:   19 20  Temp:   (!) 97.2 F (36.2 C) 98.4 F (36.9 C)  TempSrc:   Oral Axillary  SpO2: 92% 100% 94%   Weight:   63.1 kg   Height:        Intake/Output Summary (Last 24 hours) at 04/07/2021 1013 Last data filed at 04/07/2021 0200 Gross per 24 hour  Intake 297 ml  Output --  Net 297 ml   Filed Weights   03/30/21 1957 04/03/21 0915 04/07/21 0527  Weight: 64.4 kg 64.4 kg 63.1 kg    Examination:  General exam: Appears lethargic with dysarthria. Respiratory system: Clear to auscultation.  Respiratory effort normal. Cardiovascular system: S1 & S2 heard, RRR. No JVD, murmurs, rubs, gallops or clicks. No pedal edema. Gastrointestinal system: Abdomen is nondistended, soft and nontender. No organomegaly or masses felt. Normal bowel sounds heard. Central nervous system: Lethargic and disoriented with dysarthria and unable to follow commands.    Data Reviewed: I have personally reviewed following labs and imaging studies  CBC: Recent Labs  Lab 04/01/21 0403 04/02/21 0408 04/07/21 0407  WBC 10.2 8.0 11.7*  HGB 8.8* 9.9* 10.8*  HCT 28.3* 31.6* 33.6*  MCV 88.2 87.3 84.2  PLT 327 333 430*   Basic Metabolic Panel: Recent Labs  Lab 04/01/21 0403 04/02/21 0408 04/03/21 0647 04/07/21 0407  NA 139 137 135 126*  K 4.1 3.6 4.2 3.4*  CL 94* 94* 94* 85*  CO2 34* 33* 34* 34*  GLUCOSE 124* 116* 116* 142*  BUN CREATININE 1.10* 0.97 0.91 1.01*  CALCIUM 9.0 9.2 8.5* 9.1  MG  --  1.7 2.0 2.0  PHOS  --  1.6* 2.5  --    GFR: Estimated Creatinine Clearance: 31 mL/min (A) (by C-G formula based on SCr of 1.01 mg/dL (H)). Liver Function Tests: Recent Labs  Lab 04/01/21 0403 04/02/21 0408 04/03/21 0647  AST 26  --   --   ALT 13  --   --   ALKPHOS 40  --   --   BILITOT 0.5  --   --   PROT 6.3*  --   --   ALBUMIN 2.5* 2.7* 2.6*   No results for input(s): LIPASE, AMYLASE in the last 168 hours. Recent Labs  Lab 04/01/21 1358 04/02/21 0408  AMMONIA 47* 24   Coagulation Profile: No results for input(s): INR, PROTIME in the last 168 hours. Cardiac Enzymes: Recent Labs  Lab 04/02/21 0408 04/03/21 0647  CKTOTAL 246* 105   BNP (last 3 results) No results for input(s): PROBNP in the last 8760 hours. HbA1C: No results for input(s): HGBA1C in the last 72 hours. CBG: Recent Labs  Lab 04/05/21 1620 04/05/21 2124 04/06/21 0650 04/06/21 1621 04/07/21 0521  GLUCAP 114* 131* 110* 123* 113*   Lipid Profile: No results for input(s): CHOL, HDL, LDLCALC,  TRIG, CHOLHDL, LDLDIRECT in the last 72 hours. Thyroid Function Tests: No results for input(s): TSH, T4TOTAL, FREET4, T3FREE, THYROIDAB in the last 72 hours. Anemia Panel: No results for input(s): VITAMINB12, FOLATE, FERRITIN, TIBC, IRON, RETICCTPCT in the last 72 hours. Sepsis Labs: No results for input(s): PROCALCITON, LATICACIDVEN in the last 168 hours.  Recent Results (from the past 240  hour(s))  Resp Panel by RT-PCR (Flu A&B, Covid) Nasopharyngeal Swab     Status: None   Collection Time: 03/30/21  8:18 PM   Specimen: Nasopharyngeal Swab; Nasopharyngeal(NP) swabs in vial transport medium  Result Value Ref Range Status   SARS Coronavirus 2 by RT PCR NEGATIVE NEGATIVE Final    Comment: (NOTE) SARS-CoV-2 target nucleic acids are NOT DETECTED.  The SARS-CoV-2 RNA is generally detectable in upper respiratory specimens during the acute phase of infection. The lowest concentration of SARS-CoV-2 viral copies this assay can detect is 138 copies/mL. A negative result does not preclude SARS-Cov-2 infection and should not be used as the sole basis for treatment or other patient management decisions. A negative result may occur with  improper specimen collection/handling, submission of specimen other than nasopharyngeal swab, presence of viral mutation(s) within the areas targeted by this assay, and inadequate number of viral copies(<138 copies/mL). A negative result must be combined with clinical observations, patient history, and epidemiological information. The expected result is Negative.  Fact Sheet for Patients:  BloggerCourse.com  Fact Sheet for Healthcare Providers:  SeriousBroker.it  This test is no t yet approved or cleared by the [place] FDA and  has been authorized for detection and/or diagnosis of SARS-CoV-2 by FDA under an Emergency Use Authorization (EUA). This EUA will remain  in effect (meaning this test can be  used) for the duration of the COVID-19 declaration under Section 564(b)(1) of the Act, 21 U.S.C.section 360bbb-3(b)(1), unless the authorization is terminated  or revoked sooner.       Influenza A by PCR NEGATIVE NEGATIVE Final   Influenza B by PCR NEGATIVE NEGATIVE Final    Comment: (NOTE) The Xpert Xpress SARS-CoV-2/FLU/RSV plus assay is intended as an aid in the diagnosis of influenza from Nasopharyngeal swab specimens and should not be used as a sole basis for treatment. Nasal washings and aspirates are unacceptable for Xpert Xpress SARS-CoV-2/FLU/RSV testing.  Fact Sheet for Patients: BloggerCourse.com  Fact Sheet for Healthcare Providers: SeriousBroker.it  This test is not yet approved or cleared by the [place] FDA and has been authorized for detection and/or diagnosis of SARS-CoV-2 by FDA under an Emergency Use Authorization (EUA). This EUA will remain in effect (meaning this test can be used) for the duration of the COVID-19 declaration under Section 564(b)(1) of the Act, 21 U.S.C. section 360bbb-3(b)(1), unless the authorization is terminated or revoked.  Performed at [facility], [address], [place], [place] [address]   Surgical pcr screen     Status: None   Collection Time: 04/02/21 11:30 PM   Specimen: Nasal Mucosa; Nasal Swab  Result Value Ref Range Status   MRSA, PCR NEGATIVE NEGATIVE Final   Staphylococcus aureus NEGATIVE NEGATIVE Final    Comment: (NOTE) The Xpert SA Assay (FDA approved for NASAL specimens in patients 12 years of age and older), is one component of a comprehensive surveillance program. It is not intended to diagnose infection nor to guide or monitor treatment. Performed at [facility], [address] [place], [place] [address]   Resp Panel by RT-PCR (Flu A&B, Covid) Nasopharyngeal Swab     Status: None   Collection Time: 04/06/21  1:26 PM   Specimen:  Nasopharyngeal Swab; Nasopharyngeal(NP) swabs in vial transport medium  Result Value Ref Range Status   SARS Coronavirus 2 by RT PCR NEGATIVE NEGATIVE Final    Comment: (NOTE) SARS-CoV-2 target nucleic acids are NOT DETECTED.  The SARS-CoV-2 RNA is generally detectable  in upper respiratory specimens during the acute phase of infection. The lowest concentration of SARS-CoV-2 viral copies this assay can detect is 138 copies/mL. A negative result does not preclude SARS-Cov-2 infection and should not be used as the sole basis for treatment or other patient management decisions. A negative result may occur with  improper specimen collection/handling, submission of specimen other than nasopharyngeal swab, presence of viral mutation(s) within the areas targeted by this assay, and inadequate number of viral copies(<138 copies/mL). A negative result must be combined with clinical observations, patient history, and epidemiological information. The expected result is Negative.  Fact Sheet for Patients:  BloggerCourse.com  Fact Sheet for Healthcare Providers:  SeriousBroker.it  This test is no t yet approved or cleared by the [place] FDA and  has been authorized for detection and/or diagnosis of SARS-CoV-2 by FDA under an Emergency Use Authorization (EUA). This EUA will remain  in effect (meaning this test can be used) for the duration of the COVID-19 declaration under Section 564(b)(1) of the Act, 21 U.S.C.section 360bbb-3(b)(1), unless the authorization is terminated  or revoked sooner.       Influenza A by PCR NEGATIVE NEGATIVE Final   Influenza B by PCR NEGATIVE NEGATIVE Final    Comment: (NOTE) The Xpert Xpress SARS-CoV-2/FLU/RSV plus assay is intended as an aid in the diagnosis of influenza from Nasopharyngeal swab specimens and should not be used as a sole basis for treatment. Nasal washings and aspirates are unacceptable for  Xpert Xpress SARS-CoV-2/FLU/RSV testing.  Fact Sheet for Patients: BloggerCourse.com  Fact Sheet for Healthcare Providers: SeriousBroker.it  This test is not yet approved or cleared by the [place] FDA and has been authorized for detection and/or diagnosis of SARS-CoV-2 by FDA under an Emergency Use Authorization (EUA). This EUA will remain in effect (meaning this test can be used) for the duration of the COVID-19 declaration under Section 564(b)(1) of the Act, 21 U.S.C. section 360bbb-3(b)(1), unless the authorization is terminated or revoked.  Performed at [facility], [address] [place], [place] [address]       Radiology Studies: DG CHEST PORT 1 VIEW  Result Date: 04/06/2021 CLINICAL DATA:  Altered mental status, respiratory distress EXAM: PORTABLE CHEST 1 VIEW COMPARISON:  03/30/2021 FINDINGS: The lungs are mildly hyperinflated, similar to that noted on prior examination, in keeping with changes of underlying COPD. The lungs are clear. No pneumothorax or pleural effusion. Cardiac size is mildly enlarged, unchanged. Pulmonary vascularity is normal. IMPRESSION: No active disease.  COPD Electronically Signed   By: Helyn Numbers MD   On: 04/06/2021 22:40    Scheduled Meds: . acetaminophen  500 mg Oral Q8H  . diclofenac Sodium  2 g Topical QID  . enoxaparin (LOVENOX) injection  40 mg Subcutaneous Q24H  . feeding supplement  237 mL Oral TID BM  . ferrous sulfate  325 mg Oral Q breakfast  . fluticasone furoate-vilanterol  1 puff Inhalation Daily   And  . umeclidinium bromide  1 puff Inhalation Daily  . guaiFENesin  600 mg Oral BID  . hydrALAZINE  25 mg Oral Q8H  . hydrochlorothiazide  12.5 mg Oral Daily  . latanoprost  1 drop Right Eye QHS  . linagliptin  5 mg Oral Daily  . mouth rinse  15 mL Mouth Rinse BID  . metoprolol succinate  50 mg Oral Daily  . mirtazapine  7.5 mg Oral QHS  . multivitamin with  minerals  1 tablet Oral Daily  . pantoprazole  40 mg Oral Daily  . [START ON 04/12/2021] penicillin g benzathine (BICILLIN-LA) IM  2.4 Million Units Intramuscular Weekly  . rosuvastatin  20 mg Oral QHS  . verapamil  240 mg Oral QHS   Continuous Infusions: . albuterol Stopped (03/31/21 0400)  . potassium chloride 10 mEq (04/07/21 0955)     LOS: 7 days   Time spent: 35 minutes   Hughie Closs, MD [facility]  04/07/2021, 10:13 AM   How to contact the [facility] Attending or Consulting provider 7A - 7P or covering provider during after hours 7P -7A, for this patient?  1. Check the care team in [facility] and look for a) attending/consulting TRH provider listed and b) the [facility] team listed. Page or secure chat 7A-7P. 2. Log into [email or web address] and use [place]'s universal password to access. If you do not have the password, please contact the hospital operator. 3. Locate the [facility] provider you are looking for under [facility] and page to a number that you can be directly reached. 4. If you still have difficulty reaching the provider, please page the [facility] (Director on Call) for the Hospitalists listed on amion for assistance.

## 2021-04-07 NOTE — Progress Notes (Signed)
Attempted to call for report, NS said charge nurse is still  reviewing patient.

## 2021-04-07 NOTE — Progress Notes (Signed)
OT Cancellation Note  Patient Details Name: Sheri [name] MRN: [id number] DOB: 1926-08-04   Cancelled Treatment:    Reason Eval/Treat Not Completed: Medical issues which prohibited therapy;Other (comment) Per char review, pt is having increased secretions and SOB concerning for aspiration, noted pt to move units, will hold off on OT session. Will check back as pt medically appropriate.   Pollyann Glen K., COTA/L Acute Rehabilitation Services [phone]   Barron Schmid 04/07/2021, 1:18 PM

## 2021-04-07 NOTE — Progress Notes (Signed)
Critical ABG values given to [place], Charity fundraiser.

## 2021-04-07 NOTE — Progress Notes (Signed)
RN was notified by NT that the patient was yelling out that she couldn't breath. RN went to assess patient, vitals stable see flow sheet. SATS 97% on 2L. RN administered PRN breathing tx due to patient reporting SOB. RN auscultated lung fields and patients sounds diminished in all areas. PT also has a wet gurgling cough and seems unable to clear her upper air way secretions. Will continue to monitor.

## 2021-04-07 NOTE — Progress Notes (Signed)
Patient transferred to 4E. Dentures was given to daughter Sheri [name]).

## 2021-04-07 NOTE — Plan of Care (Signed)
  Problem: Coping: Goal: Level of anxiety will decrease Outcome: Not Progressing   

## 2021-04-07 NOTE — Progress Notes (Signed)
RN and NT were changing pt and noticed black liquid stool on pt's bed pad. Pt starting to experience labored breathing  with gurgling secretions &  cough. PT was just starring into space and required me calling her names multiple of times before she responded. PT has been confused all night and speech is very difficult to understand. I sat patient straight up and she coughed up a large amount of green thick secretions; I worry about this patient may be aspirateting. RN called NP to report finding along with rapid response. Will continue to monitor.

## 2021-04-07 NOTE — Progress Notes (Signed)
PT Cancellation Note  Patient Details Name: Sheri [name] MRN: [id number] DOB: 03/19/1926   Cancelled Treatment:    Reason Eval/Treat Not Completed: (P) Medical issues which prohibited therapy Pt is having increased secretions and SoB concerning for aspiration. RN request PT hold off this morning. PT will follow back for this afternoon to determine if pt is appropriate.   Oyinkansola Truax B. Beverely Risen PT, DPT Acute Rehabilitation Services Pager [phone] Office [phone]    Elon Alas [facility] 04/07/2021, 8:57 AM

## 2021-04-08 ENCOUNTER — Telehealth: Payer: Self-pay | Admitting: Pulmonary Disease

## 2021-04-08 ENCOUNTER — Inpatient Hospital Stay (HOSPITAL_COMMUNITY): Payer: Medicare Other

## 2021-04-08 LAB — CBC WITH DIFFERENTIAL/PLATELET
Abs Immature Granulocytes: 0.05 10*3/uL (ref 0.00–0.07)
Basophils Absolute: 0 10*3/uL (ref 0.0–0.1)
Basophils Relative: 0 %
Eosinophils Absolute: 0.2 10*3/uL (ref 0.0–0.5)
Eosinophils Relative: 2 %
HCT: 32.9 % — ABNORMAL LOW (ref 36.0–46.0)
Hemoglobin: 10.6 g/dL — ABNORMAL LOW (ref 12.0–15.0)
Immature Granulocytes: 1 %
Lymphocytes Relative: 18 %
Lymphs Abs: 1.5 10*3/uL (ref 0.7–4.0)
MCH: 27.7 pg (ref 26.0–34.0)
MCHC: 32.2 g/dL (ref 30.0–36.0)
MCV: 85.9 fL (ref 80.0–100.0)
Monocytes Absolute: 0.9 10*3/uL (ref 0.1–1.0)
Monocytes Relative: 11 %
Neutro Abs: 5.7 10*3/uL (ref 1.7–7.7)
Neutrophils Relative %: 68 %
Platelets: 417 10*3/uL — ABNORMAL HIGH (ref 150–400)
RBC: 3.83 MIL/uL — ABNORMAL LOW (ref 3.87–5.11)
RDW: 15.1 % (ref 11.5–15.5)
WBC: 8.4 10*3/uL (ref 4.0–10.5)
nRBC: 0 % (ref 0.0–0.2)

## 2021-04-08 LAB — COMPREHENSIVE METABOLIC PANEL
ALT: 13 U/L (ref 0–44)
AST: 26 U/L (ref 15–41)
Albumin: 2.8 g/dL — ABNORMAL LOW (ref 3.5–5.0)
Alkaline Phosphatase: 51 U/L (ref 38–126)
Anion gap: 8 (ref 5–15)
BUN: 16 mg/dL (ref 8–23)
CO2: 33 mmol/L — ABNORMAL HIGH (ref 22–32)
Calcium: 9.1 mg/dL (ref 8.9–10.3)
Chloride: 89 mmol/L — ABNORMAL LOW (ref 98–111)
Creatinine, Ser: 1.13 mg/dL — ABNORMAL HIGH (ref 0.44–1.00)
GFR, Estimated: 46 mL/min — ABNORMAL LOW (ref >60)
Glucose, Bld: 87 mg/dL (ref 70–99)
Potassium: 4.4 mmol/L (ref 3.5–5.1)
Sodium: 130 mmol/L — ABNORMAL LOW (ref 135–145)
Total Bilirubin: 0.7 mg/dL (ref 0.3–1.2)
Total Protein: 6.5 g/dL (ref 6.5–8.1)

## 2021-04-08 LAB — GLUCOSE, CAPILLARY
Glucose-Capillary: 114 mg/dL — ABNORMAL HIGH (ref 70–99)
Glucose-Capillary: 115 mg/dL — ABNORMAL HIGH (ref 70–99)
Glucose-Capillary: 134 mg/dL — ABNORMAL HIGH (ref 70–99)
Glucose-Capillary: 83 mg/dL (ref 70–99)
Glucose-Capillary: 86 mg/dL (ref 70–99)
Glucose-Capillary: 94 mg/dL (ref 70–99)

## 2021-04-08 MED ORDER — LACTATED RINGERS IV SOLN: INTRAVENOUS | Status: DC

## 2021-04-08 MED ORDER — INSULIN ASPART 100 UNIT/ML IJ SOLN
0.0000 [IU] | INTRAMUSCULAR | Status: DC
  Administered 2021-04-08 – 2021-04-09 (×3): 1 [IU] via SUBCUTANEOUS
  Administered 2021-04-09: 2 [IU] via SUBCUTANEOUS
  Administered 2021-04-10 – 2021-04-19 (×6): 1 [IU] via SUBCUTANEOUS

## 2021-04-08 MED ORDER — ENOXAPARIN SODIUM 30 MG/0.3ML IJ SOSY
30.0000 mg | PREFILLED_SYRINGE | INTRAMUSCULAR | Status: DC
  Administered 2021-04-08 – 2021-04-20 (×13): 30 mg via SUBCUTANEOUS
  Filled 2021-04-08 (×13): qty 0.3

## 2021-04-08 NOTE — Progress Notes (Signed)
Nutrition Follow-up  DOCUMENTATION CODES:  Not applicable  INTERVENTION:  Continue to advance diet per SLP.  Discontinue Ensure Enlive TID.  Add Mighty Shake II TID with meals, each supplement provides 480-500 kcals and 20-23 grams of protein.  Provide feeding assistance with all meals.  Continue MVI with minerals daily.  NUTRITION DIAGNOSIS:  Increased nutrient needs related to chronic illness (COPD) as evidenced by estimated needs. - onoging  GOAL:  Patient will meet greater than or equal to 90% of their needs - not meeting  MONITOR:  PO intake,Supplement acceptance  REASON FOR ASSESSMENT:  Consult COPD Protocol  ASSESSMENT:  87 yo female admitted with AMS, COPD exacerbation. PMH includes COPD on 3 L oxygen at home, HTN. 6/8 - SLP recommends Dys 3 with nectar thickened liquids  Pt medically stable to discharge. Awaiting discharge to SNF.  Pt unavailable at time of RD visit.  Per Epic, pt only eating 0-25% of meals - averaging about 10% of last 8 documented meals.  Admit wt: 64.4 kg Current wt: 63.1 kg  Recommend trying Mighty Shakes TID to promote intake and discontinuing Ensure Enlive TID as well as feeding assistance with meals. Continue to advance diet as medically able per SLP.  Medications: reviewed; EE TID, ferrous sulfate, SSI, Remeron, MVI with minerals, Protonix, LR @ 75 ml/hr  Labs: reviewed; Na 130, CBG 83-100 HbA1c: 5.9% (04/2021)  Diet Order:   Diet Order            DIET DYS 3 Room service appropriate? No; Fluid consistency: Nectar Thick  Diet effective now           Diet general                EDUCATION NEEDS:  No education needs have been identified at this time  Skin:  Skin Assessment: Reviewed RN Assessment  Last BM:  04/07/21 - Type 7, medium  Height:  Ht Readings from Last 1 Encounters:  04/03/21 5\' 2"  (1.575 m)   Weight:  Wt Readings from Last 1 Encounters:  04/07/21 63.1 kg   Ideal Body Weight:  50 kg  BMI:  Body mass  index is 25.44 kg/m.  Estimated Nutritional Needs:  Kcal:  1550-1750 Protein:  80-90 gm Fluid:  >/= 1.7 L  06/07/21, RD, LDN Registered Dietitian I After-Hours/Weekend Pager # in [facility]

## 2021-04-08 NOTE — Procedures (Signed)
Patient Name: Sheri [name]  MRN: [id number]  Epilepsy Attending: Charlsie Quest  Referring Physician/Provider: Dr Hughie Closs Date: 04/08/2021  Duration: 23.21 mins  Patient history: 87yo F with ams. EEG to evaluate for seizure  Level of alertness: Awake  AEDs during EEG study: None  Technical aspects: This EEG study was done with scalp electrodes positioned according to the 10-20 International system of electrode placement. Electrical activity was acquired at a sampling rate of 500Hz  and reviewed with a high frequency filter of 70Hz  and a low frequency filter of 1Hz . EEG data were recorded continuously and digitally stored.   Description: The posterior dominant rhythm consists of 8 Hz activity of moderate voltage (25-35 uV) seen predominantly in posterior head regions, symmetric and reactive to eye opening and eye closing. EEG showed continuous generalized 3 to 6 Hz theta-delta slowing. Hyperventilation and photic stimulation were not performed.     ABNORMALITY - Continuous slow, generalized  IMPRESSION: This study is suggestive of mild to moderate diffuse encephalopathy, nonspecific etiology. No seizures or epileptiform discharges were seen throughout the recording.  [name] 

## 2021-04-08 NOTE — Telephone Encounter (Signed)
I have scheduled scheduled with Beth, NP 04/27/21 at 1100, for hospital follow up per Dr. Lanora Manis request.  ATC Marcial Pacas, Patient son at [phone], spoke with his significant other Steward Drone (Tim's DPR).  Steward Drone is aware of appointment scheduling and stated she would have Tim call office to confirm Patients OV time. Patient, at this time, is still admitted at [facility]. Nile [phone].   Will route message to [place], CMA as fyi/follow up

## 2021-04-08 NOTE — Progress Notes (Signed)
PROGRESS NOTE    Sheri [name]  ZOX:[id number] DOB: [birth year] DOA: 03/30/2021 PCP: Sheri [name]   Brief Narrative:  87 year old F with PMH of COPD and chronic RF on 3 L, HTN, DM-2, T11 compression fracture and osteoarthritis presenting with altered mental status for 2 days.  Patient has had a fall 4 days prior to admission.  Per patient's daughter, patient had recurrent falls over the last 4 months with leg giving out.  No history of dementia.   In ED, VBG with acute on chronic respiratory acidosis with hypercapnia. CT head and cervical spine, DG left foot, DG left ankle, b/l knees, thoracic/lumbar spine without acute finding. Patient was admitted for acute metabolic encephalopathy likely from hypercapnia in the setting of COPD exacerbation.  Started on BiPAP and COPD pathway. MRI brain ordered but patient could not tolerate.  Anesthesia did not feel MRI under anesthesia is the safest thing to do at this time given his COPD exacerbation.  Patient's mental status improved but seems to have delirium now.    Therapy recommended SNF.  TOC working on this.  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  CT head without acute finding but cerebral atrophy with chronic microvascular changes.  Basic encephalopathy work-up unrevealing except for mildly elevated ammonia and positive RPR.  Overall, encephalopathy improved after treatment of hypercapnia but seemed to have waxing and waning mental status concerning for delirium.  On the night of 04/06/2021, rapid response was called since patient was yelling that she could not breathe.  She was seen by nurse  practitioner.  She was requiring only 2 L of oxygen.  She was having green mucus.  She was bumped to 3 L.  According to the note, she was relaxed and awake.  Early morning 04/07/2021, she was completely lethargic with slurred speech raising suspicion for possible stroke.  MRI and EEG were ordered but not done that day due to her condition.  ABGs were done which once again showed hypercarbia.  She was transferred to progressive care unit and started on BiPAP.  Repeat ABGs yesterday after 2 hours showed improvement in CO2.  When seen this morning, she was off of BiPAP and only on 3 L oxygen and saturating 100%.  She was fully alert and oriented.  Denied any shortness of breath.  She was being evaluated by speech therapist.  EEG is done and it shows no epileptiform activities.  MRI is pending and I will still go ahead and do it just to make sure there is no stroke involved.  Now that she has recurrent encephalopathy secondary to hypercarbia, I wonder if she is a candidate for permanent and regular NIV.  It appears that she had seen Sheri [name] of PCCM in 2019 and was diagnosed with moderate to severe COPD.  I have consulted PCCM to evaluate her for possible NIV. -Avoid or minimize sedating medications -Low RPR titer with positive T. pallidum Ab.  Daughter does not even know what syphilis is and believes that patient has never been tested positive or treated for this.  Discussed with Sheri [name] of ID who recommends Bicillin 2,400,000 units IM weekly x3 doses. -PT/OT/SLP eval and treatment  Acute on chronic  RF with hypercapnia due to COPD exacerbation: Initial VBG showed hypercapnia. -Completed antibiotic and steroid course. -Continue inhalers and nebulizers -Aspiration precautions  Recurrent falls-reportedly knees giving out. Uses walker at baseline.  No acute finding on imaging. -Voltaren gel and scheduled Tylenol -Fall precautions -Continue PT/OT.  They recommend SNF.  Essential hypertension/pulmonary  hypertension: BP fairly controlled. -Continue home verapamil, metoprolol, HCTZ.  Added scheduled hydralazine here as well as as needed hydralazine.  History of dysphagia: Reportedly had esophageal stretching in the past.  Seen by SLP, currently on dysphagia 3 diet.  Modified barium swallow pending.  Chronic T11 compression fracture: slightly progressed to about 50% height loss but no retropulsion.  She has no back pain or neurodeficit.  Controlled NIDDM-2 with mild hyperglycemia: A1c 5.9%.  Blood sugar controlled.  Continue SSI and Tradjenta.  Hypokalemia/hypophosphatemia/hypomagnesemia: Low potassium.  Will replace.  Acute hyponatremia, improved from 126 yesterday to 130 today.  CKD-3A: Creatinine at her baseline.  Normocytic anemia: H&H stable.  Anemia panel basically normal.  Heterogenicity of thyroid gland -No follow-up needed given age  Goal of care: 87 years of age with COPD and chronic respiratory failure.   Previous hospitalist had discussed the pros and cons of CPR and intubation with patient's daughter, [name] over the phone who prefers to keep full CODE STATUS for now.  Nutrition Body mass index is 25.97 kg/m. Nutrition Problem: Increased nutrient needs Etiology: chronic illness (COPD) Signs/Symptoms: estimated needs Interventions: Ensure Enlive (each supplement provides 350kcal and 20 grams of protein),MVI    DVT prophylaxis: enoxaparin (LOVENOX) injection 40 mg Start: 04/03/21 1430   Code Status: Full Code  Family Communication: None present at bedside.  Discussed with daughter over the phone.  Status is: Inpatient  Remains inpatient appropriate because:Unsafe d/c plan   Dispo: The patient is from: Home              Anticipated d/c is to: SNF              Patient currently is medically stable to d/c.   Difficult to place patient No        Estimated body mass index is 25.44 kg/m as calculated from the following:   Height as of this  encounter: 5\' 2"  (1.575 m).   Weight as of this encounter: 63.1 kg.      Nutritional status:  Nutrition Problem: Increased nutrient needs Etiology: chronic illness (COPD)   Signs/Symptoms: estimated needs   Interventions: Ensure Enlive (each supplement provides 350kcal and 20 grams of protein),MVI    Consultants:   PCCM  Procedures:   None  Antimicrobials:  Anti-infectives (From admission, onward)   Start     Dose/Rate Route Frequency Ordered Stop   04/12/21 1000  penicillin g benzathine (BICILLIN LA) 1200000 UNIT/2ML injection 2.4 Million Units        2.4 Million Units Intramuscular Weekly 04/06/21 1018 04/26/21 0959   04/06/21 1100  penicillin g benzathine (BICILLIN LA) 1200000 UNIT/2ML injection 2.4 Million Units  Status:  Discontinued        2.4 Million Units Intramuscular Weekly 04/06/21 1012 04/06/21 1018   04/05/21 1100  penicillin g procaine-penicillin g benzathine (BICILLIN-CR) injection 600000-600000 units        2.4 Million Units Intramuscular  Once 04/05/21 0949 04/05/21 1511   04/01/21 0400  cefTRIAXone (ROCEPHIN) 1 g in sodium chloride 0.9 % 100 mL IVPB  Status:  Discontinued        1 g 200 mL/hr over 30 Minutes Intravenous Every 24  hours 03/31/21 0424 04/04/21 2132   03/31/21 0430  cefTRIAXone (ROCEPHIN) 1 g in sodium chloride 0.9 % 100 mL IVPB        1 g 200 mL/hr over 30 Minutes Intravenous  Once 03/31/21 0426 03/31/21 0511         Subjective: Seen and examined this morning.  Off of BiPAP.  Only on 3 L oxygen.  Doing well.  Fully alert and oriented.  Denies any complaint.  Objective: Vitals:   04/07/21 2321 04/08/21 0308 04/08/21 0446 04/08/21 0810  BP:   (!) 123/52 134/62  Pulse: 64 68 63 64  Resp: Temp:   98.2 F (36.8 C) 98.4 F (36.9 C)  TempSrc:   Oral Axillary  SpO2: 100% 100% 100% 100%  Weight:      Height:        Intake/Output Summary (Last 24 hours) at 04/08/2021 1033 Last data filed at 04/08/2021 0816 Gross per 24  hour  Intake --  Output 200 ml  Net -200 ml   Filed Weights   03/30/21 1957 04/03/21 0915 04/07/21 0527  Weight: 64.4 kg 64.4 kg 63.1 kg    Examination:  General exam: Appears calm and comfortable  Respiratory system: Diminished breath sounds bilaterally. Respiratory effort normal. Cardiovascular system: S1 & S2 heard, RRR. No JVD, murmurs, rubs, gallops or clicks. No pedal edema. Gastrointestinal system: Abdomen is nondistended, soft and nontender. No organomegaly or masses felt. Normal bowel sounds heard. Central nervous system: Alert and oriented. No focal neurological deficits. Extremities: Symmetric 5 x 5 power. Skin: No rashes, lesions or ulcers.  Psychiatry: Judgement and insight appear poor   Data Reviewed: I have personally reviewed following labs and imaging studies  CBC: Recent Labs  Lab 04/02/21 0408 04/07/21 0407 04/08/21 0732  WBC 8.0 11.7* 8.4  NEUTROABS  --   --  5.7  HGB 9.9* 10.8* 10.6*  HCT 31.6* 33.6* 32.9*  MCV 87.3 84.2 85.9  PLT 333 430* 417*   Basic Metabolic Panel: Recent Labs  Lab 04/02/21 0408 04/03/21 0647 04/07/21 0407 04/08/21 0732  NA 137 135 126* 130*  K 3.6 4.2 3.4* 4.4  CL 94* 94* 85* 89*  CO2 33* 34* 34* 33*  GLUCOSE 116* 116* 142* 87  BUN CREATININE 0.97 0.91 1.01* 1.13*  CALCIUM 9.2 8.5* 9.1 9.1  MG 1.7 2.0 2.0  --   PHOS 1.6* 2.5  --   --    GFR: Estimated Creatinine Clearance: 27.7 mL/min (A) (by C-G formula based on SCr of 1.13 mg/dL (H)). Liver Function Tests: Recent Labs  Lab 04/02/21 0408 04/03/21 0647 04/08/21 0732  AST  --   --  26  ALT  --   --  13  ALKPHOS  --   --  51  BILITOT  --   --  0.7  PROT  --   --  6.5  ALBUMIN 2.7* 2.6* 2.8*   No results for input(s): LIPASE, AMYLASE in the last 168 hours. Recent Labs  Lab 04/01/21 1358 04/02/21 0408  AMMONIA 47* 24   Coagulation Profile: No results for input(s): INR, PROTIME in the last 168 hours. Cardiac Enzymes: Recent Labs  Lab  04/02/21 0408 04/03/21 0647  CKTOTAL 246* 105   BNP (last 3 results) No results for input(s): PROBNP in the last 8760 hours. HbA1C: No results for input(s): HGBA1C in the last 72 hours. CBG: Recent Labs  Lab 04/07/21 1549 04/07/21 2021 04/07/21  2338 04/08/21 0443 04/08/21 0804  GLUCAP 83 92 100* 86 83   Lipid Profile: No results for input(s): CHOL, HDL, LDLCALC, TRIG, CHOLHDL, LDLDIRECT in the last 72 hours. Thyroid Function Tests: No results for input(s): TSH, T4TOTAL, FREET4, T3FREE, THYROIDAB in the last 72 hours. Anemia Panel: No results for input(s): VITAMINB12, FOLATE, FERRITIN, TIBC, IRON, RETICCTPCT in the last 72 hours. Sepsis Labs: No results for input(s): PROCALCITON, LATICACIDVEN in the last 168 hours.  Recent Results (from the past 240 hour(s))  Resp Panel by RT-PCR (Flu A&B, Covid) Nasopharyngeal Swab     Status: None   Collection Time: 03/30/21  8:18 PM   Specimen: Nasopharyngeal Swab; Nasopharyngeal(NP) swabs in vial transport medium  Result Value Ref Range Status   SARS Coronavirus 2 by RT PCR NEGATIVE NEGATIVE Final    Comment: (NOTE) SARS-CoV-2 target nucleic acids are NOT DETECTED.  The SARS-CoV-2 RNA is generally detectable in upper respiratory specimens during the acute phase of infection. The lowest concentration of SARS-CoV-2 viral copies this assay can detect is 138 copies/mL. A negative result does not preclude SARS-Cov-2 infection and should not be used as the sole basis for treatment or other patient management decisions. A negative result may occur with  improper specimen collection/handling, submission of specimen other than nasopharyngeal swab, presence of viral mutation(s) within the areas targeted by this assay, and inadequate number of viral copies(<138 copies/mL). A negative result must be combined with clinical observations, patient history, and epidemiological information. The expected result is Negative.  Fact Sheet for Patients:   BloggerCourse.com  Fact Sheet for Healthcare Providers:  SeriousBroker.it  This test is no t yet approved or cleared by the [place] FDA and  has been authorized for detection and/or diagnosis of SARS-CoV-2 by FDA under an Emergency Use Authorization (EUA). This EUA will remain  in effect (meaning this test can be used) for the duration of the COVID-19 declaration under Section 564(b)(1) of the Act, 21 U.S.C.section 360bbb-3(b)(1), unless the authorization is terminated  or revoked sooner.       Influenza A by PCR NEGATIVE NEGATIVE Final   Influenza B by PCR NEGATIVE NEGATIVE Final    Comment: (NOTE) The Xpert Xpress SARS-CoV-2/FLU/RSV plus assay is intended as an aid in the diagnosis of influenza from Nasopharyngeal swab specimens and should not be used as a sole basis for treatment. Nasal washings and aspirates are unacceptable for Xpert Xpress SARS-CoV-2/FLU/RSV testing.  Fact Sheet for Patients: BloggerCourse.com  Fact Sheet for Healthcare Providers: SeriousBroker.it  This test is not yet approved or cleared by the [place] FDA and has been authorized for detection and/or diagnosis of SARS-CoV-2 by FDA under an Emergency Use Authorization (EUA). This EUA will remain in effect (meaning this test can be used) for the duration of the COVID-19 declaration under Section 564(b)(1) of the Act, 21 U.S.C. section 360bbb-3(b)(1), unless the authorization is terminated or revoked.  Performed at [facility], [address] [place], [place] [address]   Surgical pcr screen     Status: None   Collection Time: 04/02/21 11:30 PM   Specimen: Nasal Mucosa; Nasal Swab  Result Value Ref Range Status   MRSA, PCR NEGATIVE NEGATIVE Final   Staphylococcus aureus NEGATIVE NEGATIVE Final    Comment: (NOTE) The Xpert SA Assay (FDA approved for NASAL specimens in patients  25 years of age and older), is one component of a comprehensive surveillance program. It is not intended to diagnose infection nor to guide or monitor treatment.  Performed at [facility], [address] [place], [place] [address]   Resp Panel by RT-PCR (Flu A&B, Covid) Nasopharyngeal Swab     Status: None   Collection Time: 04/06/21  1:26 PM   Specimen: Nasopharyngeal Swab; Nasopharyngeal(NP) swabs in vial transport medium  Result Value Ref Range Status   SARS Coronavirus 2 by RT PCR NEGATIVE NEGATIVE Final    Comment: (NOTE) SARS-CoV-2 target nucleic acids are NOT DETECTED.  The SARS-CoV-2 RNA is generally detectable in upper respiratory specimens during the acute phase of infection. The lowest concentration of SARS-CoV-2 viral copies this assay can detect is 138 copies/mL. A negative result does not preclude SARS-Cov-2 infection and should not be used as the sole basis for treatment or other patient management decisions. A negative result may occur with  improper specimen collection/handling, submission of specimen other than nasopharyngeal swab, presence of viral mutation(s) within the areas targeted by this assay, and inadequate number of viral copies(<138 copies/mL). A negative result must be combined with clinical observations, patient history, and epidemiological information. The expected result is Negative.  Fact Sheet for Patients:  BloggerCourse.com  Fact Sheet for Healthcare Providers:  SeriousBroker.it  This test is no t yet approved or cleared by the [place] FDA and  has been authorized for detection and/or diagnosis of SARS-CoV-2 by FDA under an Emergency Use Authorization (EUA). This EUA will remain  in effect (meaning this test can be used) for the duration of the COVID-19 declaration under Section 564(b)(1) of the Act, 21 U.S.C.section 360bbb-3(b)(1), unless the authorization is terminated  or  revoked sooner.       Influenza A by PCR NEGATIVE NEGATIVE Final   Influenza B by PCR NEGATIVE NEGATIVE Final    Comment: (NOTE) The Xpert Xpress SARS-CoV-2/FLU/RSV plus assay is intended as an aid in the diagnosis of influenza from Nasopharyngeal swab specimens and should not be used as a sole basis for treatment. Nasal washings and aspirates are unacceptable for Xpert Xpress SARS-CoV-2/FLU/RSV testing.  Fact Sheet for Patients: BloggerCourse.com  Fact Sheet for Healthcare Providers: SeriousBroker.it  This test is not yet approved or cleared by the [place] FDA and has been authorized for detection and/or diagnosis of SARS-CoV-2 by FDA under an Emergency Use Authorization (EUA). This EUA will remain in effect (meaning this test can be used) for the duration of the COVID-19 declaration under Section 564(b)(1) of the Act, 21 U.S.C. section 360bbb-3(b)(1), unless the authorization is terminated or revoked.  Performed at [facility], [address], [place], [place] [address]       Radiology Studies: DG CHEST PORT 1 VIEW  Result Date: 04/06/2021 CLINICAL DATA:  Altered mental status, respiratory distress EXAM: PORTABLE CHEST 1 VIEW COMPARISON:  03/30/2021 FINDINGS: The lungs are mildly hyperinflated, similar to that noted on prior examination, in keeping with changes of underlying COPD. The lungs are clear. No pneumothorax or pleural effusion. Cardiac size is mildly enlarged, unchanged. Pulmonary vascularity is normal. IMPRESSION: No active disease.  COPD Electronically Signed   By: Helyn Numbers [name]   On: 04/06/2021 22:40   EEG adult  Result Date: 04/08/2021 Charlsie Quest, [name]     04/08/2021  9:59 AM Patient Name: KAILA DEVRIES MRN: [id number] Epilepsy Attending: Charlsie Quest Referring Physician/Provider: Dr Hughie Closs Date: 04/08/2021 Duration: 23.21 mins Patient history: 87yo F with ams. EEG to evaluate for  seizure Level of alertness: Awake AEDs during EEG study: None Technical aspects: This EEG study was  done with scalp electrodes positioned according to the 10-20 International system of electrode placement. Electrical activity was acquired at a sampling rate of 500Hz  and reviewed with a high frequency filter of 70Hz  and a low frequency filter of 1Hz . EEG data were recorded continuously and digitally stored. Description: The posterior dominant rhythm consists of 8 Hz activity of moderate voltage (25-35 uV) seen predominantly in posterior head regions, symmetric and reactive to eye opening and eye closing. EEG showed continuous generalized 3 to 6 Hz theta-delta slowing. Hyperventilation and photic stimulation were not performed.   ABNORMALITY - Continuous slow, generalized IMPRESSION: This study is suggestive of mild to moderate diffuse encephalopathy, nonspecific etiology. No seizures or epileptiform discharges were seen throughout the recording. Priyanka    Scheduled Meds: . acetaminophen  500 mg Oral Q8H  . diclofenac Sodium  2 g Topical QID  . enoxaparin (LOVENOX) injection  40 mg Subcutaneous Q24H  . feeding supplement  237 mL Oral TID BM  . ferrous sulfate  325 mg Oral Q breakfast  . fluticasone furoate-vilanterol  1 puff Inhalation Daily   And  . umeclidinium bromide  1 puff Inhalation Daily  . guaiFENesin  600 mg Oral BID  . hydrALAZINE  25 mg Oral Q8H  . hydrochlorothiazide  12.5 mg Oral Daily  . insulin aspart  0-9 Units Subcutaneous Q4H  . latanoprost  1 drop Right Eye QHS  . linagliptin  5 mg Oral Daily  . mouth rinse  15 mL Mouth Rinse BID  . metoprolol succinate  50 mg Oral Daily  . mirtazapine  7.5 mg Oral QHS  . multivitamin with minerals  1 tablet Oral Daily  . pantoprazole  40 mg Oral Daily  . [START ON 04/12/2021] penicillin g benzathine (BICILLIN-LA) IM  2.4 Million Units Intramuscular Weekly  . rosuvastatin  20 mg Oral QHS  . verapamil  240 mg Oral QHS   Continuous  Infusions: . albuterol Stopped (03/31/21 0400)  . lactated ringers       LOS: 8 days   Time spent: 31 minutes   Annabelle Harman, [name] [facility]  04/08/2021, 10:33 AM   How to contact the [facility] Attending or Consulting provider 7A - 7P or covering provider during after hours 7P -7A, for this patient?  1. Check the care team in [facility] and look for a) attending/consulting TRH provider listed and b) the [facility] team listed. Page or secure chat 7A-7P. 2. Log into [email or web address] and use [place]'s universal password to access. If you do not have the password, please contact the hospital operator. 3. Locate the [facility] provider you are looking for under [facility] and page to a number that you can be directly reached. 4. If you still have difficulty reaching the provider, please page the [facility] (Director on Call) for the Hospitalists listed on amion for assistance.

## 2021-04-08 NOTE — Progress Notes (Signed)
EEG Completed; Results Pending  

## 2021-04-08 NOTE — Progress Notes (Signed)
Occupational Therapy Treatment Patient Details Name: Sheri [name] MRN: [id number] DOB: 1926/09/21 Today's Date: 04/08/2021    History of present illness Pt is a 87 year old woman admitted on 03/31/21 with AMS, COPD exacerbation  and falls. Pt injured her L ankle/foot and hit her head in a fall. Head CT negative, MRI pending. PMH: COPD on 3L at home, t11 compression fx, HTN, AAA.   OT comments  Pt progressing well towards OT goals and able to meet self feeding, grooming task and bed mobility goals during session today. Pt received after completion of EEG and after transition from BiPAP to 3 L O2. Pt overall min guard for bed mobility, Mod A for taking steps to recliner with RW (though pt noted to pick RW up off of floor). Pt able to demo good sitting balance during various ADLs and swallow assessment with SLP EOB. Pt continues to require Max A for LB ADLs due to poor balance in standing with plans to further address in next sessions. Continue to recommend SNF.  SpO2 100% on 2-3 L O2.    Follow Up Recommendations  SNF;Supervision/Assistance - 24 hour    Equipment Recommendations  3 in 1 bedside commode    Recommendations for Other Services      Precautions / Restrictions Precautions Precautions: Fall Precaution Comments: frequent falls at home (2-3x week); modified diet Restrictions Weight Bearing Restrictions: No       Mobility Bed Mobility Overal bed mobility: Needs Assistance Bed Mobility: Supine to Sit     Supine to sit: Min guard;HOB elevated     General bed mobility comments: min guard with increased time to sit EOB    Transfers Overall transfer level: Needs assistance Equipment used: Rolling walker (2 wheeled) Transfers: Sit to/from [employer] Sit to Stand: Min assist Stand pivot transfers: Mod assist       General transfer comment: Min A for power up at bedside and Mod A overall for taking steps to recliner. Assist to advance RW (pt noted to  pick RW up) and to maintain balance    Balance Overall balance assessment: Needs assistance Sitting-balance support: No upper extremity supported;Feet supported Sitting balance-Leahy Scale: Fair     Standing balance support: Bilateral upper extremity supported Standing balance-Leahy Scale: Poor Standing balance comment: reliant on UE support in standing                           ADL either performed or assessed with clinical judgement   ADL Overall ADL's : Needs assistance/impaired Eating/Feeding: Independent;Sitting Eating/Feeding Details (indicate cue type and reason): able to open cracker packaging, feed self applesauce and hold cup to drink from it sitting EOB. With SLP present to assess swallowing Grooming: Set up;Sitting;Wash/dry face                       Toileting- Clothing Manipulation and Hygiene: Maximal assistance;Sit to/from stand Toileting - Clothing Manipulation Details (indicate cue type and reason): Max A for peri care after purewick malfunction sitting EOB with smear noted on bed pad       General ADL Comments: Pt with improving sequencing and awareness with simple ADLs, continues to require increased assist for standing ADLs due to poor balance/safety in standing     Vision   Vision Assessment?: No apparent visual deficits   Perception     Praxis      Cognition Arousal/Alertness: Awake/alert Behavior During Therapy: Flat  affect Overall Cognitive Status: Impaired/Different from baseline Area of Impairment: Memory;Following commands;Safety/judgement;Awareness;Problem solving                     Memory: Decreased short-term memory Following Commands: Follows one step commands with increased time Safety/Judgement: Decreased awareness of safety;Decreased awareness of deficits Awareness: Emergent Problem Solving: Slow processing;Decreased initiation;Difficulty sequencing;Requires verbal cues;Requires tactile cues General  Comments: improving mentation, able to answer orientation questions and follow directions. able to be redirected to tasks well though decreased awareness of functional deficits. demo awareness of need for dentures for food, etc        Exercises     Shoulder Instructions       General Comments SpO2 100% on 3 L O2, reduced to 2 L O2 at end of session. Had recently been taken off of BiPAP for EEG (who were finishing up on OT entry)    Pertinent Vitals/ Pain       Pain Assessment: No/denies pain  Home Living                                          Prior Functioning/Environment              Frequency  Min 2X/week        Progress Toward Goals  OT Goals(current goals can now be found in the care plan section)  Progress towards OT goals: Progressing toward goals  Acute Rehab OT Goals Patient Stated Goal: get up in the chair OT Goal Formulation: With family Time For Goal Achievement: 04/15/21 Potential to Achieve Goals: Fair ADL Goals Pt Will Perform Eating: with min assist;sitting Pt Will Perform Grooming: with supervision;standing Pt Will Perform Upper Body Dressing: with mod assist;sitting Pt Will Transfer to Toilet: with mod assist;stand pivot transfer;bedside commode Additional ADL Goal #1: Pt will perform bed mobility with min guard assist in preparation for ADL. Additional ADL Goal #2: Pt will demonstrate sustained attention as a precursor to ADL.  Plan Discharge plan remains appropriate    Co-evaluation    PT/OT/SLP Co-Evaluation/Treatment: Yes Reason for Co-Treatment: Necessary to address cognition/behavior during functional activity (functional tasks with swallowing, ADLs and balance EOB)   OT goals addressed during session: ADL's and self-care SLP goals addressed during session: Swallowing    AM-PAC OT "6 Clicks" Daily Activity     Outcome Measure   Help from another person eating meals?: A Little Help from another person taking  care of personal grooming?: A Little Help from another person toileting, which includes using toliet, bedpan, or urinal?: A Lot Help from another person bathing (including washing, rinsing, drying)?: A Lot Help from another person to put on and taking off regular upper body clothing?: A Lot Help from another person to put on and taking off regular lower body clothing?: A Lot 6 Click Score: 14    End of Session Equipment Utilized During Treatment: Gait belt;Rolling walker  OT Visit Diagnosis: Pain;Muscle weakness (generalized) (M62.81);Unsteadiness on feet (R26.81);Other symptoms and signs involving cognitive function   Activity Tolerance Patient tolerated treatment well   Patient Left in chair;with call bell/phone within reach;Other (comment) (OT called out via call bell for chair pad with pt sitting EOB though no assistance available - notified RN/NT)   Nurse Communication Mobility status;Other (comment) (O2)        Time: 6270-3500 OT Time Calculation (min): 33 min  Charges:  OT General Charges $OT Visit: 1 Visit OT Treatments $Self Care/Home Management : 8-22 mins  Bradd Canary, OTR/L Acute Rehab Services Office: [phone]   Lorre Munroe 04/08/2021, 11:28 AM

## 2021-04-08 NOTE — Consult Note (Signed)
NAME:  Sheri [name], MRN:  [id number], DOB:  Jan 18, 1926, LOS: 8 ADMISSION DATE:  03/30/2021, CONSULTATION DATE:  04/08/2021 REFERRING MD:  Dr. Jacqulyn BathPahwani, CHIEF COMPLAINT:  Hypercarbic respiratory failure  History of Present Illness:   87 year old female with prior history of former smoker (quit 19 yrs ago), COPD, chronic hypoxic respiratory failure on 3L home O2, dysphagia, DMT2, and HTN [facility] with acute COPD exacerbation and acute on chronic hypoxic/ hypercarbic respiratory failure with acute metabolic encephalopathy.  Also noted to have a fall 4 days prior to admission with family reporting recurrent falls over the last 4 months due to leg giving out.  She was a patient of Dr. Vassie LollAlva, last seen in our office on 03/09/2018 by Rubye Oaksammy Parrett, NP.  Previous spirometry 02/06/2018 showing FEV1 of 52%, FVC of 102%, and FEV1/FVC 40% and maintained on Symbicort and Spirivia on nocturnal 2L O2.  Since, appears her COPD has been managed by Dr. Amada JupiterKirkpatrick, pulmonary/ IM in [place].  On chart review, most previous AECOPDs in 10/2020, 06/2020, 04/2020, 03/2020 and 01/2020- all seen in the office, but not requiring hospitalization.  She was treated appropriately with steroids, oxygen, BiPAP intermittently, nebs and empiric ceftriaxone. Workup for fall showing chronic T11 compression fracture.  CTH and EEG has been negative.  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  Started on bicillin per ID recs.  MRI attempted however patient unable to tolerate and given current AECOPD, anesthesia did not feel like sedating her was safe at this time.  TRH has asked PCCM to consult to see if patient would be a candiate for home NIV given  recurrent hypercarbia with encephalopathy.  CXR have remained clear, has been afebrile and WBC trend normal.  She denies any fevers, no change in her normal baseline slightly productive cough of whitish phlegm.     Pertinent  Medical History  Former smoker, COPD, chronic hypoxic respiratory failure on 3L home O2, HTN, DMT2, osteoarthritis, dysphagia s/p prior esophageal stretching, AAA  Uses walker at baseline  Significant Hospital Events: Including procedures, antibiotic start and stop dates in addition to other pertinent events   . [facility], acute metabolic encephalopathy   Interim History / Subjective:  MBS just completed Patient without complaints. Daughter at bedside.   Objective   Blood pressure 134/62, pulse 64, temperature 98.4 F (36.9 C), temperature source Axillary, resp. rate 16, height 5\' 2"  (1.575 m), weight 63.1 kg, SpO2 100 %.    FiO2 (%):  [30 %] 30 %   Intake/Output Summary (Last 24 hours) at 04/08/2021 1051 Last data filed at 04/08/2021 0816 Gross per 24 hour  Intake --  Output 200 ml  Net -200 ml   Filed Weights   03/30/21 1957 04/03/21 0915 04/07/21 0527  Weight: 64.4 kg 64.4 kg 63.1 kg    Examination: General:  Elderly female lying in bed in NAD, talking on phone with family at bedside HEENT: MM pink/moist, cataracts  Neuro: Alert, conversant, f/c, MAE, speech is mumbled at times, oriented to person, place, slightly confused to date/year  CV: NSR, +murmur at apex PULM:  Non labored, diminished t/o, no wheeze, on RA at 88, up to 98% on 2L GI: soft, bs+, NT  Extremities: warm/dry, no LE edema  Skin: no rashes   Labs/imaging that I havepersonally reviewed  (right click and "Reselect all SmartList Selections" daily)  ABGs, CBC, BMET, CXR, imaging MBS 6/8->Dysphagia, pharyngoesophageal phase >> recs to continue dysphagia 3 diet, mild to moderate aspiration risk   Resolved Hospital Problem list    Assessment & Plan:   AECOPD Acute on  chronic hypoxic/ hypercarbic respiratory failure  Dysphagia  Aspiration risk  - titrate O2 for sat goal > 88% - ambulatory sats with PT for home O2 requirements, previously just on home O2 at night - continue breo and incruse for now with prn albuterol, likely d/c back to her home Breztri on discharge with prn albuterol  - pt/ daughter wanting to follow back up with [place] in our office, will schedule outpt follow-up prior to d/c. - MBS as above - some concern given her dysphagia and mild to mod aspiration risk with the use of BiPAP, but thus far there is no evidence for aspiration, CXR remains clear, afebrile.  Will educate on use of aspiration precautions and avoid eating for several hours prior to bedtime/ NIV use.   Patient continues to exhibit signs of hypercarbia associated with chronic respiratory failure secondary to severe COPD and chronic hypoxic/ hypercarbic respiratory failure.  Patient requires the use of NIV both nightly and daytime to help with exacerbation periods.  The use of NIV will treat the patient's high PCO2 levels and can reduce risk of exacerbations and future hospitalizations when used at night and during the day.  Patient will need these advanced settings in conjunction with their current medication regimen.  BiPAP is not an option due to its functional limitations and the severity of the patient's condition.  Failure to have NIV available for use over 24-hour period could lead to death.  Patient's ABG exhibited on 5/7 showed 7.290/ 70.4/ 130/ 32.8 on 3 L nasal cannula with ABG values as confirming PCO2 of to greater than 52.  After BiPAP, repeat ABG 7.357/ 55.3/ 90.4/ 30.5 still meeting PCO2 value of > 52.    Remainder per primary team.    Labs   CBC: Recent Labs  Lab 04/02/21 0408 04/07/21 0407 04/08/21 0732  WBC 8.0 11.7* 8.4  NEUTROABS  --   --  5.7  HGB 9.9* 10.8* 10.6*  HCT 31.6* 33.6* 32.9*  MCV 87.3 84.2 85.9  PLT 333 430* 417*    Basic Metabolic  Panel: Recent Labs  Lab 04/02/21 0408 04/03/21 0647 04/07/21 0407 04/08/21 0732  NA 137 135 126* 130*  K 3.6 4.2 3.4* 4.4  CL 94* 94* 85* 89*  CO2 33* 34* 34* 33*  GLUCOSE 116* 116* 142* 87  BUN 16 10 18 16   CREATININE 0.97 0.91 1.01* 1.13*  CALCIUM 9.2 8.5* 9.1 9.1  MG 1.7 2.0 2.0  --   PHOS 1.6* 2.5  --   --    GFR: Estimated Creatinine Clearance: 27.7 mL/min (A) (by C-G formula based on SCr of 1.13 mg/dL (H)). Recent Labs  Lab 04/02/21 0408 04/07/21 0407 04/08/21 0732  WBC 8.0 11.7* 8.4    Liver Function Tests: Recent Labs  Lab 04/02/21 0408 04/03/21 0647 04/08/21 0732  AST  --   --  26  ALT  --   --  13  ALKPHOS  --   --  51  BILITOT  --   --  0.7  PROT  --   --  6.5  ALBUMIN 2.7* 2.6* 2.8*   No results for input(s): LIPASE, AMYLASE  in the last 168 hours. Recent Labs  Lab 04/01/21 1358 04/02/21 0408  AMMONIA 47* 24    ABG    Component Value Date/Time   PHART 7.357 04/07/2021 1550   PCO2ART 55.3 (H) 04/07/2021 1550   PO2ART 90.4 04/07/2021 1550   HCO3 30.5 (H) 04/07/2021 1550   TCO2 38 (H) 03/31/2021 0500   ACIDBASEDEF 2.0 01/04/2015 1347   O2SAT 96.7 04/07/2021 1550     Coagulation Profile: No results for input(s): INR, PROTIME in the last 168 hours.  Cardiac Enzymes: Recent Labs  Lab 04/02/21 0408 04/03/21 0647  CKTOTAL 246* 105    HbA1C: Hgb A1c MFr Bld  Date/Time Value Ref Range Status  04/03/2021 06:47 AM 5.9 (H) 4.8 - 5.6 % Final    Comment:    (NOTE)         Prediabetes: 5.7 - 6.4         Diabetes: >6.4         Glycemic control for adults with diabetes: <7.0     CBG: Recent Labs  Lab 04/07/21 1549 04/07/21 2021 04/07/21 2338 04/08/21 0443 04/08/21 0804  GLUCAP 83 92 100* 86 83    Review of Systems:   Per HPI, otherwise negative.   Past Medical History:  She,  has a past medical history of Bronchitis, COPD (chronic obstructive pulmonary disease) (HCC), and Hypertension.   Surgical History:   Past Surgical  History:  Procedure Laterality Date  . EYE SURGERY    . TUBAL LIGATION       Social History:   reports that she quit smoking about 19 years ago. Her smoking use included cigarettes. She smoked 0.50 packs per day. She has never used smokeless tobacco. She reports that she does not drink alcohol and does not use drugs.   Family History:  Her family history includes Breast cancer in an other family member; Heart attack in her father and mother.   Allergies Allergies  Allergen Reactions  . Arlice Colt Isothiocyanate] Hives     Home Medications  Prior to Admission medications   Medication Sig Start Date End Date Taking? Authorizing Provider  amLODipine (NORVASC) 5 MG tablet Take 5 mg by mouth daily.   Yes [provider]  ferrous sulfate 325 (65 FE) MG tablet Take 325 mg by mouth daily with breakfast. 01/23/19  Yes [provider]  gabapentin (NEURONTIN) 100 MG capsule Take 100 mg by mouth 2 (two) times a day. 02/08/19  Yes [provider]  hydrochlorothiazide (MICROZIDE) 12.5 MG capsule TAKE 1 CAPSULE BY MOUTH EVERY DAY Patient taking differently: Take 12.5 mg by mouth daily. 12/17/20  Yes Patwardhan, Manish J, MD  JANUVIA 100 MG tablet Take 100 mg by mouth daily.  02/20/19  Yes [provider]  latanoprost (XALATAN) 0.005 % ophthalmic solution Place 1 drop into the right eye at bedtime. 02/19/21  Yes [provider]  metoprolol succinate (TOPROL-XL) 50 MG 24 hr tablet TAKE 1 TABLET (50 MG TOTAL) BY MOUTH DAILY. TAKE WITH OR IMMEDIATELY FOLLOWING A MEAL. Patient taking differently: Take 50 mg by mouth daily. 10/15/20 01/13/21 Yes Patwardhan, Manish J, MD  mirtazapine (REMERON) 7.5 MG tablet Take 7.5 mg by mouth at bedtime.  01/16/18  Yes [provider]  OXYGEN Inhale 3 L into the lungs continuous.   Yes [provider]  pantoprazole (PROTONIX) 40 MG tablet Take 40 mg by mouth daily.   Yes [provider]  rosuvastatin  (CRESTOR) 20 MG tablet Take 20  mg by mouth at bedtime. 07/20/20  Yes [provider]  SYMBICORT 160-4.5 MCG/ACT inhaler Inhale 2 puffs into the lungs 2 (two) times daily.  01/30/18  Yes [provider]  verapamil (CALAN-SR) 240 MG CR tablet TAKE 1 TABLET BY MOUTH AT BEDTIME. Patient taking differently: Take 240 mg by mouth at bedtime. 01/22/21  Yes Patwardhan, Anabel Bene, MD  Vitamin D, Ergocalciferol, (DRISDOL) 1.25 MG (50000 UT) CAPS capsule Take 50,000 Units by mouth every Monday. 02/13/19  Yes [provider]  BREZTRI AEROSPHERE 160-9-4.8 MCG/ACT AERO Inhale 1 puff into the lungs at bedtime. 04/04/21   Almon Hercules, MD  COMBIVENT RESPIMAT 20-100 MCG/ACT AERS respimat Inhale 1 puff into the lungs every 6 (six) hours as needed for wheezing or shortness of breath. 04/04/21   Almon Hercules, MD  diclofenac Sodium (VOLTAREN) 1 % GEL Apply 4 g topically 4 (four) times daily. Patient not taking: Reported on 03/31/2021 10/28/20   Melene Plan, DO  Tiotropium Bromide Monohydrate (SPIRIVA RESPIMAT) 1.25 MCG/ACT AERS Inhale 2 puffs into the lungs daily. Patient not taking: Reported on 03/31/2021 02/06/18   Oretha Milch, MD            Posey Boyer, ACNP [facility] 04/08/2021, 3:28 PM

## 2021-04-08 NOTE — Progress Notes (Signed)
  Speech Language Pathology Treatment: Dysphagia  Patient Details Name: Sheri [name] MRN: [id number] DOB: [birth year] Today's Date: 04/08/2021 Time: 2458-0998 SLP Time Calculation (min) (ACUTE ONLY): 8 min  Assessment / Plan / Recommendation Clinical Impression  Pt seen with OT for cotreatment to assess swallow function and assist with positioning during self feeding tasks. Pt sitting on edge of bed with OT. Per nursing pt required Bipap usage and diet was made NPO during Bipap use. Pt has had fluctuation with swallowing function per chart review including episodic coughing and times without overt difficulty. This date assessed pt with thin liquids by cup, pt with consecutive swallows following by overt immediate and delayed coughing and wet vocal quality concerning for decreased airway protection. Single cup sip of thin liquids exhibited delayed cough. Trials of nectar thick by cup were without overt s/sx of aspiration. Pt with dentures at bedside, states did not want to use until daughter brings denture adhesive. Prolonged mastication noted with solids without denture use and pt stated discomfort. Recommend nectar thick and mechanical soft consistencies with meds crushed in puree and MBSS this date. MBSS planned 13:00.       HPI HPI: Pt is a 87 year old woman admitted on 03/31/21 with AMS, COPD exacerbation  and falls. Pt injured her L ankle/foot and hit her head in a fall. Head CT negative, CXR on admission negative. Esophageal dysphagia reported by pt's daughter to referring MD. Esophagram 08/06/19: Esophageal dysmotility, likely presbyesophagus. Small hiatal hernia with mild stricture as evidenced by underdistention at the gastroesophageal junction. PMH: COPD on 3L at home, t11 compression fx, HTN, AAA.      SLP Plan  MBS       Recommendations  Diet recommendations: Dysphagia 3 (mechanical soft);Nectar-thick liquid Liquids provided via: Cup;Straw Medication Administration: Crushed with  puree Supervision: Staff to assist with self feeding;Full supervision/cueing for compensatory strategies Compensations: Minimize environmental distractions;Slow rate;Small sips/bites Postural Changes and/or Swallow Maneuvers: Seated upright 90 degrees;Upright 30-60 min after meal                Oral Care Recommendations: Oral care BID Follow up Recommendations:  (TBD) SLP Visit Diagnosis: Dysphagia, unspecified (R13.10) Plan: MBS       GO                Sheri [name] H. MA, CCC-SLP Acute Rehabilitation Services   04/08/2021, 10:07 AM

## 2021-04-08 NOTE — Progress Notes (Signed)
Pt alert and had diet ordered after Speech therapist seen her. Hold LR maintain fluid. MD aware.   Lawson Radar, RN

## 2021-04-08 NOTE — Telephone Encounter (Signed)
Please schedule patient for hospital follow up with one of our NP's for COPD and chronic hypercapnic/hypoxemic respiratory failure. If the appointment can be made on 04/27/21 around 11:15am as her son has appointment Dr. Everardo All on this day at 11:15am. It would be more convenient for them to come to the clinic around similar times.  Thanks, Cletis Athens

## 2021-04-08 NOTE — Progress Notes (Addendum)
Was able to wean pt off from BiPAP per MD ordered. Pt's 02 sat >90% at 3L. Will continue to monitor the pt.   Lawson Radar, RN

## 2021-04-08 NOTE — Progress Notes (Signed)
RT NOTES: Pt found off of bipap at this time. No distress noted. Will continue to monitor.

## 2021-04-08 NOTE — Progress Notes (Signed)
Physical Therapy Treatment Patient Details Name: Sheri [name] MRN: [id number] DOB: [birth year] Today's Date: 04/08/2021    History of Present Illness Pt is a 87 year old woman admitted on 03/31/21 with AMS, COPD exacerbation  and falls. Pt injured her L ankle/foot and hit her head in a fall. Head CT negative, MRI pending. PMH: COPD on 3L at home, T11 compression fx, HTN, AAA.    PT Comments    Pt received in chair, agreeable to therapy session and with good participation and tolerance for transfer training and short gait task at bedside. Pt with difficulty managing RW, may consider cane vs quad cane for stability next session to see if this is less cumbersome for pt. Pt needing up to +2 modA to perform functional mobility tasks and VSS on 2L O2 [place]. Up in bed with alarm on and IV team entering room at end of session. Pt continues to benefit from PT services to progress toward functional mobility goals. Continue to recommend SNF.   Follow Up Recommendations  SNF;Supervision/Assistance - 24 hour     Equipment Recommendations  Other (comment);None recommended by PT (defer to post-acute)    Recommendations for Other Services       Precautions / Restrictions Precautions Precautions: Fall Precaution Comments: frequent falls at home (2-3x week); modified diet Restrictions Weight Bearing Restrictions: No    Mobility  Bed Mobility Overal bed mobility: Needs Assistance Bed Mobility: Sit to Supine     Supine to sit: Min guard;HOB elevated Sit to supine: Min assist;+2 for physical assistance   General bed mobility comments: assist needed for BLE to clear EOB, increased time to perform, minA for trunk guidance    Transfers Overall transfer level: Needs assistance Equipment used: Rolling walker (2 wheeled) Transfers: Sit to/from [employer] Sit to Stand: Min assist Stand pivot transfers: Mod assist;+2 safety/equipment       General transfer comment: Min A for power  up at bedside and Mod A overall for taking steps to recliner. Assist to advance RW (pt noted to pick RW up) and to maintain balance, second person present also providing min guard but mostly needs +1 physical assist  Ambulation/Gait Ambulation/Gait assistance: Mod assist;+2 safety/equipment Gait Distance (Feet): 8 Feet Assistive device: Rolling walker (2 wheeled) Gait Pattern/deviations: Step-to pattern;Decreased step length - right;Decreased step length - left;Trunk flexed Gait velocity: slowed Gait velocity interpretation: <1.8 ft/sec, indicate of risk for recurrent falls General Gait Details: stepping in a circle at EOB (due to lines), distance limited due to pt fatigue and IV team entering to place IV.   Stairs             Wheelchair Mobility    Modified Rankin (Stroke Patients Only)       Balance Overall balance assessment: Needs assistance Sitting-balance support: No upper extremity supported;Feet supported Sitting balance-Leahy Scale: Fair     Standing balance support: Bilateral upper extremity supported Standing balance-Leahy Scale: Poor Standing balance comment: reliant on UE support in standing, pt at times picking up RW (confusion) +1-2 A for safety but only needs +1 physical support                            Cognition Arousal/Alertness: Awake/alert Behavior During Therapy: Flat affect Overall Cognitive Status: Impaired/Different from baseline Area of Impairment: Memory;Following commands;Safety/judgement;Awareness;Problem solving                     Memory: Decreased  short-term memory Following Commands: Follows one step commands with increased time Safety/Judgement: Decreased awareness of safety;Decreased awareness of deficits Awareness: Emergent Problem Solving: Slow processing;Decreased initiation;Difficulty sequencing;Requires verbal cues;Requires tactile cues General Comments: improving mentation, able to answer orientation  questions and follow directions. able to be redirected to tasks well though decreased awareness of functional deficits. slow processing of 1-step commands and difficulty following 2-step commands.      Exercises      General Comments General comments (skin integrity, edema, etc.): SpO2 93-100% on 2L O2 ; HR 70's bpm and BP 114/58 (75) seated prior to standing      Pertinent Vitals/Pain Pain Assessment: No/denies pain    Home Living                      Prior Function            PT Goals (current goals can now be found in the care plan section) Acute Rehab PT Goals Patient Stated Goal: none stated (limited session back to bed so IV team can place IV) PT Goal Formulation: Patient unable to participate in goal setting Time For Goal Achievement: 04/15/21 Potential to Achieve Goals: Fair Progress towards PT goals: Progressing toward goals    Frequency    Min 2X/week      PT Plan Current plan remains appropriate    Co-evaluation   Reason for Co-Treatment: Necessary to address cognition/behavior during functional activity (functional tasks with swallowing, ADLs and balance EOB)   OT goals addressed during session: ADL's and self-care SLP goals addressed during session: Swallowing    AM-PAC PT "6 Clicks" Mobility   Outcome Measure  Help needed turning from your back to your side while in a flat bed without using bedrails?: A Little Help needed moving from lying on your back to sitting on the side of a flat bed without using bedrails?: A Lot Help needed moving to and from a bed to a chair (including a wheelchair)?: A Lot Help needed standing up from a chair using your arms (e.g., wheelchair or bedside chair)?: A Little Help needed to walk in hospital room?: A Lot Help needed climbing 3-5 steps with a railing? : Total 6 Click Score: 13    End of Session Equipment Utilized During Treatment: Oxygen;Gait belt Activity Tolerance: Patient tolerated treatment  well;Other (comment) (session limited due to IV team arrival) Patient left: in bed;with call bell/phone within reach;with bed alarm set;Other (comment) (heels floated, BUE resting on pillows for support) Nurse Communication: Mobility status PT Visit Diagnosis: Unsteadiness on feet (R26.81);Other abnormalities of gait and mobility (R26.89);Repeated falls (R29.6);Muscle weakness (generalized) (M62.81);Difficulty in walking, not elsewhere classified (R26.2);History of falling (Z91.81);Pain Pain - Right/Left: Left Pain - part of body: Ankle and joints of foot     Time: 1133-1141 PT Time Calculation (min) (ACUTE ONLY): 8 min  Charges:  $Therapeutic Activity: 8-22 mins                     Sundra Haddix P., PTA Acute Rehabilitation Services Pager: [phone] Office: [phone]   Angus Palms 04/08/2021, 1:44 PM

## 2021-04-08 NOTE — TOC Progression Note (Signed)
Transition of Care [facility]) - Progression Note    Patient Details  Name: Sheri [name] MRN: [id number] Date of Birth: [birth year]  Transition of Care [facility]) CM/SW Contact  Eduard Roux, [place] Phone Number: 04/08/2021, 5:19 PM  Clinical Narrative:     [facility]/SNF advised patient will need bipap- setting where provided- they will order bipap tomorrow -anticipate d/c tomorrow ,pending they can get the bipap Patient auth review was today- insurance advised good until tomorrow  Another covid test not needed  Antony Blackbird, MSW, LCSW Clinical Social Worker   Expected Discharge Plan: Skilled Nursing Facility Barriers to Discharge: Insurance Authorization,SNF Pending bed offer  Expected Discharge Plan and Services Expected Discharge Plan: Skilled Nursing Facility     Post Acute Care Choice: Skilled Nursing Facility Living arrangements for the past 2 months: Single Family Home Expected Discharge Date: 04/04/21                                     Social Determinants of Health (SDOH) Interventions    Readmission Risk Interventions No flowsheet data found.

## 2021-04-08 NOTE — TOC Progression Note (Addendum)
Transition of Care (TOC) - Progression Note  Donn Pierini RN, BSN Transitions of Care Unit 4E- RN Case Manager See Treatment Team for direct phone #    Patient Details  Name: Sheri [name] MRN: [id number] Date of Birth: Mar 16, 1926  Transition of Care [facility]) CM/SW Contact  Zenda Alpers Lenn Sink, RN Phone Number: 04/08/2021, 4:17 PM  Clinical Narrative:    Noted referral for NIV and nebulizer needs. Order has been placed for Nebulizer, have spoken with Zach at [employer] for NIV referral- order form for NIV has been placed on shadow chart and will need MD signature in order to start insurance process for approval.  Plan is for pt to go to SNF-  [facility] will f/u regarding NIV approval and follow for home NIV needs if approved. Pt will need Bipap arranged at SNF- CSW aware and to f/u with facility.  [facility] to follow for both NIV and nebulizer needs.   Pt was on 3L baseline 02.    6/9- 1330- NIV order form has been signedMedical illustrator with [employer] will f/u and process to see if insurance will approve- Zach/[employer] aware pt going to [facility] and will follow there- if pt approved for NIV- [facility] will follow at [facility] and arrange for NIV prior to pt return home, nebulizer has been delivered to room here for pt to take with her to SNF.    Expected Discharge Plan: Skilled Nursing Facility Barriers to Discharge: Insurance Authorization,SNF Pending bed offer  Expected Discharge Plan and Services Expected Discharge Plan: Skilled Nursing Facility     Post Acute Care Choice: Skilled Nursing Facility Living arrangements for the past 2 months: Single Family Home Expected Discharge Date: 04/04/21                                     Social Determinants of Health (SDOH) Interventions    Readmission Risk Interventions No flowsheet data found.

## 2021-04-09 ENCOUNTER — Inpatient Hospital Stay (HOSPITAL_COMMUNITY): Payer: Medicare Other

## 2021-04-09 LAB — CBC WITH DIFFERENTIAL/PLATELET
Abs Immature Granulocytes: 0.04 10*3/uL (ref 0.00–0.07)
Basophils Absolute: 0 10*3/uL (ref 0.0–0.1)
Basophils Relative: 0 %
Eosinophils Absolute: 0.2 10*3/uL (ref 0.0–0.5)
Eosinophils Relative: 2 %
HCT: 31.6 % — ABNORMAL LOW (ref 36.0–46.0)
Hemoglobin: 10.2 g/dL — ABNORMAL LOW (ref 12.0–15.0)
Immature Granulocytes: 0 %
Lymphocytes Relative: 25 %
Lymphs Abs: 2.4 10*3/uL (ref 0.7–4.0)
MCH: 27.5 pg (ref 26.0–34.0)
MCHC: 32.3 g/dL (ref 30.0–36.0)
MCV: 85.2 fL (ref 80.0–100.0)
Monocytes Absolute: 1.2 10*3/uL — ABNORMAL HIGH (ref 0.1–1.0)
Monocytes Relative: 13 %
Neutro Abs: 5.8 10*3/uL (ref 1.7–7.7)
Neutrophils Relative %: 60 %
Platelets: 413 10*3/uL — ABNORMAL HIGH (ref 150–400)
RBC: 3.71 MIL/uL — ABNORMAL LOW (ref 3.87–5.11)
RDW: 15.2 % (ref 11.5–15.5)
WBC: 9.6 10*3/uL (ref 4.0–10.5)
nRBC: 0 % (ref 0.0–0.2)

## 2021-04-09 LAB — GLUCOSE, CAPILLARY
Glucose-Capillary: 107 mg/dL — ABNORMAL HIGH (ref 70–99)
Glucose-Capillary: 128 mg/dL — ABNORMAL HIGH (ref 70–99)
Glucose-Capillary: 146 mg/dL — ABNORMAL HIGH (ref 70–99)
Glucose-Capillary: 158 mg/dL — ABNORMAL HIGH (ref 70–99)
Glucose-Capillary: 80 mg/dL (ref 70–99)
Glucose-Capillary: 98 mg/dL (ref 70–99)

## 2021-04-09 LAB — COMPREHENSIVE METABOLIC PANEL
ALT: 16 U/L (ref 0–44)
AST: 26 U/L (ref 15–41)
Albumin: 2.7 g/dL — ABNORMAL LOW (ref 3.5–5.0)
Alkaline Phosphatase: 48 U/L (ref 38–126)
Anion gap: 10 (ref 5–15)
BUN: 22 mg/dL (ref 8–23)
CO2: 32 mmol/L (ref 22–32)
Calcium: 9.1 mg/dL (ref 8.9–10.3)
Chloride: 88 mmol/L — ABNORMAL LOW (ref 98–111)
Creatinine, Ser: 1.37 mg/dL — ABNORMAL HIGH (ref 0.44–1.00)
GFR, Estimated: 36 mL/min — ABNORMAL LOW (ref >60)
Glucose, Bld: 127 mg/dL — ABNORMAL HIGH (ref 70–99)
Potassium: 3.8 mmol/L (ref 3.5–5.1)
Sodium: 130 mmol/L — ABNORMAL LOW (ref 135–145)
Total Bilirubin: 0.5 mg/dL (ref 0.3–1.2)
Total Protein: 6.3 g/dL — ABNORMAL LOW (ref 6.5–8.1)

## 2021-04-09 MED ORDER — PENICILLIN G BENZATHINE 1200000 UNIT/2ML IM SUSY: 2.4000 10*6.[IU] | PREFILLED_SYRINGE | INTRAMUSCULAR | 0 refills | Status: DC

## 2021-04-09 MED ORDER — SODIUM CHLORIDE 0.9 % IV SOLN: Freq: Once | INTRAVENOUS | Status: AC

## 2021-04-09 NOTE — Care Management Important Message (Signed)
Important Message  Patient Details  Name: Sheri [name] MRN: [id number] Date of Birth: 1926/02/28   Medicare Important Message Given:  Yes     Renie Ora 04/09/2021, 12:17 PM

## 2021-04-09 NOTE — TOC Progression Note (Signed)
Transition of Care [facility]) - Progression Note    Patient Details  Name: Sheri [name] MRN: [id number] Date of Birth: Dec 03, 1925  Transition of Care [facility]) CM/SW Contact  Eduard Roux, [place] Phone Number: 04/09/2021, 2:17 PM  Clinical Narrative:     SNF/[facility]  informed CSW - bipap representative unable to provide bipap with the current bipap settings. Patient unable to d/c to SNF  Antony Blackbird, MSW, LCSW Clinical Social Worker    Expected Discharge Plan: Skilled Nursing Facility Barriers to Discharge: English as a second language teacher, SNF Pending bed offer  Expected Discharge Plan and Services Expected Discharge Plan: Skilled Nursing Facility     Post Acute Care Choice: Skilled Nursing Facility Living arrangements for the past 2 months: Single Family Home Expected Discharge Date: 04/09/21                                     Social Determinants of Health (SDOH) Interventions    Readmission Risk Interventions No flowsheet data found.

## 2021-04-09 NOTE — Plan of Care (Signed)
  Problem: Health Behavior/Discharge Planning: Goal: Ability to manage health-related needs will improve Outcome: Progressing   Problem: Clinical Measurements: Goal: Ability to maintain clinical measurements within normal limits will improve Outcome: Progressing Goal: Will remain free from infection Outcome: Progressing Goal: Diagnostic test results will improve Outcome: Progressing Goal: Respiratory complications will improve Outcome: Progressing Goal: Cardiovascular complication will be avoided Outcome: Progressing   Problem: Activity: Goal: Risk for activity intolerance will decrease Outcome: Progressing   Problem: Elimination: Goal: Will not experience complications related to bowel motility Outcome: Progressing Goal: Will not experience complications related to urinary retention Outcome: Progressing   Problem: Pain Managment: Goal: General experience of comfort will improve Outcome: Progressing   Problem: Safety: Goal: Ability to remain free from injury will improve Outcome: Progressing   Problem: Skin Integrity: Goal: Risk for impaired skin integrity will decrease Outcome: Progressing   

## 2021-04-09 NOTE — Progress Notes (Signed)
PROGRESS NOTE    Sheri [name]  ZOX:[id number] DOB: 1925/11/11 DOA: 03/30/2021 PCP: Renaye Rakers, MD   Brief Narrative:  87 year old F with PMH of COPD and chronic RF on 3 L, HTN, DM-2, T11 compression fracture and osteoarthritis presenting with altered mental status for 2 days.  Patient has had a fall 4 days prior to admission.  Per patient's daughter, patient had recurrent falls over the last 4 months with leg giving out.  No history of dementia.    In ED, VBG with acute on chronic respiratory acidosis with hypercapnia. CT head and cervical spine, DG left foot, DG left ankle, b/l knees, thoracic/lumbar spine without acute finding. Patient was admitted for acute metabolic encephalopathy likely from hypercapnia in the setting of COPD exacerbation.  Started on BiPAP and COPD pathway. MRI brain ordered but patient could not tolerate.  Anesthesia did not feel MRI under anesthesia is the safest thing to do at this time given his COPD exacerbation.  Patient's mental status improved but seems to have delirium now.     Therapy recommended SNF.  TOC working on this.  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  CT head without acute finding but cerebral atrophy with chronic microvascular changes.  Basic encephalopathy work-up unrevealing except for mildly elevated ammonia and positive RPR.  Overall, encephalopathy improved after treatment of hypercapnia but seemed to have waxing and waning mental status concerning for delirium.  On the night of 04/06/2021, rapid response was called since patient was yelling that she could not breathe.  She was seen by nurse  practitioner. She was bumped to 3 L. Early morning 04/07/2021, she was completely lethargic with slurred speech raising suspicion for possible stroke.  MRI and EEG were ordered but not done that day due to her condition.  ABGs were done which once again showed hypercarbia.  She was transferred to progressive care unit and started on BiPAP.  Repeat ABGs  showed improvement in CO2.  On the morning of 04/08/2021, she was off of BiPAP and only on 3 L oxygen and saturating 100%.  She was fully alert and oriented. EEG did not show epileptiform activities.  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  There was some question about possible stenosis of vertebrobasilar system so MRA of the head and neck was done and stenosis was ruled out.  PCCM was consulted for possible need of home NIV.  They have recommended that patient should have that at home.  All the paperwork is completed.  Patient is medically ready for discharge and plan was to discharge her to SNF however I was informed just right now that the facility cannot provide BiPAP machine so discharge has been canceled. -Avoid or minimize sedating medications -Low RPR titer with positive T. pallidum Ab.  Daughter does not even know what syphilis is and believes that patient has never been tested positive or treated for this.  Discussed with Dr. Earlene Plater of ID who recommends Bicillin 2,400,000 units IM weekly x3 doses. -PT/OT/SLP eval and treatment  Acute on chronic RF with hypercapnia due to COPD  exacerbation: Initial VBG showed hypercapnia. -Completed antibiotic and steroid course. -Continue inhalers and nebulizers -Aspiration precautions   Recurrent falls-reportedly knees giving out. Uses walker at baseline.  No acute finding on imaging. -Voltaren gel and scheduled Tylenol -Fall precautions -Continue PT/OT.  They recommend SNF.    Essential hypertension/pulmonary hypertension: BP fairly controlled. -Continue home verapamil, metoprolol, HCTZ.  Added scheduled hydralazine here as well as as needed hydralazine.   History of dysphagia: Reportedly had esophageal stretching in the past.  Seen by SLP, currently on dysphagia 3 diet.  Modified barium swallow pending.   Chronic T11 compression fracture: slightly progressed to about 50% height loss but no retropulsion.  She has no back pain or neurodeficit.   Controlled NIDDM-2 with mild hyperglycemia: A1c 5.9%.  Blood sugar controlled.  Continue SSI and Tradjenta.   Hypokalemia/hypophosphatemia/hypomagnesemia: Low potassium.  Will replace.  Acute hyponatremia, improved from 126 yesterday to 130 today.   AKI on CKD-3A: Slight jump in creatinine.  We will provide her some IV hydration of about 1 L fluid.  Avoid nephrotoxic agents.   Normocytic anemia: H&H stable.  Anemia panel basically normal.  Heterogenicity of thyroid gland -No follow-up needed given age   Goal of care: 87 years of age with COPD and chronic respiratory failure.   Previous hospitalist had discussed the pros and cons of CPR and intubation with patient's daughter, [name] over the phone who prefers to keep full CODE STATUS for now.   Nutrition Body mass index is 25.97 kg/m. Nutrition Problem: Increased nutrient needs Etiology: chronic illness (COPD) Signs/Symptoms: estimated needs Interventions: Ensure Enlive (each supplement provides 350kcal and 20 grams of protein),MVI    DVT prophylaxis: enoxaparin (LOVENOX) injection 30 mg Start: 04/08/21 1430   Code Status: Full Code  Family Communication: None present at bedside.  Discussed with daughter over the phone again today.  Status is: Inpatient  Remains inpatient appropriate because:Unsafe d/c plan   Dispo: The patient is from: Home              Anticipated d/c is to: SNF              Patient currently is medically stable to d/c.   Difficult  to place patient No        Estimated body mass index is 25.44 kg/m as calculated from the following:   Height as of this encounter:  (1.575 m).   Weight as of this encounter: 63.1 kg.      Nutritional status:  Nutrition Problem: Increased nutrient needs Etiology: chronic illness (COPD)   Signs/Symptoms: estimated needs   Interventions: Ensure Enlive (each supplement provides 350kcal and 20 grams of protein), MVI    Consultants:  PCCM  Procedures:  None  Antimicrobials:  Anti-infectives (From admission, onward)    Start     Dose/Rate Route Frequency Ordered Stop   04/12/21 1000  penicillin g benzathine (BICILLIN LA) 1200000 UNIT/2ML injection 2.4 Million Units        2.4 Million Units Intramuscular Weekly 04/06/21 1018 04/26/21 0959   04/12/21 0000  penicillin g benzathine (BICILLIN LA) 1200000 UNIT/2ML SUSY injection        2.4 Million Units Intramuscular Weekly 04/09/21 1229 04/20/21 2359   04/06/21 1100  penicillin g benzathine (BICILLIN LA) 1200000 UNIT/2ML injection 2.4 Million Units  Status:  Discontinued        2.4 Million Units Intramuscular Weekly 04/06/21 1012 04/06/21 1018   04/05/21 1100  penicillin g procaine-penicillin g benzathine (BICILLIN-CR) injection 600000-600000 units  2.4 Million Units Intramuscular  Once 04/05/21 0949 04/05/21 1511   04/01/21 0400  cefTRIAXone (ROCEPHIN) 1 g in sodium chloride 0.9 % 100 mL IVPB  Status:  Discontinued        1 g 200 mL/hr over 30 Minutes Intravenous Every 24 hours 03/31/21 0424 04/04/21 2132   03/31/21 0430  cefTRIAXone (ROCEPHIN) 1 g in sodium chloride 0.9 % 100 mL IVPB        1 g 200 mL/hr over 30 Minutes Intravenous  Once 03/31/21 0426 03/31/21 0511          Subjective: Seen and examined.  Fully alert and partly oriented.  No complaints.  Objective: Vitals:   04/08/21 2344 04/09/21 0431 04/09/21 0751 04/09/21 1226  BP: 111/66 (!) 125/38 (!) 124/53 (!) 128/52  Pulse: 67 61 67 61   Resp: 17 20 20 20   Temp: 98.4 F (36.9 C) 98.1 F (36.7 C) 98.3 F (36.8 C) 98.3 F (36.8 C)  TempSrc: Oral Oral Oral Oral  SpO2: 100% 99% 99% 93%  Weight:      Height:       No intake or output data in the 24 hours ending 04/09/21 1444  Filed Weights   03/30/21 1957 04/03/21 0915 04/07/21 0527  Weight: 64.4 kg 64.4 kg 63.1 kg    Examination: General exam: Appears calm and comfortable  Respiratory system: Clear to auscultation. Respiratory effort normal. Cardiovascular system: S1 & S2 heard, RRR. No JVD, murmurs, rubs, gallops or clicks. No pedal edema. Gastrointestinal system: Abdomen is nondistended, soft and nontender. No organomegaly or masses felt. Normal bowel sounds heard. Central nervous system: Alert and oriented x2. No focal neurological deficits. Extremities: Symmetric 5 x 5 power. Skin: No rashes, lesions or ulcers.  Psychiatry: Judgement and insight appear poor   Data Reviewed: I have personally reviewed following labs and imaging studies  CBC: Recent Labs  Lab 04/07/21 0407 04/08/21 0732 04/09/21 0131  WBC 11.7* 8.4 9.6  NEUTROABS  --  5.7 5.8  HGB 10.8* 10.6* 10.2*  HCT 33.6* 32.9* 31.6*  MCV 84.2 85.9 85.2  PLT 430* 417* 413*    Basic Metabolic Panel: Recent Labs  Lab 04/03/21 0647 04/07/21 0407 04/08/21 0732 04/09/21 0131  NA 135 126* 130* 130*  K 4.2 3.4* 4.4 3.8  CL 94* 85* 89* 88*  CO2 34* 34* 33* 32  GLUCOSE 116* 142* 87 127*  BUN 10 18 16 22   CREATININE 0.91 1.01* 1.13* 1.37*  CALCIUM 8.5* 9.1 9.1 9.1  MG 2.0 2.0  --   --   PHOS 2.5  --   --   --     GFR: Estimated Creatinine Clearance: 22.9 mL/min (A) (by C-G formula based on SCr of 1.37 mg/dL (H)). Liver Function Tests: Recent Labs  Lab 04/03/21 0647 04/08/21 0732 04/09/21 0131  AST  --  26 26  ALT  --  13 16  ALKPHOS  --  51 48  BILITOT  --  0.7 0.5  PROT  --  6.5 6.3*  ALBUMIN 2.6* 2.8* 2.7*    No results for input(s): LIPASE, AMYLASE in the last 168  hours. No results for input(s): AMMONIA in the last 168 hours.  Coagulation Profile: No results for input(s): INR, PROTIME in the last 168 hours. Cardiac Enzymes: Recent Labs  Lab 04/03/21 0647  CKTOTAL 105    BNP (last 3 results) No results for input(s): PROBNP in the last 8760 hours. HbA1C: No results for input(s): HGBA1C in the last  72 hours. CBG: Recent Labs  Lab 04/08/21 2039 04/08/21 2348 04/09/21 0444 04/09/21 0747 04/09/21 1222  GLUCAP 114* 115* 146* 128* 98    Lipid Profile: No results for input(s): CHOL, HDL, LDLCALC, TRIG, CHOLHDL, LDLDIRECT in the last 72 hours. Thyroid Function Tests: No results for input(s): TSH, T4TOTAL, FREET4, T3FREE, THYROIDAB in the last 72 hours. Anemia Panel: No results for input(s): VITAMINB12, FOLATE, FERRITIN, TIBC, IRON, RETICCTPCT in the last 72 hours. Sepsis Labs: No results for input(s): PROCALCITON, LATICACIDVEN in the last 168 hours.  Recent Results (from the past 240 hour(s))  Resp Panel by RT-PCR (Flu A&B, Covid) Nasopharyngeal Swab     Status: None   Collection Time: 03/30/21  8:18 PM   Specimen: Nasopharyngeal Swab; Nasopharyngeal(NP) swabs in vial transport medium  Result Value Ref Range Status   SARS Coronavirus 2 by RT PCR NEGATIVE NEGATIVE Final    Comment: (NOTE) SARS-CoV-2 target nucleic acids are NOT DETECTED.  The SARS-CoV-2 RNA is generally detectable in upper respiratory specimens during the acute phase of infection. The lowest concentration of SARS-CoV-2 viral copies this assay can detect is 138 copies/mL. A negative result does not preclude SARS-Cov-2 infection and should not be used as the sole basis for treatment or other patient management decisions. A negative result may occur with  improper specimen collection/handling, submission of specimen other than nasopharyngeal swab, presence of viral mutation(s) within the areas targeted by this assay, and inadequate number of viral copies(<138  copies/mL). A negative result must be combined with clinical observations, patient history, and epidemiological information. The expected result is Negative.  Fact Sheet for Patients:  BloggerCourse.com  Fact Sheet for Healthcare Providers:  SeriousBroker.it  This test is no t yet approved or cleared by the [place] FDA and  has been authorized for detection and/or diagnosis of SARS-CoV-2 by FDA under an Emergency Use Authorization (EUA). This EUA will remain  in effect (meaning this test can be used) for the duration of the COVID-19 declaration under Section 564(b)(1) of the Act, 21 U.S.C.section 360bbb-3(b)(1), unless the authorization is terminated  or revoked sooner.       Influenza A by PCR NEGATIVE NEGATIVE Final   Influenza B by PCR NEGATIVE NEGATIVE Final    Comment: (NOTE) The Xpert Xpress SARS-CoV-2/FLU/RSV plus assay is intended as an aid in the diagnosis of influenza from Nasopharyngeal swab specimens and should not be used as a sole basis for treatment. Nasal washings and aspirates are unacceptable for Xpert Xpress SARS-CoV-2/FLU/RSV testing.  Fact Sheet for Patients: BloggerCourse.com  Fact Sheet for Healthcare Providers: SeriousBroker.it  This test is not yet approved or cleared by the [place] FDA and has been authorized for detection and/or diagnosis of SARS-CoV-2 by FDA under an Emergency Use Authorization (EUA). This EUA will remain in effect (meaning this test can be used) for the duration of the COVID-19 declaration under Section 564(b)(1) of the Act, 21 U.S.C. section 360bbb-3(b)(1), unless the authorization is terminated or revoked.  Performed at [facility], [address], [place], [place] [address]   Surgical pcr screen     Status: None   Collection Time: 04/02/21 11:30 PM   Specimen: Nasal Mucosa; Nasal Swab  Result Value Ref  Range Status   MRSA, PCR NEGATIVE NEGATIVE Final   Staphylococcus aureus NEGATIVE NEGATIVE Final    Comment: (NOTE) The Xpert SA Assay (FDA approved for NASAL specimens in patients 108 years of age and older), is one component of a comprehensive surveillance program.  It is not intended to diagnose infection nor to guide or monitor treatment. Performed at [facility], [address], [place], [place] [address]   Resp Panel by RT-PCR (Flu A&B, Covid) Nasopharyngeal Swab     Status: None   Collection Time: 04/06/21  1:26 PM   Specimen: Nasopharyngeal Swab; Nasopharyngeal(NP) swabs in vial transport medium  Result Value Ref Range Status   SARS Coronavirus 2 by RT PCR NEGATIVE NEGATIVE Final    Comment: (NOTE) SARS-CoV-2 target nucleic acids are NOT DETECTED.  The SARS-CoV-2 RNA is generally detectable in upper respiratory specimens during the acute phase of infection. The lowest concentration of SARS-CoV-2 viral copies this assay can detect is 138 copies/mL. A negative result does not preclude SARS-Cov-2 infection and should not be used as the sole basis for treatment or other patient management decisions. A negative result may occur with  improper specimen collection/handling, submission of specimen other than nasopharyngeal swab, presence of viral mutation(s) within the areas targeted by this assay, and inadequate number of viral copies(<138 copies/mL). A negative result must be combined with clinical observations, patient history, and epidemiological information. The expected result is Negative.  Fact Sheet for Patients:  BloggerCourse.com  Fact Sheet for Healthcare Providers:  SeriousBroker.it  This test is no t yet approved or cleared by the [place] FDA and  has been authorized for detection and/or diagnosis of SARS-CoV-2 by FDA under an Emergency Use Authorization (EUA). This EUA will remain  in effect (meaning  this test can be used) for the duration of the COVID-19 declaration under Section 564(b)(1) of the Act, 21 U.S.C.section 360bbb-3(b)(1), unless the authorization is terminated  or revoked sooner.       Influenza A by PCR NEGATIVE NEGATIVE Final   Influenza B by PCR NEGATIVE NEGATIVE Final    Comment: (NOTE) The Xpert Xpress SARS-CoV-2/FLU/RSV plus assay is intended as an aid in the diagnosis of influenza from Nasopharyngeal swab specimens and should not be used as a sole basis for treatment. Nasal washings and aspirates are unacceptable for Xpert Xpress SARS-CoV-2/FLU/RSV testing.  Fact Sheet for Patients: BloggerCourse.com  Fact Sheet for Healthcare Providers: SeriousBroker.it  This test is not yet approved or cleared by the [place] FDA and has been authorized for detection and/or diagnosis of SARS-CoV-2 by FDA under an Emergency Use Authorization (EUA). This EUA will remain in effect (meaning this test can be used) for the duration of the COVID-19 declaration under Section 564(b)(1) of the Act, 21 U.S.C. section 360bbb-3(b)(1), unless the authorization is terminated or revoked.  Performed at [facility], [address] [place], [place] [address]       Radiology Studies: MR ANGIO HEAD WO CONTRAST  Result Date: 04/09/2021 CLINICAL DATA:  Possible abnormality of vertebrobasilar arteries on MRI brain EXAM: MRA HEAD WITHOUT CONTRAST MRA NECK WITHOUT CONTRAST TECHNIQUE: Angiographic images of the Circle of Willis were obtained using MRA technique without intravenous contrast. Angiographic images of the neck were obtained using MRA technique without intravenous contrast. Carotid stenosis measurements (when applicable) are obtained utilizing NASCET criteria, using the distal internal carotid diameter as the denominator. COMPARISON:  None. FINDINGS: Motion artifact is present. MRA HEAD Intracranial internal carotid arteries  are patent with atherosclerotic irregularity. Prox middle and anterior cerebral arteries are patent. There is flow related enhancement of the right greater than left vertebral arteries. Left vertebral artery may terminate as a PICA. Partial visualization the basilar artery. Bilateral posterior communicating arteries are present and appear to  be the primary supply of patent proximal posterior cerebral arteries. MRA NECK Common, internal, and external carotid arteries are patent. Poor evaluation of the proximal internal carotids due to artifact with no apparent high-grade stenosis. Extracranial dominant right vertebral artery is patent. There is partial visualization of the left vertebral artery. IMPRESSION: No definite hemodynamically significant stenosis in the neck. Partial visualization of non dominant left vertebral artery may be due to small caliber in the setting of artifact. Partial visualization of the vertebrobasilar arteries intracranially is probably a function of small caliber in the setting of artifact. There are bilateral posterior communicating arteries that appear to be the primary supply of the posterior cerebral arteries and this would account for a smaller vertebrobasilar system. Electronically Signed   By: Guadlupe Spanish M.D.   On: 04/09/2021 11:05   MR ANGIO NECK WO CONTRAST  Result Date: 04/09/2021 CLINICAL DATA:  Possible abnormality of vertebrobasilar arteries on MRI brain EXAM: MRA HEAD WITHOUT CONTRAST MRA NECK WITHOUT CONTRAST TECHNIQUE: Angiographic images of the Circle of Willis were obtained using MRA technique without intravenous contrast. Angiographic images of the neck were obtained using MRA technique without intravenous contrast. Carotid stenosis measurements (when applicable) are obtained utilizing NASCET criteria, using the distal internal carotid diameter as the denominator. COMPARISON:  None. FINDINGS: Motion artifact is present. MRA HEAD Intracranial internal carotid  arteries are patent with atherosclerotic irregularity. Prox middle and anterior cerebral arteries are patent. There is flow related enhancement of the right greater than left vertebral arteries. Left vertebral artery may terminate as a PICA. Partial visualization the basilar artery. Bilateral posterior communicating arteries are present and appear to be the primary supply of patent proximal posterior cerebral arteries. MRA NECK Common, internal, and external carotid arteries are patent. Poor evaluation of the proximal internal carotids due to artifact with no apparent high-grade stenosis. Extracranial dominant right vertebral artery is patent. There is partial visualization of the left vertebral artery. IMPRESSION: No definite hemodynamically significant stenosis in the neck. Partial visualization of non dominant left vertebral artery may be due to small caliber in the setting of artifact. Partial visualization of the vertebrobasilar arteries intracranially is probably a function of small caliber in the setting of artifact. There are bilateral posterior communicating arteries that appear to be the primary supply of the posterior cerebral arteries and this would account for a smaller vertebrobasilar system. Electronically Signed   By: Guadlupe Spanish M.D.   On: 04/09/2021 11:05   MR BRAIN WO CONTRAST  Result Date: 04/08/2021 CLINICAL DATA:  Stroke suspected. EXAM: MRI HEAD WITHOUT CONTRAST TECHNIQUE: Multiplanar, multiecho pulse sequences of the brain and surrounding structures were obtained without intravenous contrast. COMPARISON:  Head CT Mar 03, 2021. FINDINGS: Brain: No acute infarction, hemorrhage, hydrocephalus, extra-axial collection or mass lesion. Remote lacunar infarct within the left side of the pons. Scattered confluent foci of T2 hyperintensity are seen within the white matter of the cerebral hemispheres and within the pons, nonspecific, most likely related to chronic small vessel ischemia.  Developmental venous anomaly in the left corona radiata with surrounding T2 hyperintensity. Vascular: Normal flow void in the major arteries of the anterior circulation. Very diminutive vertebrobasilar system may be related to hypoplasia and/or stenosis. Skull and upper cervical spine: Normal marrow signal. Sinuses/Orbits: Right lens surgery. Opacification of the left frontal sinus. Other: Right mastoid effusion. IMPRESSION: 1. No acute intracranial abnormality. 2. Moderate chronic microvascular ischemic changes of the white matter. 3. Remote lacunar infarct within the left side of the pons. 4.  Diminutive vertebrobasilar system, may be related to hypoplasia although stenosis cannot be excluded. Correlation with MR angiogram or CT angiogram suggested. Electronically Signed   By: Baldemar Lenis M.D.   On: 04/08/2021 13:14   DG Swallowing Func-Speech Pathology  Result Date: 04/08/2021 Objective Swallowing Evaluation: Type of Study: MBS-Modified Barium Swallow Study  Patient Details Name: RADIANCE DEADY MRN: [id number] Date of Birth: 1925-11-19 Today's Date: 04/08/2021 Time: SLP Start Time (ACUTE ONLY): 1320 -SLP Stop Time (ACUTE ONLY): 1335 SLP Time Calculation (min) (ACUTE ONLY): 15 min Past Medical History: Past Medical History: Diagnosis Date  Bronchitis   COPD (chronic obstructive pulmonary disease) (HCC)   Hypertension  Past Surgical History: Past Surgical History: Procedure Laterality Date  EYE SURGERY    TUBAL LIGATION   HPI: Pt is a 87 year old woman admitted on 03/31/21 with AMS, COPD exacerbation  and falls. Pt injured her L ankle/foot and hit her head in a fall. Head CT negative, CXR on admission negative. Esophageal dysphagia reported by pt's daughter to referring MD. Esophagram 08/06/19: Esophageal dysmotility, likely presbyesophagus. Small hiatal hernia with mild stricture as evidenced by underdistention at the gastroesophageal junction. PMH: COPD on 3L at home, t11 compression fx, HTN, AAA.   Subjective: alert, upright in chair for procedure Assessment / Plan / Recommendation CH[facility] IP CLINICAL IMPRESSIONS 04/08/2021 Clinical Impression Pt presents with mild oral, mild to moderate pharyngeal, and a component of esphageal dysphagia. Pt has dentures at baseline but did not utilize this date, stating need for denture adhesive and that daughter is to bring in. Pt with reduced bolus cohesion with solids, delayed oral transit with POs, and mild oral residuals. Pt with delay in swallow initiation with all liquids at the level of the pyriform sinsus, reduced laryngeal vestibule closure with incomplete epiglottic deflection, decreased pharyngeal constriction, diminished sensation, and reduced UES distention. Cervical osteophytes noted that likely also impacted cervical esophageal patency. Pt with laryngeal penetration of thin (PAS-2,3), nectar thick (PAS-3,5) and laryngeal penetration with post swallow silent aspiration with honey thick liquids (3,5,8). Pt unable to implement cued cough despite SLP instruction due to cognitive deficits. Pt with barium tablet stasis, brief in nature in valleculae, then cricopharyngeal region (reflexive swallow did propel tablet to esophagus). During esopahgeal sweep pt with barium retention and tablet slowly traversed to the level of GE junction. Pt with hx of presbyesophagus and mild stricture per prior workup. Recommend dysphagia 3 (mechanical soft) and thin liquids with meds crushed in puree. SLP to follow up. SLP Visit Diagnosis Dysphagia, oropharyngeal phase (R13.12);Dysphagia, pharyngoesophageal phase (R13.14) Attention and concentration deficit following -- Frontal lobe and executive function deficit following -- Impact on safety and function Mild aspiration risk;Moderate aspiration risk   CHL IP TREATMENT RECOMMENDATION 04/08/2021 Treatment Recommendations Therapy as outlined in treatment plan below   Prognosis 04/08/2021 Prognosis for Safe Diet Advancement Fair Barriers to Reach  Goals Cognitive deficits;Time post onset Barriers/Prognosis Comment -- CHL IP DIET RECOMMENDATION 04/08/2021 SLP Diet Recommendations Dysphagia 3 (Mech soft) solids;Thin liquid Liquid Administration via Cup;Straw Medication Administration Crushed with puree Compensations Minimize environmental distractions;Slow rate;Small sips/bites;Clear throat intermittently Postural Changes Remain semi-upright after after feeds/meals (Comment);Seated upright at 90 degrees   CHL IP OTHER RECOMMENDATIONS 04/08/2021 Recommended Consults -- Oral Care Recommendations Oral care BID Other Recommendations --   CHL IP FOLLOW UP RECOMMENDATIONS 04/08/2021 Follow up Recommendations 24 hour supervision/assistance   CHL IP FREQUENCY AND DURATION 04/08/2021 Speech Therapy Frequency (ACUTE ONLY) min 2x/week Treatment Duration 2 weeks  CHL IP ORAL PHASE 04/08/2021 Oral Phase Impaired Oral - Pudding Teaspoon -- Oral - Pudding Cup -- Oral - Honey Teaspoon -- Oral - Honey Cup Delayed oral transit;Decreased bolus cohesion Oral - Nectar Teaspoon -- Oral - Nectar Cup Delayed oral transit Oral - Nectar Straw -- Oral - Thin Teaspoon -- Oral - Thin Cup Decreased bolus cohesion;Lingual/palatal residue Oral - Thin Straw Lingual/palatal residue;Decreased bolus cohesion Oral - Puree Decreased bolus cohesion;Delayed oral transit;Lingual/palatal residue Oral - Mech Soft Lingual/palatal residue;Delayed oral transit;Decreased bolus cohesion Oral - Regular -- Oral - Multi-Consistency -- Oral - Pill Delayed oral transit Oral Phase - Comment --  CHL IP PHARYNGEAL PHASE 04/08/2021 Pharyngeal Phase Impaired Pharyngeal- Pudding Teaspoon -- Pharyngeal -- Pharyngeal- Pudding Cup -- Pharyngeal -- Pharyngeal- Honey Teaspoon -- Pharyngeal -- Pharyngeal- Honey Cup Reduced epiglottic inversion;Reduced tongue base retraction;Penetration/Aspiration during swallow;Reduced airway/laryngeal closure;Pharyngeal residue - valleculae;Pharyngeal residue - pyriform Pharyngeal Material enters  airway, remains ABOVE vocal cords and not ejected out;Material enters airway, CONTACTS cords and not ejected out;Material enters airway, passes BELOW cords without attempt by patient to eject out (silent aspiration) Pharyngeal- Nectar Teaspoon -- Pharyngeal -- Pharyngeal- Nectar Cup Delayed swallow initiation-pyriform sinuses;Reduced epiglottic inversion;Reduced airway/laryngeal closure;Penetration/Aspiration during swallow;Pharyngeal residue - valleculae;Pharyngeal residue - pyriform Pharyngeal Material enters airway, remains ABOVE vocal cords then ejected out;Material enters airway, remains ABOVE vocal cords and not ejected out;Material enters airway, CONTACTS cords and not ejected out Pharyngeal- Nectar Straw -- Pharyngeal -- Pharyngeal- Thin Teaspoon -- Pharyngeal -- Pharyngeal- Thin Cup Delayed swallow initiation-pyriform sinuses;Penetration/Aspiration during swallow;Reduced airway/laryngeal closure Pharyngeal Material enters airway, remains ABOVE vocal cords then ejected out;Material enters airway, remains ABOVE vocal cords and not ejected out;Material does not enter airway Pharyngeal- Thin Straw Delayed swallow initiation-pyriform sinuses;Reduced airway/laryngeal closure;Penetration/Aspiration during swallow Pharyngeal Material enters airway, remains ABOVE vocal cords then ejected out Pharyngeal- Puree Delayed swallow initiation-vallecula;Reduced tongue base retraction Pharyngeal -- Pharyngeal- Mechanical Soft Delayed swallow initiation-vallecula;Reduced tongue base retraction;Pharyngeal residue - valleculae;Pharyngeal residue - pyriform;Reduced laryngeal elevation Pharyngeal -- Pharyngeal- Regular -- Pharyngeal -- Pharyngeal- Multi-consistency -- Pharyngeal -- Pharyngeal- Pill Reduced laryngeal elevation;Delayed swallow initiation-pyriform sinuses;Reduced pharyngeal peristalsis;Penetration/Aspiration during swallow;Pharyngeal residue - valleculae;Pharyngeal residue - pyriform Pharyngeal Material enters  airway, remains ABOVE vocal cords then ejected out;Material enters airway, remains ABOVE vocal cords and not ejected out Pharyngeal Comment --  CHL IP CERVICAL ESOPHAGEAL PHASE 04/08/2021 Cervical Esophageal Phase Impaired Pudding Teaspoon -- Pudding Cup -- Honey Teaspoon -- Honey Cup -- Nectar Teaspoon -- Nectar Cup -- Nectar Straw -- Thin Teaspoon -- Thin Cup -- Thin Straw -- Puree -- Mechanical Soft -- Regular -- Multi-consistency -- Pill Reduced cricopharyngeal relaxation Cervical Esophageal Comment -- Chelsea E Hartness MA, CCC-SLP 04/08/2021, 2:13 PM              EEG adult  Result Date: 04/08/2021 Charlsie Quest, MD     04/08/2021  9:59 AM Patient Name: BAYLYNN SHIFFLETT MRN: [id number] Epilepsy Attending: Charlsie Quest Referring Physician/Provider: Dr Hughie Closs Date: 04/08/2021 Duration: 23.21 mins Patient history: 87yo F with ams. EEG to evaluate for seizure Level of alertness: Awake AEDs during EEG study: None Technical aspects: This EEG study was done with scalp electrodes positioned according to the 10-20 International system of electrode placement. Electrical activity was acquired at a sampling rate of 500Hz  and reviewed with a high frequency filter of 70Hz  and a low frequency filter of 1Hz . EEG data were recorded continuously and digitally stored. Description: The posterior dominant rhythm consists of 8 Hz activity of moderate  voltage (25-35 uV) seen predominantly in posterior head regions, symmetric and reactive to eye opening and eye closing. EEG showed continuous generalized 3 to 6 Hz theta-delta slowing. Hyperventilation and photic stimulation were not performed.   ABNORMALITY - Continuous slow, generalized IMPRESSION: This study is suggestive of mild to moderate diffuse encephalopathy, nonspecific etiology. No seizures or epileptiform discharges were seen throughout the recording. Priyanka Annabelle Harman     Scheduled Meds:  acetaminophen  500 mg Oral Q8H   diclofenac Sodium  2 g Topical QID    enoxaparin (LOVENOX) injection  30 mg Subcutaneous Q24H   ferrous sulfate  325 mg Oral Q breakfast   fluticasone furoate-vilanterol  1 puff Inhalation Daily   And   umeclidinium bromide  1 puff Inhalation Daily   guaiFENesin  600 mg Oral BID   hydrALAZINE  25 mg Oral Q8H   hydrochlorothiazide  12.5 mg Oral Daily   insulin aspart  0-9 Units Subcutaneous Q4H   latanoprost  1 drop Right Eye QHS   linagliptin  5 mg Oral Daily   mouth rinse  15 mL Mouth Rinse BID   metoprolol succinate  50 mg Oral Daily   mirtazapine  7.5 mg Oral QHS   multivitamin with minerals  1 tablet Oral Daily   pantoprazole  40 mg Oral Daily   [START ON 04/12/2021] penicillin g benzathine (BICILLIN-LA) IM  2.4 Million Units Intramuscular Weekly   rosuvastatin  20 mg Oral QHS   verapamil  240 mg Oral QHS   Continuous Infusions:  lactated ringers Stopped (04/08/21 1230)     LOS: 9 days   Time spent: 30 minutes   Hughie Closs, MD [facility]  04/09/2021, 2:44 PM   How to contact the [facility] Attending or Consulting provider 7A - 7P or covering provider during after hours 7P -7A, for this patient?  Check the care team in [facility] and look for a) attending/consulting [facility] provider listed and b) the [facility] team listed. Page or secure chat 7A-7P. Log into [email or web address] and use [place]'s universal password to access. If you do not have the password, please contact the hospital operator. Locate the [facility] provider you are looking for under [facility] and page to a number that you can be directly reached. If you still have difficulty reaching the provider, please page the [facility] (Director on Call) for the Hospitalists listed on amion for assistance.

## 2021-04-09 NOTE — Progress Notes (Signed)
  Speech Language Pathology Treatment: Dysphagia  Patient Details Name: Sheri [name] MRN: [id number] DOB: Mar 12, 1926 Today's Date: 04/09/2021 Time: 5056-9794 SLP Time Calculation (min) (ACUTE ONLY): 11 min  Assessment / Plan / Recommendation Clinical Impression  SLP followed up for diet tolerance. Per RN, pt has been tolerating current diet. Family at bedside, educated regarding results of MBSS yesterday including diet recommendations and safe swallowing strategies. Attempted PO snack with pt, however pt lethargy noted and was unarousable from afternoon nap for snack. Swallowing precautions posted at bedside. Recommend dentures placed with all meals with adhesive and full supervision to reduce aspiration risk. Will continue to follow.    HPI HPI: Pt is a 87 year old woman admitted on 03/31/21 with AMS, COPD exacerbation  and falls. Pt injured her L ankle/foot and hit her head in a fall. Head CT negative, CXR on admission negative. Esophageal dysphagia reported by pt's daughter to referring MD. Esophagram 08/06/19: Esophageal dysmotility, likely presbyesophagus. Small hiatal hernia with mild stricture as evidenced by underdistention at the gastroesophageal junction. PMH: COPD on 3L at home, t11 compression fx, HTN, AAA.      SLP Plan  Continue with current plan of care       Recommendations  Diet recommendations: Dysphagia 3 (mechanical soft);Thin liquid Liquids provided via: Cup;Straw Medication Administration: Crushed with puree Supervision: Staff to assist with self feeding;Full supervision/cueing for compensatory strategies Compensations: Minimize environmental distractions;Slow rate;Small sips/bites;Clear throat intermittently Postural Changes and/or Swallow Maneuvers: Seated upright 90 degrees;Upright 30-60 min after meal                Oral Care Recommendations: Oral care BID Follow up Recommendations: 24 hour supervision/assistance;Skilled Nursing facility Plan: Continue with  current plan of care       GO                Ardyth Gal MA, CCC-SLP Acute Rehabilitation Services   04/09/2021, 3:04 PM

## 2021-04-09 NOTE — Progress Notes (Signed)
RT note. Attempted to place patient on bipap tonight, patient shaking head no/refusing. Bipap is in patients room if anything changes and patient needs it. Patient currently on 2L [place] sat 100% w/stable VS. No labored breathing noted.  RT will continue to monitor.

## 2021-04-10 LAB — BASIC METABOLIC PANEL
Anion gap: 10 (ref 5–15)
BUN: 17 mg/dL (ref 8–23)
CO2: 27 mmol/L (ref 22–32)
Calcium: 9 mg/dL (ref 8.9–10.3)
Chloride: 92 mmol/L — ABNORMAL LOW (ref 98–111)
Creatinine, Ser: 1.22 mg/dL — ABNORMAL HIGH (ref 0.44–1.00)
GFR, Estimated: 42 mL/min — ABNORMAL LOW (ref >60)
Glucose, Bld: 118 mg/dL — ABNORMAL HIGH (ref 70–99)
Potassium: 3.6 mmol/L (ref 3.5–5.1)
Sodium: 129 mmol/L — ABNORMAL LOW (ref 135–145)

## 2021-04-10 LAB — CBC WITH DIFFERENTIAL/PLATELET
Abs Immature Granulocytes: 0.06 10*3/uL (ref 0.00–0.07)
Basophils Absolute: 0 10*3/uL (ref 0.0–0.1)
Basophils Relative: 0 %
Eosinophils Absolute: 0.2 10*3/uL (ref 0.0–0.5)
Eosinophils Relative: 2 %
HCT: 32.7 % — ABNORMAL LOW (ref 36.0–46.0)
Hemoglobin: 10.3 g/dL — ABNORMAL LOW (ref 12.0–15.0)
Immature Granulocytes: 1 %
Lymphocytes Relative: 23 %
Lymphs Abs: 2.2 10*3/uL (ref 0.7–4.0)
MCH: 27.3 pg (ref 26.0–34.0)
MCHC: 31.5 g/dL (ref 30.0–36.0)
MCV: 86.7 fL (ref 80.0–100.0)
Monocytes Absolute: 1.2 10*3/uL — ABNORMAL HIGH (ref 0.1–1.0)
Monocytes Relative: 12 %
Neutro Abs: 5.9 10*3/uL (ref 1.7–7.7)
Neutrophils Relative %: 62 %
Platelets: 438 10*3/uL — ABNORMAL HIGH (ref 150–400)
RBC: 3.77 MIL/uL — ABNORMAL LOW (ref 3.87–5.11)
RDW: 15.5 % (ref 11.5–15.5)
WBC: 9.5 10*3/uL (ref 4.0–10.5)
nRBC: 0 % (ref 0.0–0.2)

## 2021-04-10 LAB — GLUCOSE, CAPILLARY
Glucose-Capillary: 104 mg/dL — ABNORMAL HIGH (ref 70–99)
Glucose-Capillary: 109 mg/dL — ABNORMAL HIGH (ref 70–99)
Glucose-Capillary: 113 mg/dL — ABNORMAL HIGH (ref 70–99)
Glucose-Capillary: 113 mg/dL — ABNORMAL HIGH (ref 70–99)
Glucose-Capillary: 118 mg/dL — ABNORMAL HIGH (ref 70–99)
Glucose-Capillary: 140 mg/dL — ABNORMAL HIGH (ref 70–99)

## 2021-04-10 NOTE — Plan of Care (Signed)

## 2021-04-10 NOTE — Progress Notes (Signed)
Physical Therapy Treatment Patient Details Name: Sheri [name] MRN: [id number] DOB: September 24, 1926 Today's Date: 04/10/2021    History of Present Illness Pt is a 87 year old woman admitted on 03/31/21 with AMS, COPD exacerbation  and falls. Pt injured her L ankle/foot and hit her head in a fall. Head CT negative, MRI pending. PMH: COPD on 3L at home, T11 compression fx, HTN, AAA.    PT Comments    Pt received in supine, daughter present in room, pt with good participation and fair tolerance for gait, transfer and exercise instruction. HEP handout given to pt/daughter to continue performing in chair/bed. Pt quick to fatigue and having difficulty carrying on conversation about her response to activity due to cognition but participatory as able. Pt VSS on 2L O2 [place] throughout and chair alarm on at end of session. Pt continues to benefit from PT services to progress toward functional mobility goals. Continue to recommend SNF.   Follow Up Recommendations  SNF;Supervision/Assistance - 24 hour     Equipment Recommendations  Other (comment);None recommended by PT (defer to post-acute)    Recommendations for Other Services       Precautions / Restrictions Precautions Precautions: Fall Precaution Comments: frequent falls at home (2-3x week); modified diet Restrictions Weight Bearing Restrictions: No    Mobility  Bed Mobility Overal bed mobility: Needs Assistance Bed Mobility: Supine to Sit     Supine to sit: +2 for physical assistance;Min assist     General bed mobility comments: Increased time to perform, minA for trunk guidance and anterior scooting    Transfers Overall transfer level: Needs assistance Equipment used: Rolling walker (2 wheeled) Transfers: Sit to/from [employer] Sit to Stand: Min assist;Mod assist Stand pivot transfers: Mod assist;+2 safety/equipment       General transfer comment: Min to ModA for power up at bedside and ModA overall for taking  steps to recliner. Also able to perform chair push-ups x5 after rest break with increased time/cues.  Ambulation/Gait Ambulation/Gait assistance: Mod assist;+2 safety/equipment Gait Distance (Feet): 4 Feet Assistive device: Rolling walker (2 wheeled) Gait Pattern/deviations: Step-to pattern;Decreased step length - right;Decreased step length - left;Trunk flexed Gait velocity: slowed   General Gait Details: Distance limited due to pt fatigue and drowsiness, pt impulsive to sit prior to reaching chair and unable to safely progress further 2/2 cognition/fatigue.   Stairs                   Balance Overall balance assessment: Needs assistance Sitting-balance support: No upper extremity supported;Feet supported Sitting balance-Leahy Scale: Fair     Standing balance support: Bilateral upper extremity supported Standing balance-Leahy Scale: Poor Standing balance comment: reliant on UE support in standing, pt at times picking up RW (confusion) +1-2 A for safety but only needs +1 physical support                            Cognition Arousal/Alertness: Awake/alert Behavior During Therapy: Flat affect Overall Cognitive Status: Impaired/Different from baseline Area of Impairment: Memory;Following commands;Safety/judgement;Awareness;Problem solving;Orientation;Attention                 Orientation Level: Disoriented to;Place;Time;Situation Current Attention Level: Focused Memory: Decreased short-term memory Following Commands: Follows one step commands with increased time Safety/Judgement: Decreased awareness of safety;Decreased awareness of deficits Awareness: Intellectual Problem Solving: Slow processing;Decreased initiation;Difficulty sequencing;Requires verbal cues;Requires tactile cues General Comments: Pt needs greatly increased time to initiate bed mobility and not trusting  of staff this date but with daughter present to encourage her, eventually agreeable to  participate. Pt frequently mumbling under her breath and closing eyes once up in chair but BP/VSS.      Exercises Other Exercises Other Exercises: seated BLE AROM: ankle pumps, LAQ, hip flexion x10 reps ea Other Exercises: chair push-ups (partial squat) x5 reps    General Comments General comments (skin integrity, edema, etc.): HR 67-71 bpm, SpO2 98% on 2L O2 [place] and BP 126/54 (75) once seated in chair, reports dizziness prior to sitting but unable to assess standing BP due to pt fatigue and need for physical assist in standing      Pertinent Vitals/Pain Pain Assessment: Faces Pain Score: 0-No pain     PT Goals (current goals can now be found in the care plan section) Acute Rehab PT Goals Patient Stated Goal: pt unable to state, daughter wants her to do therapy and get stronger so she can get back home soon. PT Goal Formulation: Patient unable to participate in goal setting Time For Goal Achievement: 04/15/21 Potential to Achieve Goals: Fair Progress towards PT goals: Progressing toward goals    Frequency    Min 2X/week      PT Plan Current plan remains appropriate    Co-evaluation              AM-PAC PT "6 Clicks" Mobility   Outcome Measure  Help needed turning from your back to your side while in a flat bed without using bedrails?: A Little Help needed moving from lying on your back to sitting on the side of a flat bed without using bedrails?: A Little Help needed moving to and from a bed to a chair (including a wheelchair)?: A Lot Help needed standing up from a chair using your arms (e.g., wheelchair or bedside chair)?: A Lot Help needed to walk in hospital room?: A Lot Help needed climbing 3-5 steps with a railing? : Total 6 Click Score: 13    End of Session Equipment Utilized During Treatment: Oxygen;Gait belt Activity Tolerance: Patient tolerated treatment well;Patient limited by fatigue Patient left: with call bell/phone within reach;Other (comment);in  chair;with chair alarm set;with family/visitor present (pillows under LUE to maintain neutral posture in chair) Nurse Communication: Mobility status PT Visit Diagnosis: Unsteadiness on feet (R26.81);Other abnormalities of gait and mobility (R26.89);Repeated falls (R29.6);Muscle weakness (generalized) (M62.81);Difficulty in walking, not elsewhere classified (R26.2);History of falling (Z91.81);Pain Pain - Right/Left: Left Pain - part of body: Ankle and joints of foot     Time: 2458-0998 PT Time Calculation (min) (ACUTE ONLY): 39 min  Charges:  $Gait Training: 8-22 mins $Therapeutic Exercise: 8-22 mins $Therapeutic Activity: 8-22 mins                     Shayanne Gomm P., PTA Acute Rehabilitation Services Pager: [phone] Office: [phone]    Dorathy Kinsman Fusaye Wachtel 04/10/2021, 2:00 PM

## 2021-04-10 NOTE — Progress Notes (Signed)
PROGRESS NOTE    Sheri [name]  ZOX:[id number] DOB: [birth year] DOA: 03/30/2021 PCP: Renaye Rakers, MD   Brief Narrative:  87 year old F with PMH of COPD and chronic RF on 3 L, HTN, DM-2, T11 compression fracture and osteoarthritis presenting with altered mental status for 2 days.  Patient has had a fall 4 days prior to admission.  Per patient's daughter, patient had recurrent falls over the last 4 months with leg giving out.  No history of dementia.    In ED, VBG with acute on chronic respiratory acidosis with hypercapnia. CT head and cervical spine, DG left foot, DG left ankle, b/l knees, thoracic/lumbar spine without acute finding. Patient was admitted for acute metabolic encephalopathy likely from hypercapnia in the setting of COPD exacerbation.  Started on BiPAP and COPD pathway. MRI brain ordered but patient could not tolerate.  Anesthesia did not feel MRI under anesthesia is the safest thing to do at this time given his COPD exacerbation.  Patient's mental status improved but seems to have delirium now.     Therapy recommended SNF.  TOC working on this.  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  CT head without acute finding but cerebral atrophy with chronic microvascular changes.  Basic encephalopathy work-up unrevealing except for mildly elevated ammonia and positive RPR.  Overall, encephalopathy improved after treatment of hypercapnia but seemed to have waxing and waning mental status. On the night of 04/06/2021, rapid response was called since patient was yelling that she could not breathe.  She was seen by nurse practitioner. She was bumped to 3 L.  Early morning 04/07/2021, she was completely lethargic with slurred speech raising suspicion for possible stroke.  MRI and EEG were ordered but not done that day due to her condition.  ABGs were done which once again showed hypercarbia.  She was started on BiPAP and she improved in few hours with improvement in ABG.  EEG did not show epileptiform activities.  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  There was some question about possible stenosis of vertebrobasilar system so MRA of the head and neck was done and stenosis was ruled out.  PCCM was consulted for possible need of home NIV.  They have recommended that patient should have that at home.  All the paperwork is completed.  Patient is medically ready for discharge since the morning of 04/09/2021 however her accepting SNF facility is not able to provide BiPAP as of now and thus discharge is pending. -Avoid or minimize sedating medications -Low RPR titer with positive T. pallidum Ab.  Daughter does not even know what syphilis is and believes that patient has never been tested positive or treated for this.  Discussed with Dr. Earlene Plater of ID who recommends Bicillin 2,400,000 units IM weekly x3 doses. -PT/OT/SLP eval and treatment  Acute on chronic RF with hypercapnia due to COPD exacerbation: Initial VBG showed hypercapnia. -Completed antibiotic and steroid course. -Continue inhalers and nebulizers -Aspiration precautions   Recurrent falls-reportedly knees giving out. Uses walker at baseline.  No acute finding on imaging. -Voltaren gel and scheduled Tylenol -  Fall precautions -Continue PT/OT.  They recommend SNF.   Essential hypertension/pulmonary hypertension: BP fairly controlled. -Continue home verapamil, metoprolol, HCTZ.  Added scheduled hydralazine here as well as as needed hydralazine.   History of dysphagia:  Reportedly had esophageal stretching in the past.  Seen by SLP, currently on dysphagia 3 diet.  Modified barium swallow pending.   Chronic T11 compression fracture: slightly progressed to about 50% height loss but no retropulsion.  She has no back pain or neurodeficit.   Controlled NIDDM-2 with mild hyperglycemia: A1c 5.9%.  Blood sugar controlled.  Continue SSI and Tradjenta.   Hypokalemia/hypophosphatemia/hypomagnesemia: Low potassium.  Will replace.  Acute hyponatremia, improved from 126 yesterday to 130 today.   AKI on CKD-3A: Some improvement in creatinine.  Received 1 L of fluid yesterday.  We will give her 1 more liter of IV fluid today.  Repeat labs in the morning.   Normocytic anemia: H&H stable.  Anemia panel basically normal.  Heterogenicity of thyroid gland -No follow-up needed given age   Goal of care: 87 years of age with COPD and chronic respiratory failure.   Previous hospitalist had discussed the pros and cons of CPR and intubation with patient's daughter, [place] over the phone who prefers to keep full CODE STATUS for now.   Nutrition Body mass index is 25.97 kg/m. Nutrition Problem: Increased nutrient needs Etiology: chronic illness (COPD) Signs/Symptoms: estimated needs Interventions: Ensure Enlive (each supplement provides 350kcal and 20 grams of protein),MVI    DVT prophylaxis: enoxaparin (LOVENOX) injection 30 mg Start: 04/08/21 1430   Code Status: Full Code  Family Communication: None present at bedside.    Status is: Inpatient  Remains inpatient appropriate because:Unsafe d/c plan   Dispo: The patient is from: Home              Anticipated d/c is to: SNF              Patient currently is medically stable to d/c.   Difficult to place patient No        Estimated body mass index is 25.44 kg/m as calculated from the following:   Height as of this encounter: 5\' 2"  (1.575 m).   Weight as of this encounter: 63.1 kg.      Nutritional  status:  Nutrition Problem: Increased nutrient needs Etiology: chronic illness (COPD)   Signs/Symptoms: estimated needs   Interventions: Ensure Enlive (each supplement provides 350kcal and 20 grams of protein), MVI    Consultants:  PCCM  Procedures:  None  Antimicrobials:  Anti-infectives (From admission, onward)    Start     Dose/Rate Route Frequency Ordered Stop   04/12/21 1000  penicillin g benzathine (BICILLIN LA) 1200000 UNIT/2ML injection 2.4 Million Units        2.4 Million Units Intramuscular Weekly 04/06/21 1018 04/26/21 0959   04/12/21 0000  penicillin g benzathine (BICILLIN LA) 1200000 UNIT/2ML SUSY injection        2.4 Million Units Intramuscular Weekly 04/09/21 1229 04/20/21 2359   04/06/21 1100  penicillin g benzathine (BICILLIN LA) 1200000 UNIT/2ML injection 2.4 Million Units  Status:  Discontinued        2.4 Million Units Intramuscular Weekly 04/06/21 1012 04/06/21 1018   04/05/21 1100  penicillin g procaine-penicillin g benzathine (BICILLIN-CR) injection 600000-600000 units        2.4 Million Units Intramuscular  Once 04/05/21 0949 04/05/21 1511   04/01/21 0400  cefTRIAXone (ROCEPHIN) 1 g in sodium chloride 0.9 % 100 mL IVPB  Status:  Discontinued        1 g 200 mL/hr over 30 Minutes Intravenous Every 24 hours 03/31/21 0424 04/04/21 2132   03/31/21 0430  cefTRIAXone (ROCEPHIN) 1 g in sodium chloride 0.9 % 100 mL IVPB        1 g 200 mL/hr over 30 Minutes Intravenous  Once 03/31/21 0426 03/31/21 0511          Subjective: Seen and examined, fully alert and mostly oriented however she was crying that she had not slept well last night.  Objective: Vitals:   04/09/21 2334 04/10/21 0453 04/10/21 0802 04/10/21 1134  BP: 113/73 122/62 140/61 (!) 126/54  Pulse: 63 60 66 72  Resp: 20 20 18 20   Temp: 97.6 F (36.4 C) 98.1 F (36.7 C) 98.4 F (36.9 C) 98.7 F (37.1 C)  TempSrc: Oral Oral Oral Oral  SpO2: 97% 96% 100% 100%  Weight:      Height:         Intake/Output Summary (Last 24 hours) at 04/10/2021 1443 Last data filed at 04/10/2021 0805 Gross per 24 hour  Intake 938.01 ml  Output 300 ml  Net 638.01 ml    Filed Weights   03/30/21 1957 04/03/21 0915 04/07/21 0527  Weight: 64.4 kg 64.4 kg 63.1 kg    Examination: General exam: Appears calm and comfortable  Respiratory system: Clear to auscultation. Respiratory effort normal. Cardiovascular system: S1 & S2 heard, RRR. No JVD, murmurs, rubs, gallops or clicks. No pedal edema. Gastrointestinal system: Abdomen is nondistended, soft and nontender. No organomegaly or masses felt. Normal bowel sounds heard. Central nervous system: Alert and oriented x2. No focal neurological deficits. Extremities: Symmetric 5 x 5 power. Skin: No rashes, lesions or ulcers.  Psychiatry: Judgement and insight appear poor   Data Reviewed: I have personally reviewed following labs and imaging studies  CBC: Recent Labs  Lab 04/07/21 0407 04/08/21 0732 04/09/21 0131 04/10/21 0342  WBC 11.7* 8.4 9.6 9.5  NEUTROABS  --  5.7 5.8 5.9  HGB 10.8* 10.6* 10.2* 10.3*  HCT 33.6* 32.9* 31.6* 32.7*  MCV 84.2 85.9 85.2 86.7  PLT 430* 417* 413* 438*    Basic Metabolic Panel: Recent Labs  Lab 04/07/21 0407 04/08/21 0732 04/09/21 0131 04/10/21 0342  NA 126* 130* 130* 129*  K 3.4* 4.4 3.8 3.6  CL 85* 89* 88* 92*  CO2 34* 33* 32 27  GLUCOSE 142* 87 127* 118*  BUN 18 16 22 17   CREATININE 1.01* 1.13* 1.37* 1.22*  CALCIUM 9.1 9.1 9.1 9.0  MG 2.0  --   --   --     GFR: Estimated Creatinine Clearance: 25.7 mL/min (A) (by C-G formula based on SCr of 1.22 mg/dL (H)). Liver Function Tests: Recent Labs  Lab 04/08/21 0732 04/09/21 0131  AST 26 26  ALT 13 16  ALKPHOS 51 48  BILITOT 0.7 0.5  PROT 6.5 6.3*  ALBUMIN 2.8* 2.7*    No results for input(s): LIPASE, AMYLASE in the last 168 hours. No results for input(s): AMMONIA in the last 168 hours.  Coagulation Profile: No results for input(s):  INR, PROTIME in the last 168 hours. Cardiac Enzymes: No results for input(s): CKTOTAL, CKMB, CKMBINDEX, TROPONINI in the last 168 hours.  BNP (last 3 results) No results for input(s): PROBNP in the last 8760 hours. HbA1C: No results for input(s): HGBA1C in the last 72 hours. CBG: Recent Labs  Lab 04/09/21 2053 04/09/21 2339 04/10/21 0458 04/10/21 0759 04/10/21 1157  GLUCAP 158* 80  140* 113* 109*    Lipid Profile: No results for input(s): CHOL, HDL, LDLCALC, TRIG, CHOLHDL, LDLDIRECT in the last 72 hours. Thyroid Function Tests: No results for input(s): TSH, T4TOTAL, FREET4, T3FREE, THYROIDAB in the last 72 hours. Anemia Panel: No results for input(s): VITAMINB12, FOLATE, FERRITIN, TIBC, IRON, RETICCTPCT in the last 72 hours. Sepsis Labs: No results for input(s): PROCALCITON, LATICACIDVEN in the last 168 hours.  Recent Results (from the past 240 hour(s))  Surgical pcr screen     Status: None   Collection Time: 04/02/21 11:30 PM   Specimen: Nasal Mucosa; Nasal Swab  Result Value Ref Range Status   MRSA, PCR NEGATIVE NEGATIVE Final   Staphylococcus aureus NEGATIVE NEGATIVE Final    Comment: (NOTE) The Xpert SA Assay (FDA approved for NASAL specimens in patients 44 years of age and older), is one component of a comprehensive surveillance program. It is not intended to diagnose infection nor to guide or monitor treatment. Performed at [facility], [address], [place], [place] [address]   Resp Panel by RT-PCR (Flu A&B, Covid) Nasopharyngeal Swab     Status: None   Collection Time: 04/06/21  1:26 PM   Specimen: Nasopharyngeal Swab; Nasopharyngeal(NP) swabs in vial transport medium  Result Value Ref Range Status   SARS Coronavirus 2 by RT PCR NEGATIVE NEGATIVE Final    Comment: (NOTE) SARS-CoV-2 target nucleic acids are NOT DETECTED.  The SARS-CoV-2 RNA is generally detectable in upper respiratory specimens during the acute phase of infection. The  lowest concentration of SARS-CoV-2 viral copies this assay can detect is 138 copies/mL. A negative result does not preclude SARS-Cov-2 infection and should not be used as the sole basis for treatment or other patient management decisions. A negative result may occur with  improper specimen collection/handling, submission of specimen other than nasopharyngeal swab, presence of viral mutation(s) within the areas targeted by this assay, and inadequate number of viral copies(<138 copies/mL). A negative result must be combined with clinical observations, patient history, and epidemiological information. The expected result is Negative.  Fact Sheet for Patients:  BloggerCourse.com  Fact Sheet for Healthcare Providers:  SeriousBroker.it  This test is no t yet approved or cleared by the [place] FDA and  has been authorized for detection and/or diagnosis of SARS-CoV-2 by FDA under an Emergency Use Authorization (EUA). This EUA will remain  in effect (meaning this test can be used) for the duration of the COVID-19 declaration under Section 564(b)(1) of the Act, 21 U.S.C.section 360bbb-3(b)(1), unless the authorization is terminated  or revoked sooner.       Influenza A by PCR NEGATIVE NEGATIVE Final   Influenza B by PCR NEGATIVE NEGATIVE Final    Comment: (NOTE) The Xpert Xpress SARS-CoV-2/FLU/RSV plus assay is intended as an aid in the diagnosis of influenza from Nasopharyngeal swab specimens and should not be used as a sole basis for treatment. Nasal washings and aspirates are unacceptable for Xpert Xpress SARS-CoV-2/FLU/RSV testing.  Fact Sheet for Patients: BloggerCourse.com  Fact Sheet for Healthcare Providers: SeriousBroker.it  This test is not yet approved or cleared by the [place] FDA and has been authorized for detection and/or diagnosis of SARS-CoV-2 by FDA under  an Emergency Use Authorization (EUA). This EUA will remain in effect (meaning this test can be used) for the duration of the COVID-19 declaration under Section 564(b)(1) of the Act, 21 U.S.C. section 360bbb-3(b)(1), unless the authorization is terminated or revoked.  Performed at [facility], [address] [place], [place] [address]       Radiology Studies: MR ANGIO HEAD WO CONTRAST  Result Date: 04/09/2021 CLINICAL DATA:  Possible abnormality of vertebrobasilar arteries on MRI brain EXAM: MRA HEAD WITHOUT CONTRAST MRA NECK WITHOUT CONTRAST TECHNIQUE: Angiographic images of the Circle of Willis were obtained using MRA technique without intravenous contrast. Angiographic images of the neck were obtained using MRA technique without intravenous contrast. Carotid stenosis measurements (when applicable) are obtained utilizing NASCET criteria, using the distal internal carotid diameter as the denominator. COMPARISON:  None. FINDINGS: Motion artifact is present. MRA HEAD Intracranial internal carotid arteries are patent with atherosclerotic irregularity. Prox middle and anterior cerebral arteries are patent. There is flow related enhancement of the right greater than left vertebral arteries. Left vertebral artery may terminate as a PICA. Partial visualization the basilar artery. Bilateral posterior communicating arteries are present and appear to be the primary supply of patent proximal posterior cerebral arteries. MRA NECK Common, internal, and external carotid arteries are patent. Poor evaluation of the proximal internal carotids due to artifact with no apparent high-grade stenosis. Extracranial dominant right vertebral artery is patent. There is partial visualization of the left vertebral artery. IMPRESSION: No definite hemodynamically significant stenosis in the neck. Partial visualization of non dominant left vertebral artery may be due to small caliber in the setting of artifact. Partial  visualization of the vertebrobasilar arteries intracranially is probably a function of small caliber in the setting of artifact. There are bilateral posterior communicating arteries that appear to be the primary supply of the posterior cerebral arteries and this would account for a smaller vertebrobasilar system. Electronically Signed   By: Guadlupe Spanish M.D.   On: 04/09/2021 11:05   MR ANGIO NECK WO CONTRAST  Result Date: 04/09/2021 CLINICAL DATA:  Possible abnormality of vertebrobasilar arteries on MRI brain EXAM: MRA HEAD WITHOUT CONTRAST MRA NECK WITHOUT CONTRAST TECHNIQUE: Angiographic images of the Circle of Willis were obtained using MRA technique without intravenous contrast. Angiographic images of the neck were obtained using MRA technique without intravenous contrast. Carotid stenosis measurements (when applicable) are obtained utilizing NASCET criteria, using the distal internal carotid diameter as the denominator. COMPARISON:  None. FINDINGS: Motion artifact is present. MRA HEAD Intracranial internal carotid arteries are patent with atherosclerotic irregularity. Prox middle and anterior cerebral arteries are patent. There is flow related enhancement of the right greater than left vertebral arteries. Left vertebral artery may terminate as a PICA. Partial visualization the basilar artery. Bilateral posterior communicating arteries are present and appear to be the primary supply of patent proximal posterior cerebral arteries. MRA NECK Common, internal, and external carotid arteries are patent. Poor evaluation of the proximal internal carotids due to artifact with no apparent high-grade stenosis. Extracranial dominant right vertebral artery is patent. There is partial visualization of the left vertebral artery. IMPRESSION: No definite hemodynamically significant stenosis in the neck. Partial visualization of non dominant left vertebral artery may be due to small caliber in the setting of artifact.  Partial visualization of the vertebrobasilar arteries intracranially is probably a function of small caliber in the setting of artifact. There are bilateral posterior communicating arteries that appear to be the primary supply of the posterior cerebral arteries and this would account for a smaller vertebrobasilar system. Electronically Signed   By: Guadlupe Spanish M.D.   On: 04/09/2021 11:05     Scheduled Meds:  acetaminophen  500 mg Oral Q8H   diclofenac Sodium  2 g Topical QID   enoxaparin (LOVENOX) injection  30 mg Subcutaneous Q24H   ferrous sulfate  325 mg Oral Q breakfast   fluticasone furoate-vilanterol  1 puff Inhalation Daily   And   umeclidinium bromide  1 puff Inhalation Daily   guaiFENesin  600 mg Oral BID   hydrALAZINE  25 mg Oral Q8H   hydrochlorothiazide  12.5 mg Oral Daily   insulin aspart  0-9 Units Subcutaneous Q4H   latanoprost  1 drop Right Eye QHS   linagliptin  5 mg Oral Daily   mouth rinse  15 mL Mouth Rinse BID   metoprolol succinate  50 mg Oral Daily   mirtazapine  7.5 mg Oral QHS   multivitamin with minerals  1 tablet Oral Daily   pantoprazole  40 mg Oral Daily   [START ON 04/12/2021] penicillin g benzathine (BICILLIN-LA) IM  2.4 Million Units Intramuscular Weekly   rosuvastatin  20 mg Oral QHS   verapamil  240 mg Oral QHS   Continuous Infusions:  lactated ringers 75 mL/hr at 04/10/21 0240     LOS: 10 days   Time spent: 28 minutes   Hughie Closs, MD [facility]  04/10/2021, 2:43 PM   How to contact the [facility] Attending or Consulting provider 7A - 7P or covering provider during after hours 7P -7A, for this patient?  Check the care team in [facility] and look for a) attending/consulting TRH provider listed and b) the [facility] team listed. Page or secure chat 7A-7P. Log into [email or web address] and use [place]'s universal password to access. If you do not have the password, please contact the hospital operator. Locate the [facility] provider you are looking for under  [facility] and page to a number that you can be directly reached. If you still have difficulty reaching the provider, please page the [facility] (Director on Call) for the Hospitalists listed on amion for assistance.

## 2021-04-11 LAB — CBC WITH DIFFERENTIAL/PLATELET
Abs Immature Granulocytes: 0.06 10*3/uL (ref 0.00–0.07)
Basophils Absolute: 0 10*3/uL (ref 0.0–0.1)
Basophils Relative: 0 %
Eosinophils Absolute: 0.2 10*3/uL (ref 0.0–0.5)
Eosinophils Relative: 2 %
HCT: 32 % — ABNORMAL LOW (ref 36.0–46.0)
Hemoglobin: 10.3 g/dL — ABNORMAL LOW (ref 12.0–15.0)
Immature Granulocytes: 1 %
Lymphocytes Relative: 23 %
Lymphs Abs: 2.5 10*3/uL (ref 0.7–4.0)
MCH: 27.6 pg (ref 26.0–34.0)
MCHC: 32.2 g/dL (ref 30.0–36.0)
MCV: 85.8 fL (ref 80.0–100.0)
Monocytes Absolute: 1.3 10*3/uL — ABNORMAL HIGH (ref 0.1–1.0)
Monocytes Relative: 12 %
Neutro Abs: 6.7 10*3/uL (ref 1.7–7.7)
Neutrophils Relative %: 62 %
Platelets: 453 10*3/uL — ABNORMAL HIGH (ref 150–400)
RBC: 3.73 MIL/uL — ABNORMAL LOW (ref 3.87–5.11)
RDW: 15.7 % — ABNORMAL HIGH (ref 11.5–15.5)
WBC: 10.7 10*3/uL — ABNORMAL HIGH (ref 4.0–10.5)
nRBC: 0 % (ref 0.0–0.2)

## 2021-04-11 LAB — GLUCOSE, CAPILLARY
Glucose-Capillary: 110 mg/dL — ABNORMAL HIGH (ref 70–99)
Glucose-Capillary: 115 mg/dL — ABNORMAL HIGH (ref 70–99)
Glucose-Capillary: 122 mg/dL — ABNORMAL HIGH (ref 70–99)
Glucose-Capillary: 125 mg/dL — ABNORMAL HIGH (ref 70–99)
Glucose-Capillary: 91 mg/dL (ref 70–99)
Glucose-Capillary: 92 mg/dL (ref 70–99)

## 2021-04-11 NOTE — TOC Progression Note (Signed)
Transition of Care [facility]) - Progression Note    Patient Details  Name: Sheri [name] MRN: [id number] Date of Birth: 1926-07-30  Transition of [facility] CM/SW Contact  Patrice [place], [place] Phone Number: [phone] 04/11/2021, 12:00 PM  Clinical Narrative:     CSW spoke with [name] again and updated her about [facility] and CSW was informed that bipap could not go to facility.  CSW reviewed the current bed offers and [name] declined 5 of the bed offers stating that she did not just want to place pt any where. [name] informed that she is coming up to the hospital and wants to speak with MD about possibly bringing pt home. CSW asked if [name] could follow up with CSW once decision is made.  CSW did not receive permission to follow up with any other facilities.  TOC team will continue to assist with discharge planning needs.    Expected Discharge Plan: Skilled Nursing Facility Barriers to Discharge: English as a second language teacher, SNF Pending bed offer  Expected Discharge Plan and Services Expected Discharge Plan: Skilled Nursing Facility     Post Acute Care Choice: Skilled Nursing Facility Living arrangements for the past 2 months: Single Family Home Expected Discharge Date: 04/09/21                                     Social Determinants of Health (SDOH) Interventions    Readmission Risk Interventions No flowsheet data found.

## 2021-04-11 NOTE — Progress Notes (Signed)
RT attempted to place pt on BIPAP. Patient is refusing BIPAP at this time. Patient is currently on 2L Tower with no respiratory distress noted. RT will monitor as needed.

## 2021-04-11 NOTE — Progress Notes (Signed)
Patient refuse NIV for the night, patient machine is at bedside for acute resp. Distress if needed. RN aware of patient refusal.

## 2021-04-11 NOTE — Plan of Care (Signed)

## 2021-04-11 NOTE — Progress Notes (Signed)
RT note- Patient does not take DPI very well nor really understands how to take. I will communicate with MD.

## 2021-04-11 NOTE — TOC Progression Note (Signed)
Transition of Care [facility]) - Progression Note    Patient Details  Name: Sheri [name] MRN: [id number] Date of Birth: [birth year]  [facility] CM/SW Contact  Patrice [place], LCSW Phone Number: 04/11/2021, 4:50 PM  Clinical Narrative:     CSW received a follow up phone call from pt's daughter Sheri [name] and she informed CSW that MD has encouraged that pt discharge to SNF. Sheri [name] informed CSW that she is riding by a facility and will let CW know which facility it is so CSW can make sure that the referral has been sent to that facility.  TOC team will continue to assist with discharge planning needs.   Expected Discharge Plan: Skilled Nursing Facility Barriers to Discharge: English as a second language teacher, SNF Pending bed offer  Expected Discharge Plan and Services Expected Discharge Plan: Skilled Nursing Facility     Post Acute Care Choice: Skilled Nursing Facility Living arrangements for the past 2 months: Single Family Home Expected Discharge Date: 04/09/21                                     Social Determinants of Health (SDOH) Interventions    Readmission Risk Interventions No flowsheet data found.

## 2021-04-11 NOTE — TOC Progression Note (Addendum)
Transition of Care [facility]) - Progression Note    Patient Details  Name: Sheri [name] MRN: [id number] Date of Birth: [birth year]  Transition of Care [facility]) CM/SW Contact  Patrice Paradise, [place] Phone Number: [phone] 04/11/2021, 11:35 AM  Clinical Narrative:     CSW spoke with pt daughter to discuss the issue with the bipap. [name] let CSW that MD had ordered a bipap for home and if she could take to the facility. CSW also asked about other possible facilities and daughter prefers [facility] because she was able to complete a visit.  CSW spoke with Fritzi Mandes at [facility] and she confirmed that facility is unable to secure a bipap however family can bring one and they can accept on Monday.  CSW spoke with RN CM Wendi and she informed CSW that she would start the process of trying to secure a bipap for pt.    11;42: RN CM Wendi informed CSW that [employer] clarified that the bipap was being ordered for when pt comes home and not or the facility. CSW will reach back out to daughter to explore other facilities.   TOC team will continue to assist with discharge planning needs.   Expected Discharge Plan: Skilled Nursing Facility Barriers to Discharge: English as a second language teacher, SNF Pending bed offer  Expected Discharge Plan and Services Expected Discharge Plan: Skilled Nursing Facility     Post Acute Care Choice: Skilled Nursing Facility Living arrangements for the past 2 months: Single Family Home Expected Discharge Date: 04/09/21                                     Social Determinants of Health (SDOH) Interventions    Readmission Risk Interventions No flowsheet data found.

## 2021-04-11 NOTE — Progress Notes (Signed)
PROGRESS NOTE    Sheri [name]  KVQ:[id number] DOB: October 28, 1926 DOA: 03/30/2021 PCP: Sheri Rakers, MD   Brief Narrative:  87 year old F with PMH of COPD and chronic RF on 3 L, HTN, DM-2, T11 compression fracture and osteoarthritis presenting with altered mental status for 2 days.  Patient has had a fall 4 days prior to admission.  Per patient's daughter, patient had recurrent falls over the last 4 months with leg giving out.  No history of dementia.    In ED, VBG with acute on chronic respiratory acidosis with hypercapnia. CT head and cervical spine, DG left foot, DG left ankle, b/l knees, thoracic/lumbar spine without acute finding. Patient was admitted for acute metabolic encephalopathy likely from hypercapnia in the setting of COPD exacerbation.  Started on BiPAP and COPD pathway. MRI brain ordered but patient could not tolerate.  Anesthesia did not feel MRI under anesthesia is the safest thing to do at this time given his COPD exacerbation.  Patient's mental status improved but seems to have delirium now.     Therapy recommended SNF.  TOC working on this.  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  CT head without acute finding but cerebral atrophy with chronic microvascular changes.  Basic encephalopathy work-up unrevealing except for mildly elevated ammonia and positive RPR.  Overall, encephalopathy improved after treatment of hypercapnia but seemed to have waxing and waning mental status. On the night of 04/06/2021, rapid response was called since patient was yelling that she could not breathe.  She was seen by nurse practitioner. She was bumped to 3 L.  Early morning 04/07/2021, she was completely lethargic with slurred speech raising suspicion for possible stroke.  MRI and EEG were ordered but not done that day due to her condition.  ABGs were done which once again showed hypercarbia.  She was started on BiPAP and she improved in few hours with improvement in ABG.  EEG did not show epileptiform activities.  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  There was some question about possible stenosis of vertebrobasilar system so MRA of the head and neck was done and stenosis was ruled out.  PCCM was consulted for possible need of home NIV.  They have recommended that patient should have that at home.  All the paperwork is completed.  Patient is medically ready for discharge since the morning of 04/09/2021 however her accepting SNF facility is not able to provide BiPAP as of now and thus discharge is pending.  Patient cannot be discharged home either since [facility] has not been able to deliver NIV at home either. -Avoid or minimize sedating medications -Low RPR titer with positive T. pallidum Ab.  Daughter does not even know what syphilis is and believes that patient has never been tested positive or treated for this.  Discussed with Dr. Earlene [name] of ID who recommends Bicillin 2,400,000 units IM weekly x3 doses. -PT/OT/SLP eval and treatment  Acute on chronic RF with hypercapnia due to COPD exacerbation: Initial VBG showed hypercapnia. -Completed antibiotic and steroid course. -Continue inhalers and nebulizers -Aspiration precautions   Recurrent  falls-reportedly knees giving out. Uses walker at baseline.  No acute finding on imaging. -Voltaren gel and scheduled Tylenol -Fall precautions -Continue PT/OT.  They recommend SNF.   Essential hypertension/pulmonary hypertension: BP fairly controlled. -Continue home verapamil, metoprolol,  HCTZ.  Added scheduled hydralazine here as well as as needed hydralazine.   History of dysphagia: Reportedly had esophageal stretching in the past.  Seen by SLP, currently on dysphagia 3 diet.  Modified barium swallow pending.   Chronic T11 compression fracture: slightly progressed to about 50% height loss but no retropulsion.  She has no back pain or neurodeficit.   Controlled NIDDM-2 with mild hyperglycemia: A1c 5.9%.  Blood sugar controlled.  Continue SSI and Tradjenta.   Hypokalemia/hypophosphatemia/hypomagnesemia: Low potassium.  Will replace.  Acute hyponatremia, improved from 126 yesterday to 130 today.   AKI on CKD-3A: Some improvement in creatinine.  Received 1 L of fluid yesterday.  We will give her 1 more liter of IV fluid today.  Repeat labs in the morning.   Normocytic anemia: H&H stable.  Anemia panel basically normal.  Heterogenicity of thyroid gland -No follow-up needed given age   Goal of care: 87 years of age with COPD and chronic respiratory failure.   Previous hospitalist had discussed the pros and cons of CPR and intubation with patient's daughter, Sheri [name] over the phone who prefers to keep full CODE STATUS for now.   Nutrition Body mass index is 25.97 kg/m. Nutrition Problem: Increased nutrient needs Etiology: chronic illness (COPD) Signs/Symptoms: estimated needs Interventions: Ensure Enlive (each supplement provides 350kcal and 20 grams of protein),MVI   DVT prophylaxis: enoxaparin (LOVENOX) injection 30 mg Start: 04/08/21 1430   Code Status: Full Code  Family Communication: None present at bedside.  Discussed with her daughter over the phone.  She now is considering taking with her home once machine is delivered if she is not discharged to SNF in the meantime.  Status is: Inpatient  Remains inpatient appropriate because:Unsafe d/c plan   Dispo: The patient is from: Home              Anticipated d/c is to: SNF              Patient currently is  medically stable to d/c.   Difficult to place patient No        Estimated body mass index is 25.44 kg/m as calculated from the following:   Height as of this encounter:  (1.575 m).   Weight as of this encounter: 63.1 kg.      Nutritional status:  Nutrition Problem: Increased nutrient needs Etiology: chronic illness (COPD)   Signs/Symptoms: estimated needs   Interventions: Ensure Enlive (each supplement provides 350kcal and 20 grams of protein), MVI    Consultants:  PCCM  Procedures:  None  Antimicrobials:  Anti-infectives (From admission, onward)    Start     Dose/Rate Route Frequency Ordered Stop   04/12/21 1000  penicillin g benzathine (BICILLIN LA) 1200000 UNIT/2ML injection 2.4 Million Units        2.4 Million Units Intramuscular Weekly 04/06/21 1018 04/26/21 0959   04/12/21 0000  penicillin g benzathine (BICILLIN LA) 1200000 UNIT/2ML SUSY injection        2.4 Million Units Intramuscular Weekly 04/09/21 1229 04/20/21 2359   04/06/21 1100  penicillin g benzathine (BICILLIN LA) 1200000 UNIT/2ML injection 2.4 Million Units  Status:  Discontinued        2.4 Million Units Intramuscular Weekly 04/06/21 1012 04/06/21 1018   04/05/21 1100  penicillin g procaine-penicillin g benzathine (BICILLIN-CR) injection 600000-600000 units        2.4 Million Units Intramuscular  Once 04/05/21 0949 04/05/21 1511   04/01/21 0400  cefTRIAXone (ROCEPHIN) 1 g in sodium chloride 0.9 % 100 mL IVPB  Status:  Discontinued        1 g 200 mL/hr over 30 Minutes Intravenous Every 24 hours 03/31/21 0424 04/04/21 2132   03/31/21 0430  cefTRIAXone (ROCEPHIN) 1 g in sodium chloride 0.9 % 100 mL IVPB        1 g 200 mL/hr over 30 Minutes Intravenous  Once 03/31/21 0426 03/31/21 0511          Subjective: Seen and examined.  Alert and oriented x1.  No complaints.  Objective: Vitals:   04/11/21 0447 04/11/21 0847 04/11/21 0900 04/11/21 1302  BP: 108/78 122/73  131/60  Pulse: 60 65   73  Resp: 20 20  (!) 22  Temp: (!) 97.5 F (36.4 C) 97.6 F (36.4 C)  (!) 97.5 F (36.4 C)  TempSrc: Oral Oral  Oral  SpO2: 98% 98% 96% 93%  Weight:      Height:        Intake/Output Summary (Last 24 hours) at 04/11/2021 1311 Last data filed at 04/11/2021 0655 Gross per 24 hour  Intake --  Output 200 ml  Net -200 ml    Filed Weights   03/30/21 1957 04/03/21 0915 04/07/21 0527  Weight: 64.4 kg 64.4 kg 63.1 kg    Examination: General exam: Appears calm and comfortable  Respiratory system: Clear to auscultation. Respiratory effort normal. Cardiovascular system: S1 & S2 heard, RRR. No JVD, murmurs, rubs, gallops or clicks. No pedal edema. Gastrointestinal system: Abdomen is nondistended, soft and nontender. No organomegaly or masses felt. Normal bowel sounds heard. Central nervous system: Alert and oriented  X1. no focal neurological deficits. Extremities: Symmetric 5 x 5 power. Skin: No rashes, lesions or ulcers.  Psychiatry: Judgement and insight appear poor   Data Reviewed: I have personally reviewed following labs and imaging studies  CBC: Recent Labs  Lab 04/07/21 0407 04/08/21 0732 04/09/21 0131 04/10/21 0342 04/11/21 0155  WBC 11.7* 8.4 9.6 9.5 10.7*  NEUTROABS  --  5.7 5.8 5.9 6.7  HGB 10.8* 10.6* 10.2* 10.3* 10.3*  HCT 33.6* 32.9* 31.6* 32.7* 32.0*  MCV 84.2 85.9 85.2 86.7 85.8  PLT 430* 417* 413* 438* 453*    Basic Metabolic Panel: Recent Labs  Lab 04/07/21 0407 04/08/21 0732 04/09/21 0131 04/10/21 0342  NA 126* 130* 130* 129*  K 3.4* 4.4 3.8 3.6  CL 85* 89* 88* 92*  CO2 34* 33* 32 27  GLUCOSE 142* 87 127* 118*  BUN 18 16 22 17   CREATININE 1.01* 1.13* 1.37* 1.22*  CALCIUM 9.1 9.1 9.1 9.0  MG 2.0  --   --   --     GFR: Estimated Creatinine Clearance: 25.7 mL/min (A) (by C-G formula based on SCr of 1.22 mg/dL (H)). Liver Function Tests: Recent Labs  Lab 04/08/21 0732 04/09/21 0131  AST 26 26  ALT 13 16  ALKPHOS 51 48  BILITOT 0.7 0.5   PROT 6.5 6.3*  ALBUMIN 2.8* 2.7*    No results for input(s): LIPASE, AMYLASE in the last 168 hours. No results for input(s): AMMONIA in the last 168 hours.  Coagulation Profile: No results for input(s): INR, PROTIME in the last 168 hours. Cardiac Enzymes: No results for input(s): CKTOTAL, CKMB, CKMBINDEX, TROPONINI in the last 168 hours.  BNP (last 3 results) No results for input(s): PROBNP in the last 8760 hours. HbA1C: No results for input(s): HGBA1C in the last 72 hours. CBG: Recent Labs  Lab 04/10/21 2043 04/10/21 2350 04/11/21 0450 04/11/21 0846 04/11/21 1248  GLUCAP 104* 118* 125* 115* 122*    Lipid Profile: No results for input(s): CHOL, HDL, LDLCALC, TRIG, CHOLHDL, LDLDIRECT in the last 72 hours. Thyroid Function Tests: No results for input(s): TSH, T4TOTAL, FREET4, T3FREE, THYROIDAB in the last 72 hours. Anemia Panel: No results for input(s): VITAMINB12, FOLATE, FERRITIN, TIBC, IRON, RETICCTPCT in the last 72 hours. Sepsis Labs: No results for input(s): PROCALCITON, LATICACIDVEN in the last 168 hours.  Recent Results (from the past 240 hour(s))  Surgical pcr screen     Status: None   Collection Time: 04/02/21 11:30 PM   Specimen: Nasal Mucosa; Nasal Swab  Result Value Ref Range Status   MRSA, PCR NEGATIVE NEGATIVE Final   Staphylococcus aureus NEGATIVE NEGATIVE Final    Comment: (NOTE) The Xpert SA Assay (FDA approved for NASAL specimens in patients 73 years of age and older), is one component of a comprehensive surveillance program. It is not intended to diagnose infection nor to guide or monitor treatment. Performed at [facility], [address], [place], [place] [address]   Resp Panel by RT-PCR (Flu A&B, Covid) Nasopharyngeal Swab     Status: None   Collection Time: 04/06/21  1:26 PM   Specimen: Nasopharyngeal Swab; Nasopharyngeal(NP) swabs in vial transport medium  Result Value Ref Range Status   SARS Coronavirus 2 by RT PCR NEGATIVE  NEGATIVE Final    Comment: (NOTE) SARS-CoV-2 target nucleic acids are NOT DETECTED.  The SARS-CoV-2 RNA is generally detectable in upper respiratory specimens during the acute phase of infection. The lowest concentration of SARS-CoV-2 viral copies this assay can detect is 138 copies/mL. A negative result does not preclude SARS-Cov-2 infection and should not be used as the sole basis for treatment or other patient management decisions. A negative result may occur with  improper specimen collection/handling, submission of specimen other than nasopharyngeal swab, presence of viral mutation(s) within the areas targeted by this assay, and inadequate number of viral copies(<138 copies/mL). A negative result must be combined with clinical observations, patient history, and epidemiological information. The expected result is Negative.  Fact Sheet for Patients:  BloggerCourse.com  Fact Sheet for Healthcare Providers:  SeriousBroker.it  This test is no t yet approved or cleared by the [place] FDA and  has been authorized for detection and/or diagnosis of SARS-CoV-2 by FDA under an Emergency Use Authorization (EUA). This EUA will remain  in effect (meaning this test can be used) for the duration of the COVID-19 declaration under Section 564(b)(1) of the Act, 21 U.S.C.section 360bbb-3(b)(1), unless the authorization is terminated  or revoked sooner.       Influenza A by PCR NEGATIVE NEGATIVE Final   Influenza B by PCR NEGATIVE NEGATIVE Final    Comment: (NOTE) The Xpert Xpress SARS-CoV-2/FLU/RSV plus assay is intended as an aid in the diagnosis of influenza from Nasopharyngeal swab specimens and should not be used as a sole basis for treatment. Nasal washings and aspirates are unacceptable for Xpert Xpress SARS-CoV-2/FLU/RSV testing.  Fact Sheet for Patients: BloggerCourse.com  Fact Sheet for Healthcare  Providers: SeriousBroker.it  This test is not yet approved or cleared by the [place] FDA and has been authorized for detection and/or diagnosis of SARS-CoV-2 by FDA under an Emergency Use Authorization (EUA). This  EUA will remain in effect (meaning this test can be used) for the duration of the COVID-19 declaration under Section 564(b)(1) of the Act, 21 U.S.C. section 360bbb-3(b)(1), unless the authorization is terminated or revoked.  Performed at [facility], [address] [place], [place] [address]       Radiology Studies: No results found.   Scheduled Meds:  acetaminophen  500 mg Oral Q8H   diclofenac Sodium  2 g Topical QID   enoxaparin (LOVENOX) injection  30 mg Subcutaneous Q24H   ferrous sulfate  325 mg Oral Q breakfast   fluticasone furoate-vilanterol  1 puff Inhalation Daily   And   umeclidinium bromide  1 puff Inhalation Daily   guaiFENesin  600 mg Oral BID   hydrALAZINE  25 mg Oral Q8H   hydrochlorothiazide  12.5 mg Oral Daily   insulin aspart  0-9 Units Subcutaneous Q4H   latanoprost  1 drop Right Eye QHS   linagliptin  5 mg Oral Daily   mouth rinse  15 mL Mouth Rinse BID   metoprolol succinate  50 mg Oral Daily   mirtazapine  7.5 mg Oral QHS   multivitamin with minerals  1 tablet Oral Daily   pantoprazole  40 mg Oral Daily   [START ON 04/12/2021] penicillin g benzathine (BICILLIN-LA) IM  2.4 Million Units Intramuscular Weekly   rosuvastatin  20 mg Oral QHS   verapamil  240 mg Oral QHS   Continuous Infusions:  lactated ringers 75 mL/hr at 04/11/21 0510     LOS: 11 days   Time spent: 27 minutes   Hughie Closs, MD [facility]  04/11/2021, 1:11 PM   How to contact the [facility] Attending or Consulting provider 7A - 7P or covering provider during after hours 7P -7A, for this patient?  Check the care team in [facility] and look for a) attending/consulting TRH provider listed and b) the [facility] team listed. Page or secure  chat 7A-7P. Log into [email or web address] and use [place]'s universal password to access. If you do not have the password, please contact the hospital operator. Locate the [facility] provider you are looking for under [facility] and page to a number that you can be directly reached. If you still have difficulty reaching the provider, please page the [facility] (Director on Call) for the Hospitalists listed on amion for assistance.

## 2021-04-12 ENCOUNTER — Inpatient Hospital Stay (HOSPITAL_COMMUNITY): Payer: Medicare Other

## 2021-04-12 LAB — CBC WITH DIFFERENTIAL/PLATELET
Abs Immature Granulocytes: 0.08 10*3/uL — ABNORMAL HIGH (ref 0.00–0.07)
Basophils Absolute: 0 10*3/uL (ref 0.0–0.1)
Basophils Relative: 0 %
Eosinophils Absolute: 0.2 10*3/uL (ref 0.0–0.5)
Eosinophils Relative: 3 %
HCT: 29.1 % — ABNORMAL LOW (ref 36.0–46.0)
Hemoglobin: 9.2 g/dL — ABNORMAL LOW (ref 12.0–15.0)
Immature Granulocytes: 1 %
Lymphocytes Relative: 20 %
Lymphs Abs: 1.7 10*3/uL (ref 0.7–4.0)
MCH: 26.9 pg (ref 26.0–34.0)
MCHC: 31.6 g/dL (ref 30.0–36.0)
MCV: 85.1 fL (ref 80.0–100.0)
Monocytes Absolute: 1.1 10*3/uL — ABNORMAL HIGH (ref 0.1–1.0)
Monocytes Relative: 12 %
Neutro Abs: 5.7 10*3/uL (ref 1.7–7.7)
Neutrophils Relative %: 64 %
Platelets: 399 10*3/uL (ref 150–400)
RBC: 3.42 MIL/uL — ABNORMAL LOW (ref 3.87–5.11)
RDW: 15.7 % — ABNORMAL HIGH (ref 11.5–15.5)
WBC: 8.8 10*3/uL (ref 4.0–10.5)
nRBC: 0 % (ref 0.0–0.2)

## 2021-04-12 LAB — BASIC METABOLIC PANEL
Anion gap: 8 (ref 5–15)
BUN: 14 mg/dL (ref 8–23)
CO2: 30 mmol/L (ref 22–32)
Calcium: 8.6 mg/dL — ABNORMAL LOW (ref 8.9–10.3)
Chloride: 94 mmol/L — ABNORMAL LOW (ref 98–111)
Creatinine, Ser: 1.02 mg/dL — ABNORMAL HIGH (ref 0.44–1.00)
GFR, Estimated: 52 mL/min — ABNORMAL LOW (ref >60)
Glucose, Bld: 82 mg/dL (ref 70–99)
Potassium: 3.2 mmol/L — ABNORMAL LOW (ref 3.5–5.1)
Sodium: 132 mmol/L — ABNORMAL LOW (ref 135–145)

## 2021-04-12 LAB — GLUCOSE, CAPILLARY
Glucose-Capillary: 81 mg/dL (ref 70–99)
Glucose-Capillary: 110 mg/dL — ABNORMAL HIGH (ref 70–99)
Glucose-Capillary: 108 mg/dL — ABNORMAL HIGH (ref 70–99)
Glucose-Capillary: 96 mg/dL (ref 70–99)
Glucose-Capillary: 95 mg/dL (ref 70–99)
Glucose-Capillary: 115 mg/dL — ABNORMAL HIGH (ref 70–99)
Glucose-Capillary: 117 mg/dL — ABNORMAL HIGH (ref 70–99)

## 2021-04-12 NOTE — TOC Progression Note (Addendum)
Transition of Care [facility]) - Progression Note    Patient Details  Name: Sheri [name] MRN: [id number] Date of Birth: 1926-08-22  [facility] CM/SW Contact  Patrice Paradise, LCSW Phone Number: [phone] 04/12/2021, 7:59 AM  Clinical Narrative:     CSW received a text message from pt's daughter stating that she rode by facilities and it is [facility]. CSW will follow up wit [facility] to see if they can review pt's referral and if they have a bipap machine.  9:50am- CSW reached out to April at [facility] however had to leave message with front desk.  TOC team will continue to assist with discharge planning needs. .   Expected Discharge Plan: Skilled Nursing Facility Barriers to Discharge: Insurance Authorization, SNF Pending bed offer  Expected Discharge Plan and Services Expected Discharge Plan: Skilled Nursing Facility     Post Acute Care Choice: Skilled Nursing Facility Living arrangements for the past 2 months: Single Family Home Expected Discharge Date: 04/09/21                                     Social Determinants of Health (SDOH) Interventions    Readmission Risk Interventions No flowsheet data found.

## 2021-04-12 NOTE — Progress Notes (Signed)
PROGRESS NOTE    Sheri [name]  IHK:[id number] DOB: [birth year] DOA: 03/30/2021 PCP: Renaye RakersBland, Veita, MD   Brief Narrative:  87 year old F with PMH of COPD and chronic RF on 3 L, HTN, DM-2, T11 compression fracture and osteoarthritis presenting with altered mental status for 2 days.  Patient has had a fall 4 days prior to admission.  Per patient's daughter, patient had recurrent falls over the last 4 months with leg giving out.  No history of dementia.    In ED, VBG with acute on chronic respiratory acidosis with hypercapnia. CT head and cervical spine, DG left foot, DG left ankle, b/l knees, thoracic/lumbar spine without acute finding. Patient was admitted for acute metabolic encephalopathy likely from hypercapnia in the setting of COPD exacerbation.  Started on BiPAP and COPD pathway. MRI brain ordered but patient could not tolerate.  Anesthesia did not feel MRI under anesthesia is the safest thing to do at this time given his COPD exacerbation.  Patient's mental status improved but seems to have delirium now.     Therapy recommended SNF.  TOC working on this.  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  CT head without acute finding but cerebral atrophy with chronic microvascular changes.  Basic encephalopathy work-up unrevealing except for mildly elevated ammonia and positive RPR.  Overall, encephalopathy improved after treatment of hypercapnia but seemed to have waxing and waning mental status. On the night of 04/06/2021, rapid response was called since patient was yelling that she could not breathe.  She was seen by nurse practitioner. She was bumped to 3 L.  Early morning 04/07/2021, she was completely lethargic with slurred speech raising suspicion for possible stroke.  MRI and EEG were ordered but not done that day due to her condition.  ABGs were done which once again showed hypercarbia.  She was started on BiPAP and she improved in few hours with improvement in ABG.  EEG did not show epileptiform activities.  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  There was some question about possible stenosis of vertebrobasilar system so MRA of the head and neck was done and stenosis was ruled out.  PCCM was consulted for possible need of home NIV.  They have recommended that patient should have that at home.  All the paperwork is completed.  Patient is medically ready for discharge since the morning of 04/09/2021 however her accepting SNF facility is not able to provide BiPAP as of now and thus discharge is pending.  Patient cannot be discharged home either since [facility] has not been able to deliver NIV at home either. -Avoid or minimize sedating medications -Low RPR titer with positive T. pallidum Ab.  Daughter does not even know what syphilis is and believes that patient has never been tested positive or treated for this.  Discussed with Dr. Earlene PlaterWallace of ID who recommends Bicillin 2,400,000 units IM weekly x3 doses. -PT/OT/SLP eval and treatment  Acute on chronic respiratory failure respiratory failure with hypercapnia due to COPD exacerbation: Initial VBG showed hypercapnia. -Completed antibiotic and steroid course. -Continue inhalers and nebulizers -Aspiration precautions  Recurrent falls-reportedly knees giving out. Uses walker at baseline.  No acute finding on imaging. -Voltaren gel and scheduled Tylenol -Fall precautions -Continue PT/OT.  They recommend SNF.   Essential hypertension/pulmonary hypertension: BP fairly  controlled. -Continue home verapamil, metoprolol, HCTZ.  Added scheduled hydralazine here as well as as needed hydralazine.   History of dysphagia: Reportedly had esophageal stretching in the past.  Seen by SLP, currently on dysphagia 3 diet.  Modified barium swallow done.   Chronic T11 compression fracture: slightly progressed to about 50% height loss but no retropulsion.  She has no back pain or neurodeficit.   Controlled NIDDM-2 with mild hyperglycemia: A1c 5.9%.  Blood sugar controlled.  Continue SSI and Tradjenta.   Hypokalemia/hypophosphatemia/hypomagnesemia: Low potassium.  Will replace.  Acute hyponatremia, improved, remains a stable around 130.  Rechecking BMP.   AKI on CKD-3A: Some improvement in creatinine.  Received 1 L of fluid 2 days ago and then day before yesterday.  Repeating BMP today.   Normocytic anemia: H&H stable.  Anemia panel basically normal.  Heterogenicity of thyroid gland -No follow-up needed given age   Goal of care: 87 years of age with COPD and chronic respiratory failure.   Previous hospitalist had discussed the pros and cons of CPR and intubation with patient's daughter, [name] over the phone who prefers to keep full CODE STATUS for now.   Nutrition Body mass index is 25.97 kg/m. Nutrition Problem: Increased nutrient needs Etiology: chronic illness (COPD) Signs/Symptoms: estimated needs Interventions: Ensure Enlive (each supplement provides 350kcal and 20 grams of protein),MVI   DVT prophylaxis: enoxaparin (LOVENOX) injection 30 mg Start: 04/08/21 1430   Code Status: Full Code  Family Communication: None present at bedside.  Discussed with her daughter over the phone.  She now is considering taking with her home once machine is delivered if she is not discharged to SNF in the meantime.  Status is: Inpatient  Remains inpatient appropriate because:Unsafe d/c plan   Dispo: The patient is from: Home              Anticipated d/c is to: SNF               Patient currently is medically stable to d/c.   Difficult to place patient No        Estimated body mass index is 24.35 kg/m as calculated from the following:   Height as of this encounter: 5\' 2"  (1.575 m).   Weight as of this encounter: 60.4 kg.      Nutritional status:  Nutrition Problem: Increased nutrient needs Etiology: chronic illness (COPD)   Signs/Symptoms: estimated needs   Interventions: Ensure Enlive (each supplement provides 350kcal and 20 grams of protein), MVI    Consultants:  PCCM  Procedures:  None  Antimicrobials:  Anti-infectives (From admission, onward)    Start     Dose/Rate Route Frequency Ordered Stop   04/12/21 1000  penicillin g benzathine (BICILLIN LA) 1200000 UNIT/2ML injection 2.4 Million Units        2.4 Million Units Intramuscular Weekly 04/06/21 1018 04/26/21 0959   04/12/21 0000  penicillin g benzathine (BICILLIN LA) 1200000 UNIT/2ML SUSY injection        2.4 Million Units Intramuscular Weekly 04/09/21 1229 04/20/21 2359   04/06/21 1100  penicillin g benzathine (BICILLIN LA) 1200000 UNIT/2ML injection 2.4 Million Units  Status:  Discontinued        2.4 Million Units Intramuscular Weekly 04/06/21 1012 04/06/21 1018   04/05/21 1100  penicillin g procaine-penicillin  g benzathine (BICILLIN-CR) injection 600000-600000 units        2.4 Million Units Intramuscular  Once 04/05/21 0949 04/05/21 1511   04/01/21 0400  cefTRIAXone (ROCEPHIN) 1 g in sodium chloride 0.9 % 100 mL IVPB  Status:  Discontinued        1 g 200 mL/hr over 30 Minutes Intravenous Every 24 hours 03/31/21 0424 04/04/21 2132   03/31/21 0430  cefTRIAXone (ROCEPHIN) 1 g in sodium chloride 0.9 % 100 mL IVPB        1 g 200 mL/hr over 30 Minutes Intravenous  Once 03/31/21 0426 03/31/21 0511          Subjective: Seen and examined.  Sleepy but easily arousable.  Oriented to self and place.  No complaints.  Looks comfortable.  Objective: Vitals:   04/11/21 2340  04/12/21 0436 04/12/21 0729 04/12/21 0810  BP: (!) 142/76 (!) 122/41 (!) 119/51   Pulse: 64 68 60 (!) 59  Resp: 20 14 16  (!) 21  Temp: 98.2 F (36.8 C) 98.7 F (37.1 C) 98.6 F (37 C)   TempSrc: Oral Oral Oral   SpO2: 98% 100% 97% 93%  Weight:  60.4 kg    Height:        Intake/Output Summary (Last 24 hours) at 04/12/2021 1036 Last data filed at 04/12/2021 0606 Gross per 24 hour  Intake 1020 ml  Output 400 ml  Net 620 ml    Filed Weights   04/03/21 0915 04/07/21 0527 04/12/21 0436  Weight: 64.4 kg 63.1 kg 60.4 kg    Examination: General exam: Appears calm and comfortable  Respiratory system: Clear to auscultation. Respiratory effort normal. Cardiovascular system: S1 & S2 heard, RRR. No JVD, murmurs, rubs, gallops or clicks. No pedal edema. Gastrointestinal system: Abdomen is nondistended, soft and nontender. No organomegaly or masses felt. Normal bowel sounds heard. Central nervous system: Alert and oriented x2. No focal neurological deficits. Extremities: Symmetric 5 x 5 power. Skin: No rashes, lesions or ulcers.  Psychiatry: Judgement and insight appear poor  Data Reviewed: I have personally reviewed following labs and imaging studies  CBC: Recent Labs  Lab 04/08/21 0732 04/09/21 0131 04/10/21 0342 04/11/21 0155 04/12/21 0046  WBC 8.4 9.6 9.5 10.7* 8.8  NEUTROABS 5.7 5.8 5.9 6.7 5.7  HGB 10.6* 10.2* 10.3* 10.3* 9.2*  HCT 32.9* 31.6* 32.7* 32.0* 29.1*  MCV 85.9 85.2 86.7 85.8 85.1  PLT 417* 413* 438* 453* 399    Basic Metabolic Panel: Recent Labs  Lab 04/07/21 0407 04/08/21 0732 04/09/21 0131 04/10/21 0342  NA 126* 130* 130* 129*  K 3.4* 4.4 3.8 3.6  CL 85* 89* 88* 92*  CO2 34* 33* 32 27  GLUCOSE 142* 87 127* 118*  BUN 18 16 22 17   CREATININE 1.01* 1.13* 1.37* 1.22*  CALCIUM 9.1 9.1 9.1 9.0  MG 2.0  --   --   --     GFR: Estimated Creatinine Clearance: 25.2 mL/min (A) (by C-G formula based on SCr of 1.22 mg/dL (H)). Liver Function  Tests: Recent Labs  Lab 04/08/21 0732 04/09/21 0131  AST 26 26  ALT 13 16  ALKPHOS 51 48  BILITOT 0.7 0.5  PROT 6.5 6.3*  ALBUMIN 2.8* 2.7*    No results for input(s): LIPASE, AMYLASE in the last 168 hours. No results for input(s): AMMONIA in the last 168 hours.  Coagulation Profile: No results for input(s): INR, PROTIME in the last 168 hours. Cardiac Enzymes: No results for input(s): CKTOTAL, CKMB, CKMBINDEX, TROPONINI  in the last 168 hours.  BNP (last 3 results) No results for input(s): PROBNP in the last 8760 hours. HbA1C: No results for input(s): HGBA1C in the last 72 hours. CBG: Recent Labs  Lab 04/11/21 1621 04/11/21 2019 04/11/21 2340 04/12/21 0438 04/12/21 0739  GLUCAP 110* 92 91 81 110*    Lipid Profile: No results for input(s): CHOL, HDL, LDLCALC, TRIG, CHOLHDL, LDLDIRECT in the last 72 hours. Thyroid Function Tests: No results for input(s): TSH, T4TOTAL, FREET4, T3FREE, THYROIDAB in the last 72 hours. Anemia Panel: No results for input(s): VITAMINB12, FOLATE, FERRITIN, TIBC, IRON, RETICCTPCT in the last 72 hours. Sepsis Labs: No results for input(s): PROCALCITON, LATICACIDVEN in the last 168 hours.  Recent Results (from the past 240 hour(s))  Surgical pcr screen     Status: None   Collection Time: 04/02/21 11:30 PM   Specimen: Nasal Mucosa; Nasal Swab  Result Value Ref Range Status   MRSA, PCR NEGATIVE NEGATIVE Final   Staphylococcus aureus NEGATIVE NEGATIVE Final    Comment: (NOTE) The Xpert SA Assay (FDA approved for NASAL specimens in patients 25 years of age and older), is one component of a comprehensive surveillance program. It is not intended to diagnose infection nor to guide or monitor treatment. Performed at [facility], [address], [place], [place] [address]   Resp Panel by RT-PCR (Flu A&B, Covid) Nasopharyngeal Swab     Status: None   Collection Time: 04/06/21  1:26 PM   Specimen: Nasopharyngeal Swab; Nasopharyngeal(NP)  swabs in vial transport medium  Result Value Ref Range Status   SARS Coronavirus 2 by RT PCR NEGATIVE NEGATIVE Final    Comment: (NOTE) SARS-CoV-2 target nucleic acids are NOT DETECTED.  The SARS-CoV-2 RNA is generally detectable in upper respiratory specimens during the acute phase of infection. The lowest concentration of SARS-CoV-2 viral copies this assay can detect is 138 copies/mL. A negative result does not preclude SARS-Cov-2 infection and should not be used as the sole basis for treatment or other patient management decisions. A negative result may occur with  improper specimen collection/handling, submission of specimen other than nasopharyngeal swab, presence of viral mutation(s) within the areas targeted by this assay, and inadequate number of viral copies(<138 copies/mL). A negative result must be combined with clinical observations, patient history, and epidemiological information. The expected result is Negative.  Fact Sheet for Patients:  BloggerCourse.com  Fact Sheet for Healthcare Providers:  SeriousBroker.it  This test is no t yet approved or cleared by the [place] FDA and  has been authorized for detection and/or diagnosis of SARS-CoV-2 by FDA under an Emergency Use Authorization (EUA). This EUA will remain  in effect (meaning this test can be used) for the duration of the COVID-19 declaration under Section 564(b)(1) of the Act, 21 U.S.C.section 360bbb-3(b)(1), unless the authorization is terminated  or revoked sooner.       Influenza A by PCR NEGATIVE NEGATIVE Final   Influenza B by PCR NEGATIVE NEGATIVE Final    Comment: (NOTE) The Xpert Xpress SARS-CoV-2/FLU/RSV plus assay is intended as an aid in the diagnosis of influenza from Nasopharyngeal swab specimens and should not be used as a sole basis for treatment. Nasal washings and aspirates are unacceptable for Xpert Xpress  SARS-CoV-2/FLU/RSV testing.  Fact Sheet for Patients: BloggerCourse.com  Fact Sheet for Healthcare Providers: SeriousBroker.it  This test is not yet approved or cleared by the [place] FDA and has been authorized for detection and/or diagnosis of SARS-CoV-2 by FDA under  an Emergency Use Authorization (EUA). This EUA will remain in effect (meaning this test can be used) for the duration of the COVID-19 declaration under Section 564(b)(1) of the Act, 21 U.S.C. section 360bbb-3(b)(1), unless the authorization is terminated or revoked.  Performed at [facility], [address] [place], [place] [address]       Radiology Studies: No results found.   Scheduled Meds:  acetaminophen  500 mg Oral Q8H   diclofenac Sodium  2 g Topical QID   enoxaparin (LOVENOX) injection  30 mg Subcutaneous Q24H   ferrous sulfate  325 mg Oral Q breakfast   fluticasone furoate-vilanterol  1 puff Inhalation Daily   And   umeclidinium bromide  1 puff Inhalation Daily   guaiFENesin  600 mg Oral BID   hydrALAZINE  25 mg Oral Q8H   hydrochlorothiazide  12.5 mg Oral Daily   insulin aspart  0-9 Units Subcutaneous Q4H   latanoprost  1 drop Right Eye QHS   linagliptin  5 mg Oral Daily   mouth rinse  15 mL Mouth Rinse BID   metoprolol succinate  50 mg Oral Daily   mirtazapine  7.5 mg Oral QHS   multivitamin with minerals  1 tablet Oral Daily   pantoprazole  40 mg Oral Daily   penicillin g benzathine (BICILLIN-LA) IM  2.4 Million Units Intramuscular Weekly   rosuvastatin  20 mg Oral QHS   verapamil  240 mg Oral QHS   Continuous Infusions:  lactated ringers 75 mL/hr at 04/11/21 1950     LOS: 12 days   Time spent: 25 minutes   Hughie Closs, MD [facility]  04/12/2021, 10:36 AM   How to contact the [facility] Attending or Consulting provider 7A - 7P or covering provider during after hours 7P -7A, for this patient?  Check the care team  in [facility] and look for a) attending/consulting TRH provider listed and b) the [facility] team listed. Page or secure chat 7A-7P. Log into [email or web address] and use [place]'s universal password to access. If you do not have the password, please contact the hospital operator. Locate the [facility] provider you are looking for under [facility] and page to a number that you can be directly reached. If you still have difficulty reaching the provider, please page the [facility] (Director on Call) for the Hospitalists listed on amion for assistance.

## 2021-04-12 NOTE — Progress Notes (Signed)
Spoke with CSW regarding continued barrier for SNF and getting Bipap at facility. Spoke with Ian Malkin at [facility] regarding pt's NIV status. At this time pt has not been approved yet for home NIV- [facility] continues to follow for insurance approval and will let us know as soon as [employer] comes back with a determination. Per Ian Malkin the only SNF facility that they contract with for Bipap needs is in [place] which if daughter wants to consider this, then [facility] could assist with getting Bipap for that SNF (unfortunately this SNF is about 90 miles away), otherwise it would take 7-10 days to try and get a contract with a SNF that [facility] does not currently have a contract with.  CSW and CM will continue to work on alternate facilities to see if Bipap can be secured as well as daughter agreeing to a bed offer.

## 2021-04-12 NOTE — TOC Progression Note (Signed)
Transition of Care [facility]) - Progression Note    Patient Details  Name: Sheri [name] MRN: [id number] Date of Birth: [birth year]  [facility]) CM/SW [place], LCSW Phone Number:[phone] 04/12/2021, 12:37 PM  Clinical Narrative:     CSW spoke with April at [facility] and she explained that they could look at referral however they do not provide Bipap and pts bring theirs own. CSW explained that pt has been on bipap since at hospital. April stated to follow up tomorrow.  CSW met with [name] and updated her on CSW's efforts over the weekend and the barriers were discussed. [name] informed CSW that discharge options will continued to be explored.  TOC team will continue to assist with discharge planning needs.    Expected Discharge Plan: Skilled Nursing Facility Barriers to Discharge: Ship broker, SNF Pending bed offer  Expected Discharge Plan and Services Expected Discharge Plan: [place] Choice: [place] arrangements for the past 2 months: Single Family Home Expected Discharge Date: 04/09/21                                     Social Determinants of Health (SDOH) Interventions    Readmission Risk Interventions No flowsheet data found.

## 2021-04-12 NOTE — Progress Notes (Signed)
Pt vomited half of crushed 0800 and 1000 meds. Suction was needed d/t very tenacious mucus in the back of pts throat and pt experiencing SHOB. RT and RR contacted. Pt stable.

## 2021-04-13 LAB — CBC WITH DIFFERENTIAL/PLATELET
Abs Immature Granulocytes: 0.05 10*3/uL (ref 0.00–0.07)
Basophils Absolute: 0 10*3/uL (ref 0.0–0.1)
Basophils Relative: 0 %
Eosinophils Absolute: 0.2 10*3/uL (ref 0.0–0.5)
Eosinophils Relative: 2 %
HCT: 28.8 % — ABNORMAL LOW (ref 36.0–46.0)
Hemoglobin: 9.1 g/dL — ABNORMAL LOW (ref 12.0–15.0)
Immature Granulocytes: 1 %
Lymphocytes Relative: 21 %
Lymphs Abs: 1.7 10*3/uL (ref 0.7–4.0)
MCH: 27.2 pg (ref 26.0–34.0)
MCHC: 31.6 g/dL (ref 30.0–36.0)
MCV: 86.2 fL (ref 80.0–100.0)
Monocytes Absolute: 0.9 10*3/uL (ref 0.1–1.0)
Monocytes Relative: 12 %
Neutro Abs: 5.3 10*3/uL (ref 1.7–7.7)
Neutrophils Relative %: 64 %
Platelets: 349 10*3/uL (ref 150–400)
RBC: 3.34 MIL/uL — ABNORMAL LOW (ref 3.87–5.11)
RDW: 16 % — ABNORMAL HIGH (ref 11.5–15.5)
WBC: 8.2 10*3/uL (ref 4.0–10.5)
nRBC: 0 % (ref 0.0–0.2)

## 2021-04-13 LAB — GLUCOSE, CAPILLARY
Glucose-Capillary: 98 mg/dL (ref 70–99)
Glucose-Capillary: 90 mg/dL (ref 70–99)
Glucose-Capillary: 114 mg/dL — ABNORMAL HIGH (ref 70–99)
Glucose-Capillary: 97 mg/dL (ref 70–99)
Glucose-Capillary: 97 mg/dL (ref 70–99)
Glucose-Capillary: 117 mg/dL — ABNORMAL HIGH (ref 70–99)

## 2021-04-13 MED ORDER — POTASSIUM CHLORIDE CRYS ER 20 MEQ PO TBCR
40.0000 meq | EXTENDED_RELEASE_TABLET | Freq: Once | ORAL | Status: AC
  Administered 2021-04-13: 40 meq via ORAL
  Filled 2021-04-13: qty 2

## 2021-04-13 NOTE — Progress Notes (Signed)
PROGRESS NOTE    Sheri [name]  EGB:[id number] DOB: 1925-11-04 DOA: 03/30/2021 PCP: Sheri Rakers, MD   Brief Narrative:  87 year old F with PMH of COPD and chronic RF on 3 L, HTN, DM-2, T11 compression fracture and osteoarthritis presenting with altered mental status for 2 days.  Patient has had a fall 4 days prior to admission.  Per patient's daughter, patient had recurrent falls over the last 4 months with leg giving out.  No history of dementia.    In ED, VBG with acute on chronic respiratory acidosis with hypercapnia. CT head and cervical spine, DG left foot, DG left ankle, b/l knees, thoracic/lumbar spine without acute finding. Patient was admitted for acute metabolic encephalopathy likely from hypercapnia in the setting of COPD exacerbation.  Started on BiPAP and COPD pathway. MRI brain ordered but patient could not tolerate.  Anesthesia did not feel MRI under anesthesia is the safest thing to do at this time given his COPD exacerbation.  Patient's mental status improved but seems to have delirium now.     Therapy recommended SNF.  TOC working on this.  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  CT head without acute finding but cerebral atrophy with chronic microvascular changes.  Basic encephalopathy work-up unrevealing except for mildly elevated ammonia and positive RPR.  Overall, encephalopathy improved after treatment of hypercapnia but seemed to have waxing and waning mental status. On the night of 04/06/2021, rapid response was called since patient was yelling that she could not breathe.  She was seen by nurse practitioner. She was bumped to 3 L.  Early morning 04/07/2021, she was completely lethargic with slurred speech raising suspicion for possible stroke.  MRI and EEG were ordered but not done that day due to her condition.  ABGs were done which once again showed hypercarbia.  She was started on BiPAP and she improved in few hours with improvement in ABG.  EEG did not show epileptiform activities.  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  There was some question about possible stenosis of vertebrobasilar system so MRA of the head and neck was done and stenosis was ruled out.  PCCM was consulted for possible need of home NIV.  They have recommended that patient should have that at home.  All the paperwork is completed.  Patient is medically ready for discharge since the morning of 04/09/2021 however her accepting SNF facility is not able to provide BiPAP as of now and thus discharge is pending.  Patient cannot be discharged home either since [facility] has not been able to deliver NIV at home either. -Avoid or minimize sedating medications -Low RPR titer with positive T. pallidum Ab.  Daughter does not even know what syphilis is and believes that patient has never been tested positive or treated for this.  Discussed with Dr. Earlene Plater of ID who recommends Bicillin 2,400,000 units IM weekly x3 doses.  Received second dose on April 12, 2021.  Acute on chronic respiratory failure respiratory failure with hypercapnia due to COPD exacerbation: Initial VBG showed hypercapnia. -Completed antibiotic and steroid course. -Continue inhalers  and nebulizers -Aspiration precautions   Recurrent falls-reportedly knees giving out. Uses walker at baseline.  No acute finding on imaging. -Voltaren gel and scheduled Tylenol -Fall precautions -Continue PT/OT.  They recommend SNF.   Essential hypertension/pulmonary hypertension: BP fairly  controlled. -Continue home verapamil, metoprolol, HCTZ.  Added scheduled hydralazine here as well as as needed hydralazine.   History of dysphagia: Reportedly had esophageal stretching in the past.  Seen by SLP, currently on dysphagia 3 diet.  Modified barium swallow done.   Chronic T11 compression fracture: slightly progressed to about 50% height loss but no retropulsion.  She has no back pain or neurodeficit.   Controlled NIDDM-2 with mild hyperglycemia: A1c 5.9%.  Blood sugar controlled.  Continue SSI and Tradjenta.   Hypokalemia/hypophosphatemia/hypomagnesemia: Low potassium.  Will replace.  Acute hyponatremia, improved, remains a stable around 130.  Rechecking BMP.   AKI on CKD-3A: Renal function back to baseline now.     Normocytic anemia: H&H stable.  Anemia panel basically normal.  Heterogenicity of thyroid gland -No follow-up needed given age   Goal of care: 87 years of age with COPD and chronic respiratory failure.   Previous hospitalist had discussed the pros and cons of CPR and intubation with patient's daughter, Sheri [name] over the phone who prefers to keep full CODE STATUS for now.   Nutrition Body mass index is 25.97 kg/m. Nutrition Problem: Increased nutrient needs Etiology: chronic illness (COPD) Signs/Symptoms: estimated needs Interventions: Ensure Enlive (each supplement provides 350kcal and 20 grams of protein),MVI  DVT prophylaxis: enoxaparin (LOVENOX) injection 30 mg Start: 04/08/21 1430   Code Status: Full Code  Family Communication: None present at bedside.    Status is: Inpatient  Remains inpatient appropriate because:Unsafe d/c plan   Dispo: The patient is from: Home              Anticipated d/c is to: SNF              Patient currently is medically stable to d/c.   Difficult to place patient No        Estimated body mass index is 24.35 kg/m as calculated from the following:   Height as of this encounter: 5\' 2"  (1.575 m).   Weight as of  this encounter: 60.4 kg.      Nutritional status:  Nutrition Problem: Increased nutrient needs Etiology: chronic illness (COPD)   Signs/Symptoms: estimated needs   Interventions: Ensure Enlive (each supplement provides 350kcal and 20 grams of protein), MVI    Consultants:  PCCM  Procedures:  None  Antimicrobials:  Anti-infectives (From admission, onward)    Start     Dose/Rate Route Frequency Ordered Stop   04/12/21 1000  penicillin g benzathine (BICILLIN LA) 1200000 UNIT/2ML injection 2.4 Million Units        2.4 Million Units Intramuscular Weekly 04/06/21 1018 04/26/21 0959   04/12/21 0000  penicillin g benzathine (BICILLIN LA) 1200000 UNIT/2ML SUSY injection        2.4 Million Units Intramuscular Weekly 04/09/21 1229 04/20/21 2359   04/06/21 1100  penicillin g benzathine (BICILLIN LA) 1200000 UNIT/2ML injection 2.4 Million Units  Status:  Discontinued        2.4 Million Units Intramuscular Weekly 04/06/21 1012 04/06/21 1018   04/05/21 1100  penicillin g procaine-penicillin g benzathine (BICILLIN-CR) injection 600000-600000 units        2.4 Million Units Intramuscular  Once 04/05/21 0949 04/05/21 1511   04/01/21 0400  cefTRIAXone (ROCEPHIN) 1 g in sodium chloride 0.9 % 100  mL IVPB  Status:  Discontinued        1 g 200 mL/hr over 30 Minutes Intravenous Every 24 hours 03/31/21 0424 04/04/21 2132   03/31/21 0430  cefTRIAXone (ROCEPHIN) 1 g in sodium chloride 0.9 % 100 mL IVPB        1 g 200 mL/hr over 30 Minutes Intravenous  Once 03/31/21 0426 03/31/21 0511          Subjective: Seen and examined.  Patient is fully alert and much more oriented today.  No complaints  Objective: Vitals:   04/13/21 0400 04/13/21 0408 04/13/21 0759 04/13/21 1139  BP:  (!) 135/54 (!) 127/51 (!) 106/59  Pulse: 63 63 64 60  Resp: 18 19 (!) 21 16  Temp:  98.7 F (37.1 C) 98.4 F (36.9 C) 98.4 F (36.9 C)  TempSrc:  Oral Axillary Oral  SpO2: 99% 99% 100% 97%  Weight:      Height:         Intake/Output Summary (Last 24 hours) at 04/13/2021 1241 Last data filed at 04/13/2021 0900 Gross per 24 hour  Intake 827.49 ml  Output --  Net 827.49 ml    Filed Weights   04/03/21 0915 04/07/21 0527 04/12/21 0436  Weight: 64.4 kg 63.1 kg 60.4 kg    Examination: General exam: Appears calm and comfortable  Respiratory system: Clear to auscultation. Respiratory effort normal. Cardiovascular system: S1 & S2 heard, RRR. No JVD, murmurs, rubs, gallops or clicks. No pedal edema. Gastrointestinal system: Abdomen is nondistended, soft and nontender. No organomegaly or masses felt. Normal bowel sounds heard. Central nervous system: Alert and oriented. No focal neurological deficits. Extremities: Symmetric 5 x 5 power. Skin: No rashes, lesions or ulcers.     Data Reviewed: I have personally reviewed following labs and imaging studies  CBC: Recent Labs  Lab 04/09/21 0131 04/10/21 0342 04/11/21 0155 04/12/21 0046 04/13/21 0350  WBC 9.6 9.5 10.7* 8.8 8.2  NEUTROABS 5.8 5.9 6.7 5.7 5.3  HGB 10.2* 10.3* 10.3* 9.2* 9.1*  HCT 31.6* 32.7* 32.0* 29.1* 28.8*  MCV 85.2 86.7 85.8 85.1 86.2  PLT 413* 438* 453* 399 349    Basic Metabolic Panel: Recent Labs  Lab 04/07/21 0407 04/08/21 0732 04/09/21 0131 04/10/21 0342 04/12/21 0046  NA 126* 130* 130* 129* 132*  K 3.4* 4.4 3.8 3.6 3.2*  CL 85* 89* 88* 92* 94*  CO2 34* 33* 32 27 30  GLUCOSE 142* 87 127* 118* 82  BUN 18 16 22 17 14   CREATININE 1.01* 1.13* 1.37* 1.22* 1.02*  CALCIUM 9.1 9.1 9.1 9.0 8.6*  MG 2.0  --   --   --   --     GFR: Estimated Creatinine Clearance: 30.1 mL/min (A) (by C-G formula based on SCr of 1.02 mg/dL (H)). Liver Function Tests: Recent Labs  Lab 04/08/21 0732 04/09/21 0131  AST 26 26  ALT 13 16  ALKPHOS 51 48  BILITOT 0.7 0.5  PROT 6.5 6.3*  ALBUMIN 2.8* 2.7*    No results for input(s): LIPASE, AMYLASE in the last 168 hours. No results for input(s): AMMONIA in the last 168  hours.  Coagulation Profile: No results for input(s): INR, PROTIME in the last 168 hours. Cardiac Enzymes: No results for input(s): CKTOTAL, CKMB, CKMBINDEX, TROPONINI in the last 168 hours.  BNP (last 3 results) No results for input(s): PROBNP in the last 8760 hours. HbA1C: No results for input(s): HGBA1C in the last 72 hours. CBG: Recent Labs  Lab 04/12/21  2000 04/12/21 2303 04/13/21 0406 04/13/21 0756 04/13/21 1144  GLUCAP 115* 117* 98 90 114*    Lipid Profile: No results for input(s): CHOL, HDL, LDLCALC, TRIG, CHOLHDL, LDLDIRECT in the last 72 hours. Thyroid Function Tests: No results for input(s): TSH, T4TOTAL, FREET4, T3FREE, THYROIDAB in the last 72 hours. Anemia Panel: No results for input(s): VITAMINB12, FOLATE, FERRITIN, TIBC, IRON, RETICCTPCT in the last 72 hours. Sepsis Labs: No results for input(s): PROCALCITON, LATICACIDVEN in the last 168 hours.  Recent Results (from the past 240 hour(s))  Resp Panel by RT-PCR (Flu A&B, Covid) Nasopharyngeal Swab     Status: None   Collection Time: 04/06/21  1:26 PM   Specimen: Nasopharyngeal Swab; Nasopharyngeal(NP) swabs in vial transport medium  Result Value Ref Range Status   SARS Coronavirus 2 by RT PCR NEGATIVE NEGATIVE Final    Comment: (NOTE) SARS-CoV-2 target nucleic acids are NOT DETECTED.  The SARS-CoV-2 RNA is generally detectable in upper respiratory specimens during the acute phase of infection. The lowest concentration of SARS-CoV-2 viral copies this assay can detect is 138 copies/mL. A negative result does not preclude SARS-Cov-2 infection and should not be used as the sole basis for treatment or other patient management decisions. A negative result may occur with  improper specimen collection/handling, submission of specimen other than nasopharyngeal swab, presence of viral mutation(s) within the areas targeted by this assay, and inadequate number of viral copies(<138 copies/mL). A negative result must  be combined with clinical observations, patient history, and epidemiological information. The expected result is Negative.  Fact Sheet for Patients:  BloggerCourse.com  Fact Sheet for Healthcare Providers:  SeriousBroker.it  This test is no t yet approved or cleared by the [place] FDA and  has been authorized for detection and/or diagnosis of SARS-CoV-2 by FDA under an Emergency Use Authorization (EUA). This EUA will remain  in effect (meaning this test can be used) for the duration of the COVID-19 declaration under Section 564(b)(1) of the Act, 21 U.S.C.section 360bbb-3(b)(1), unless the authorization is terminated  or revoked sooner.       Influenza A by PCR NEGATIVE NEGATIVE Final   Influenza B by PCR NEGATIVE NEGATIVE Final    Comment: (NOTE) The Xpert Xpress SARS-CoV-2/FLU/RSV plus assay is intended as an aid in the diagnosis of influenza from Nasopharyngeal swab specimens and should not be used as a sole basis for treatment. Nasal washings and aspirates are unacceptable for Xpert Xpress SARS-CoV-2/FLU/RSV testing.  Fact Sheet for Patients: BloggerCourse.com  Fact Sheet for Healthcare Providers: SeriousBroker.it  This test is not yet approved or cleared by the [place] FDA and has been authorized for detection and/or diagnosis of SARS-CoV-2 by FDA under an Emergency Use Authorization (EUA). This EUA will remain in effect (meaning this test can be used) for the duration of the COVID-19 declaration under Section 564(b)(1) of the Act, 21 U.S.C. section 360bbb-3(b)(1), unless the authorization is terminated or revoked.  Performed at [facility], [address], [place], [place] [address]       Radiology Studies: DG Chest Port 1 View  Result Date: 04/12/2021 CLINICAL DATA:  Shortness of breath. EXAM: PORTABLE CHEST 1 VIEW COMPARISON:  04/06/2021,  04/17/2019 FINDINGS: Patient slightly rotated to the right. Lungs are hyperexpanded without focal airspace consolidation, effusion or pneumothorax. Cardiomediastinal silhouette and remainder of the exam is unchanged. IMPRESSION: No acute disease. Electronically Signed   By: Elberta Fortis M.D.   On: 04/12/2021 11:59     Scheduled Meds:  acetaminophen  500 mg Oral Q8H   diclofenac Sodium  2 g Topical QID   enoxaparin (LOVENOX) injection  30 mg Subcutaneous Q24H   ferrous sulfate  325 mg Oral Q breakfast   fluticasone furoate-vilanterol  1 puff Inhalation Daily   And   umeclidinium bromide  1 puff Inhalation Daily   guaiFENesin  600 mg Oral BID   hydrALAZINE  25 mg Oral Q8H   hydrochlorothiazide  12.5 mg Oral Daily   insulin aspart  0-9 Units Subcutaneous Q4H   latanoprost  1 drop Right Eye QHS   linagliptin  5 mg Oral Daily   mouth rinse  15 mL Mouth Rinse BID   metoprolol succinate  50 mg Oral Daily   mirtazapine  7.5 mg Oral QHS   multivitamin with minerals  1 tablet Oral Daily   pantoprazole  40 mg Oral Daily   penicillin g benzathine (BICILLIN-LA) IM  2.4 Million Units Intramuscular Weekly   rosuvastatin  20 mg Oral QHS   verapamil  240 mg Oral QHS   Continuous Infusions:  lactated ringers 75 mL/hr at 04/12/21 2227     LOS: 13 days   Time spent: 24 minutes   Hughie Closs, MD [facility]  04/13/2021, 12:41 PM   How to contact the [facility] Attending or Consulting provider 7A - 7P or covering provider during after hours 7P -7A, for this patient?  Check the care team in [facility] and look for a) attending/consulting TRH provider listed and b) the [facility] team listed. Page or secure chat 7A-7P. Log into [email or web address] and use [place]'s universal password to access. If you do not have the password, please contact the hospital operator. Locate the [facility] provider you are looking for under [facility] and page to a number that you can be directly reached. If you still have  difficulty reaching the provider, please page the [facility] (Director on Call) for the Hospitalists listed on amion for assistance.

## 2021-04-13 NOTE — Progress Notes (Signed)
Plan of care reviewed. Pt has been progressing. She is alert and oriented x 4, cooperative. NSR on EKG monitor. Her vital signs stale, remains afebrile. On 1-2 LPM of O2 NCL, her SPO2 98-99%, no respiratory distress noted tonight. We will monitor.  Filiberto Pinks, RN

## 2021-04-13 NOTE — TOC Progression Note (Signed)
Transition of Care [facility]) - Progression Note    Patient Details  Name: Sheri [name] MRN: [id number] Date of Birth: 1926-02-05  Transition of Care [facility]) CM/SW Contact  Eduard R[place], [place] Phone Number: 04/13/2021, 1:50 PM  Clinical Narrative:     [facility] can not secure bi-pap  CSW spoke w/patient daughter- updated on placement- she believes the patient has improved and inquired about going to SNF w/o bipap since the patient is refusing bipap here at the hospital. Updated MD & RN- MD confirmed patient must discharge w/bipap to SNF-   Family expressed no interest in other SNF facilities([facility])- stating "if all else fails, we will take her home". She states family is able to provide 24/7 care.    CSW will continue to follow and assist with discharge planning.   Antony Blackbird, MSW, LCSW Clinical Social Worker     Expected Discharge Plan: Skilled Nursing Facility Barriers to Discharge: English as a second language teacher, SNF Pending bed offer  Expected Discharge Plan and Services Expected Discharge Plan: Skilled Nursing Facility     Post Acute Care Choice: Skilled Nursing Facility Living arrangements for the past 2 months: Single Family Home Expected Discharge Date: 04/09/21                                     Social Determinants of Health (SDOH) Interventions    Readmission Risk Interventions No flowsheet data found.

## 2021-04-13 NOTE — Care Management Important Message (Signed)
Important Message  Patient Details  Name: Sheri [name] MRN: [id number] Date of Birth: [birth year]   Medicare Important Message Given:  Yes     Renie Ora 04/13/2021, 10:32 AM

## 2021-04-13 NOTE — Progress Notes (Signed)
SLP Cancellation Note  Patient Details Name: Sheri [name] MRN: [id number] DOB: 1926/05/24   Cancelled treatment:       Reason Eval/Treat Not Completed: Fatigue/lethargy limiting ability to participate   Tressie Stalker, M.S., CCC-SLP 04/13/2021, 3:23 PM

## 2021-04-14 DIAGNOSIS — S22080A Wedge compression fracture of T11-T12 vertebra, initial encounter for closed fracture: Secondary | ICD-10-CM

## 2021-04-14 DIAGNOSIS — Z7189 Other specified counseling: Secondary | ICD-10-CM

## 2021-04-14 LAB — GLUCOSE, CAPILLARY
Glucose-Capillary: 98 mg/dL (ref 70–99)
Glucose-Capillary: 99 mg/dL (ref 70–99)
Glucose-Capillary: 93 mg/dL (ref 70–99)
Glucose-Capillary: 116 mg/dL — ABNORMAL HIGH (ref 70–99)
Glucose-Capillary: 106 mg/dL — ABNORMAL HIGH (ref 70–99)

## 2021-04-14 LAB — BASIC METABOLIC PANEL
Anion gap: 8 (ref 5–15)
BUN: 9 mg/dL (ref 8–23)
CO2: 29 mmol/L (ref 22–32)
Calcium: 8.5 mg/dL — ABNORMAL LOW (ref 8.9–10.3)
Chloride: 94 mmol/L — ABNORMAL LOW (ref 98–111)
Creatinine, Ser: 1.06 mg/dL — ABNORMAL HIGH (ref 0.44–1.00)
GFR, Estimated: 49 mL/min — ABNORMAL LOW (ref >60)
Glucose, Bld: 94 mg/dL (ref 70–99)
Potassium: 3.6 mmol/L (ref 3.5–5.1)
Sodium: 131 mmol/L — ABNORMAL LOW (ref 135–145)

## 2021-04-14 NOTE — Progress Notes (Signed)
Pts daughter wants to discuss BIPAP with MD. Paged MD.

## 2021-04-14 NOTE — Progress Notes (Signed)
Occupational Therapy Treatment Patient Details Name: Sheri [name] MRN: [id number] DOB: 15-Apr-1926 Today's Date: 04/14/2021    History of present illness Pt is a 87 year old woman admitted on 03/31/21 with AMS, COPD exacerbation  and falls. Pt injured her L ankle/foot and hit her head in a fall. Head CT negative, MRI pending. PMH: COPD on 3L at home, T11 compression fx, HTN, AAA.   OT comments  Pt progressing gradually towards OT goals, pleasant and agreeable to participate. Pt able to mobilize to bathroom using RW today initially with Min A but progressed to require Mod A due to progressive fatigue and difficulty maneuvering RW around obstacles. Pt overall Mod A for toileting task and requires hands-on assist consistently in standing to ensure safety and decrease fall risk. Continue to recommend SNF for short term rehab prior to DC home.   SpO2 on RA 92% with activity, 3/4 DOE HR 50s-60sbpm during activity.    Follow Up Recommendations  SNF;Supervision/Assistance - 24 hour    Equipment Recommendations  3 in 1 bedside commode    Recommendations for Other Services      Precautions / Restrictions Precautions Precautions: Fall Precaution Comments: frequent falls at home (2-3x week); modified diet Restrictions Weight Bearing Restrictions: No       Mobility Bed Mobility Overal bed mobility: Needs Assistance Bed Mobility: Supine to Sit     Supine to sit: Supervision;HOB elevated     General bed mobility comments: increased time/effort    Transfers Overall transfer level: Needs assistance Equipment used: Rolling walker (2 wheeled) Transfers: Sit to/from [employer] Sit to Stand: Min assist Stand pivot transfers: Mod assist       General transfer comment: Min A overall for sit to stand transfers, cues for hand placement. Pt able to mobilize to bathroom with Mod A needed for successful pivoting to toilet to maintain balance and use RW effectively due to pt  tendency to pick up or leave behind    Balance Overall balance assessment: Needs assistance Sitting-balance support: No upper extremity supported;Feet supported Sitting balance-Leahy Scale: Fair     Standing balance support: Bilateral upper extremity supported Standing balance-Leahy Scale: Poor Standing balance comment: reliant on UE support in standing                           ADL either performed or assessed with clinical judgement   ADL Overall ADL's : Needs assistance/impaired                         Toilet Transfer: Moderate assistance;Ambulation;RW Toilet Transfer Details (indicate cue type and reason): BSC over toilet. Initially Min A for RW mgmt in mobility but required Mod A by end of mobility bout due to fatigue to maintain balance and turn RW in small space Toileting- Clothing Manipulation and Hygiene: Moderate assistance;Sit to/from stand Toileting - Clothing Manipulation Details (indicate cue type and reason): Overall mod A, able to assist with clothing mgmt and wipe via lateral leans after urination. Assist for thoroughness after task     Functional mobility during ADLs: Moderate assistance;Rolling walker;Cueing for sequencing;Cueing for safety General ADL Comments: Pt able to progress mobility to bathroom today though became very fatigued and noted SOB by end of task. Consistent cues needed for safe sequencing and DME use     Vision   Vision Assessment?: No apparent visual deficits   Perception     Praxis  Cognition Arousal/Alertness: Awake/alert Behavior During Therapy: Flat affect Overall Cognitive Status: Impaired/Different from baseline Area of Impairment: Memory;Following commands;Safety/judgement;Awareness;Problem solving;Orientation;Attention                 Orientation Level: Disoriented to;Time;Situation Current Attention Level: Sustained Memory: Decreased short-term memory Following Commands: Follows one step  commands with increased time Safety/Judgement: Decreased awareness of safety;Decreased awareness of deficits Awareness: Emergent Problem Solving: Slow processing;Decreased initiation;Difficulty sequencing;Requires verbal cues;Requires tactile cues General Comments: Pt pleasant and responds well to humor. Increased time and repetition needed to follow instructions. Continued poor safety awreness and difficulty following directions in using RW        Exercises     Shoulder Instructions       General Comments Pt received on 2 L O2, with O2 96% trialed on RA with 3/4 DOE noted but O2 at 92%. HR 50s-60sbpm    Pertinent Vitals/ Pain       Pain Assessment: No/denies pain  Home Living                                          Prior Functioning/Environment              Frequency  Min 2X/week        Progress Toward Goals  OT Goals(current goals can now be found in the care plan section)  Progress towards OT goals: Progressing toward goals  Acute Rehab OT Goals Patient Stated Goal: pt unable to state, daughter wants her to do therapy and get stronger so she can get back home soon. OT Goal Formulation: With family Time For Goal Achievement: 04/29/21 Potential to Achieve Goals: Fair ADL Goals Pt Will Perform Upper Body Dressing: with set-up;sitting Pt Will Perform Lower Body Dressing: with min assist;sit to/from stand  Plan Discharge plan remains appropriate    Co-evaluation                 AM-PAC OT "6 Clicks" Daily Activity     Outcome Measure   Help from another person eating meals?: A Little Help from another person taking care of personal grooming?: A Little Help from another person toileting, which includes using toliet, bedpan, or urinal?: A Lot Help from another person bathing (including washing, rinsing, drying)?: A Lot Help from another person to put on and taking off regular upper body clothing?: A Little Help from another person to  put on and taking off regular lower body clothing?: A Lot 6 Click Score: 15    End of Session Equipment Utilized During Treatment: Gait belt;Rolling walker  OT Visit Diagnosis: Pain;Muscle weakness (generalized) (M62.81);Unsteadiness on feet (R26.81);Other symptoms and signs involving cognitive function   Activity Tolerance Patient limited by fatigue   Patient Left in chair;with call bell/phone within reach;with chair alarm set;with family/visitor present;with nursing/sitter in room   Nurse Communication Mobility status        Time: 1135-1200 OT Time Calculation (min): 25 min  Charges: OT General Charges $OT Visit: 1 Visit OT Treatments $Self Care/Home Management : 23-37 mins  Bradd Canary, OTR/L Acute Rehab Services Office: [phone]    Lorre Munroe 04/14/2021, 12:55 PM

## 2021-04-14 NOTE — Progress Notes (Signed)
PROGRESS NOTE    Sheri [name]  ZOX:[id number] DOB: July 20, 1926 DOA: 03/30/2021 PCP: Renaye Rakers, MD   Brief Narrative:  87 year old F with PMH of COPD and chronic RF on 3 L, HTN, DM-2, T11 compression fracture and osteoarthritis presented with altered mental status for 2 days.  Patient has had a fall 4 days prior to admission.  Per patient's daughter, patient had recurrent falls over the last 4 months with leg giving out.  No history of dementia.    In ED, VBG with acute on chronic respiratory acidosis with hypercapnia. CT head and cervical spine, DG left foot, DG left ankle, b/l knees, thoracic/lumbar spine without acute finding. Patient was admitted for acute metabolic encephalopathy likely from hypercapnia in the setting of COPD exacerbation.  Started on BiPAP and COPD pathway. MRI brain negative for any acute stroke   Patient's mental status has been waxing and waning and it is mainly due to CO2 retention when she does not wear CPAP machine.  PCCM was consulted and they recommended patient to go with home NIV.  Currently TOC looking for SNF placement but no SNF is available to provide BiPAP machine and she is not yet approved for home BiPAP machine either.  TOC working on all those issues.  She is medically stable for discharge.   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  CT head without acute finding but cerebral atrophy with chronic microvascular changes.  Basic encephalopathy work-up unrevealing except for mildly elevated ammonia and positive RPR.  Overall, encephalopathy improved after treatment of hypercapnia but seemed to have waxing and waning  mental status. On the night of 04/06/2021, rapid response was called since patient was yelling that she could not breathe.  She was seen by nurse practitioner. She was bumped to 3 L. Early morning 04/07/2021, she was completely lethargic with slurred speech raising suspicion for possible stroke.  MRI and EEG were ordered but not done that day due to her condition.  ABGs were done which once again showed hypercarbia.  She was started on BiPAP and she improved in few hours with improvement in ABG.  EEG did not show epileptiform activities.  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  There was some question about possible stenosis of vertebrobasilar system so MRA of the head and neck was done and stenosis was ruled out.  PCCM was consulted for possible need of home NIV.  They have recommended that patient should have that at home.  All the paperwork is completed.  Patient is medically ready for discharge since the morning of 04/09/2021 however her accepting SNF facility is not able to provide BiPAP as of now and thus discharge is pending.  Patient cannot be discharged home either since [facility] has not been able to deliver NIV at home either.  For the past 3 days, patient has remained alert and oriented mostly.  Her baseline orientation is x2. -Avoid or minimize sedating medications -Low RPR titer with positive T. pallidum Ab.  Daughter does not even know what syphilis is and believes that patient has never been tested  positive or treated for this.  Discussed with Dr. Earlene Plater of ID who recommends Bicillin 2,400,000 units IM weekly x3 doses.  Received second dose on April 12, 2021.  Acute on chronic respiratory failure respiratory failure with hypercapnia due to COPD exacerbation: Initial VBG showed hypercapnia. -Completed antibiotic and steroid course. -Continue inhalers and  nebulizers -Aspiration precautions   Recurrent falls-reportedly knees giving out. Uses walker at baseline.  No acute finding on imaging. -Voltaren gel and scheduled Tylenol -Fall precautions -Continue PT/OT.  They recommend SNF.   Essential hypertension/pulmonary hypertension: BP fairly controlled. -Continue home verapamil, metoprolol, HCTZ.  Added scheduled hydralazine here as well as as needed hydralazine.   History of dysphagia: Reportedly had esophageal stretching in the past.  Seen by SLP, currently on dysphagia 3 diet.  Modified barium swallow done.   Chronic T11 compression fracture: slightly progressed to about 50% height loss but no retropulsion.  She has no back pain or neurodeficit.   Controlled NIDDM-2 with mild hyperglycemia: A1c 5.9%.  Blood sugar controlled.  Continue SSI and Tradjenta.   Hypokalemia/hypophosphatemia/hypomagnesemia: Resolved  Acute hyponatremia, improved, remains a stable around 131.     AKI on CKD-3A: Renal function back to baseline now.     Normocytic anemia: H&H stable.  Anemia panel basically normal.  Heterogenicity of thyroid gland -No follow-up needed given age   Goal of care: 87 years of age with COPD and chronic respiratory failure.   Previous hospitalist had discussed the pros and cons of CPR and intubation with patient's daughter, [name] over the phone who prefers to keep full CODE STATUS for now.   Nutrition Body mass index is 25.97 kg/m. Nutrition Problem: Increased nutrient needs Etiology: chronic illness (COPD) Signs/Symptoms: estimated needs Interventions: Ensure Enlive (each supplement provides 350kcal and 20 grams of protein),MVI  DVT prophylaxis: enoxaparin (LOVENOX) injection 30 mg Start: 04/08/21 1430   Code Status: Full Code  Family Communication: None present at bedside.  Discussed with daughter over the phone  Status is: Inpatient  Remains inpatient appropriate because:Unsafe d/c plan   Dispo: The patient is from:  Home              Anticipated d/c is to: SNF              Patient currently is medically stable to d/c.   Difficult to place patient No        Estimated body mass index is 24.35 kg/m as calculated from the following:   Height as of this encounter:  (1.575 m).   Weight as of this encounter: 60.4 kg.      Nutritional status:  Nutrition Problem: Increased nutrient needs Etiology: chronic illness (COPD)   Signs/Symptoms: estimated needs   Interventions: Ensure Enlive (each supplement provides 350kcal and 20 grams of protein), MVI    Consultants:  PCCM, signed off  Procedures:  None  Antimicrobials:  Anti-infectives (From admission, onward)    Start     Dose/Rate Route Frequency Ordered Stop   04/12/21 1000  penicillin g benzathine (BICILLIN LA) 1200000 UNIT/2ML injection 2.4 Million Units        2.4 Million Units Intramuscular Weekly 04/06/21 1018 04/26/21 0959   04/12/21 0000  penicillin g benzathine (BICILLIN LA) 1200000 UNIT/2ML SUSY injection        2.4 Million Units Intramuscular Weekly 04/09/21 1229 04/20/21 2359   04/06/21 1100  penicillin g benzathine (BICILLIN LA) 1200000 UNIT/2ML injection 2.4 Million Units  Status:  Discontinued  2.4 Million Units Intramuscular Weekly 04/06/21 1012 04/06/21 1018   04/05/21 1100  penicillin g procaine-penicillin g benzathine (BICILLIN-CR) injection 600000-600000 units        2.4 Million Units Intramuscular  Once 04/05/21 0949 04/05/21 1511   04/01/21 0400  cefTRIAXone (ROCEPHIN) 1 g in sodium chloride 0.9 % 100 mL IVPB  Status:  Discontinued        1 g 200 mL/hr over 30 Minutes Intravenous Every 24 hours 03/31/21 0424 04/04/21 2132   03/31/21 0430  cefTRIAXone (ROCEPHIN) 1 g in sodium chloride 0.9 % 100 mL IVPB        1 g 200 mL/hr over 30 Minutes Intravenous  Once 03/31/21 0426 03/31/21 0511          Subjective: Patient seen and examined.  She has no complaints.  She is fully alert and oriented  x2  Objective: Vitals:   04/14/21 0018 04/14/21 0413 04/14/21 0741 04/14/21 0842  BP:  (!) 135/52 (!) 152/89   Pulse: (!) 55 63 67   Resp: (!) 23 20 (!) 23   Temp:  98.6 F (37 C) 98.1 F (36.7 C)   TempSrc:  Oral Oral   SpO2: 90% 90% 94% 100%  Weight:      Height:        Intake/Output Summary (Last 24 hours) at 04/14/2021 1044 Last data filed at 04/14/2021 1029 Gross per 24 hour  Intake 2509.47 ml  Output 1050 ml  Net 1459.47 ml    Filed Weights   04/03/21 0915 04/07/21 0527 04/12/21 0436  Weight: 64.4 kg 63.1 kg 60.4 kg    Examination: General exam: Appears calm and comfortable  Respiratory system: Clear to auscultation. Respiratory effort normal. Cardiovascular system: S1 & S2 heard, RRR. No JVD, murmurs, rubs, gallops or clicks. No pedal edema. Gastrointestinal system: Abdomen is nondistended, soft and nontender. No organomegaly or masses felt. Normal bowel sounds heard. Central nervous system: Alert and oriented x2. No focal neurological deficits. Extremities: Symmetric 5 x 5 power. Skin: No rashes, lesions or ulcers.  Psychiatry: Judgement and insight appear poor   Data Reviewed: I have personally reviewed following labs and imaging studies  CBC: Recent Labs  Lab 04/09/21 0131 04/10/21 0342 04/11/21 0155 04/12/21 0046 04/13/21 0350  WBC 9.6 9.5 10.7* 8.8 8.2  NEUTROABS 5.8 5.9 6.7 5.7 5.3  HGB 10.2* 10.3* 10.3* 9.2* 9.1*  HCT 31.6* 32.7* 32.0* 29.1* 28.8*  MCV 85.2 86.7 85.8 85.1 86.2  PLT 413* 438* 453* 399 349    Basic Metabolic Panel: Recent Labs  Lab 04/08/21 0732 04/09/21 0131 04/10/21 0342 04/12/21 0046 04/14/21 0149  NA 130* 130* 129* 132* 131*  K 4.4 3.8 3.6 3.2* 3.6  CL 89* 88* 92* 94* 94*  CO2 33* 32 27 30 29   GLUCOSE 87 127* 118* 82 94  BUN 16 22 17 14 9   CREATININE 1.13* 1.37* 1.22* 1.02* 1.06*  CALCIUM 9.1 9.1 9.0 8.6* 8.5*    GFR: Estimated Creatinine Clearance: 29 mL/min (A) (by C-G formula based on SCr of 1.06 mg/dL  (H)). Liver Function Tests: Recent Labs  Lab 04/08/21 0732 04/09/21 0131  AST 26 26  ALT 13 16  ALKPHOS 51 48  BILITOT 0.7 0.5  PROT 6.5 6.3*  ALBUMIN 2.8* 2.7*    No results for input(s): LIPASE, AMYLASE in the last 168 hours. No results for input(s): AMMONIA in the last 168 hours.  Coagulation Profile: No results for input(s): INR, PROTIME in the last 168 hours. Cardiac  Enzymes: No results for input(s): CKTOTAL, CKMB, CKMBINDEX, TROPONINI in the last 168 hours.  BNP (last 3 results) No results for input(s): PROBNP in the last 8760 hours. HbA1C: No results for input(s): HGBA1C in the last 72 hours. CBG: Recent Labs  Lab 04/13/21 1558 04/13/21 1946 04/13/21 2310 04/14/21 0417 04/14/21 0742  GLUCAP 97 97 117* 98 99    Lipid Profile: No results for input(s): CHOL, HDL, LDLCALC, TRIG, CHOLHDL, LDLDIRECT in the last 72 hours. Thyroid Function Tests: No results for input(s): TSH, T4TOTAL, FREET4, T3FREE, THYROIDAB in the last 72 hours. Anemia Panel: No results for input(s): VITAMINB12, FOLATE, FERRITIN, TIBC, IRON, RETICCTPCT in the last 72 hours. Sepsis Labs: No results for input(s): PROCALCITON, LATICACIDVEN in the last 168 hours.  Recent Results (from the past 240 hour(s))  Resp Panel by RT-PCR (Flu A&B, Covid) Nasopharyngeal Swab     Status: None   Collection Time: 04/06/21  1:26 PM   Specimen: Nasopharyngeal Swab; Nasopharyngeal(NP) swabs in vial transport medium  Result Value Ref Range Status   SARS Coronavirus 2 by RT PCR NEGATIVE NEGATIVE Final    Comment: (NOTE) SARS-CoV-2 target nucleic acids are NOT DETECTED.  The SARS-CoV-2 RNA is generally detectable in upper respiratory specimens during the acute phase of infection. The lowest concentration of SARS-CoV-2 viral copies this assay can detect is 138 copies/mL. A negative result does not preclude SARS-Cov-2 infection and should not be used as the sole basis for treatment or other patient management  decisions. A negative result may occur with  improper specimen collection/handling, submission of specimen other than nasopharyngeal swab, presence of viral mutation(s) within the areas targeted by this assay, and inadequate number of viral copies(<138 copies/mL). A negative result must be combined with clinical observations, patient history, and epidemiological information. The expected result is Negative.  Fact Sheet for Patients:  BloggerCourse.com  Fact Sheet for Healthcare Providers:  SeriousBroker.it  This test is no t yet approved or cleared by the [place] FDA and  has been authorized for detection and/or diagnosis of SARS-CoV-2 by FDA under an Emergency Use Authorization (EUA). This EUA will remain  in effect (meaning this test can be used) for the duration of the COVID-19 declaration under Section 564(b)(1) of the Act, 21 U.S.C.section 360bbb-3(b)(1), unless the authorization is terminated  or revoked sooner.       Influenza A by PCR NEGATIVE NEGATIVE Final   Influenza B by PCR NEGATIVE NEGATIVE Final    Comment: (NOTE) The Xpert Xpress SARS-CoV-2/FLU/RSV plus assay is intended as an aid in the diagnosis of influenza from Nasopharyngeal swab specimens and should not be used as a sole basis for treatment. Nasal washings and aspirates are unacceptable for Xpert Xpress SARS-CoV-2/FLU/RSV testing.  Fact Sheet for Patients: BloggerCourse.com  Fact Sheet for Healthcare Providers: SeriousBroker.it  This test is not yet approved or cleared by the [place] FDA and has been authorized for detection and/or diagnosis of SARS-CoV-2 by FDA under an Emergency Use Authorization (EUA). This EUA will remain in effect (meaning this test can be used) for the duration of the COVID-19 declaration under Section 564(b)(1) of the Act, 21 U.S.C. section 360bbb-3(b)(1), unless the  authorization is terminated or revoked.  Performed at [facility], [address], [place], [place] [address]       Radiology Studies: DG Chest Port 1 View  Result Date: 04/12/2021 CLINICAL DATA:  Shortness of breath. EXAM: PORTABLE CHEST 1 VIEW COMPARISON:  04/06/2021, 04/17/2019 FINDINGS: Patient slightly rotated to  the right. Lungs are hyperexpanded without focal airspace consolidation, effusion or pneumothorax. Cardiomediastinal silhouette and remainder of the exam is unchanged. IMPRESSION: No acute disease. Electronically Signed   By: Elberta Fortis M.D.   On: 04/12/2021 11:59     Scheduled Meds:  acetaminophen  500 mg Oral Q8H   diclofenac Sodium  2 g Topical QID   enoxaparin (LOVENOX) injection  30 mg Subcutaneous Q24H   ferrous sulfate  325 mg Oral Q breakfast   fluticasone furoate-vilanterol  1 puff Inhalation Daily   And   umeclidinium bromide  1 puff Inhalation Daily   guaiFENesin  600 mg Oral BID   hydrALAZINE  25 mg Oral Q8H   hydrochlorothiazide  12.5 mg Oral Daily   insulin aspart  0-9 Units Subcutaneous Q4H   latanoprost  1 drop Right Eye QHS   linagliptin  5 mg Oral Daily   mouth rinse  15 mL Mouth Rinse BID   metoprolol succinate  50 mg Oral Daily   mirtazapine  7.5 mg Oral QHS   multivitamin with minerals  1 tablet Oral Daily   pantoprazole  40 mg Oral Daily   penicillin g benzathine (BICILLIN-LA) IM  2.4 Million Units Intramuscular Weekly   rosuvastatin  20 mg Oral QHS   verapamil  240 mg Oral QHS   Continuous Infusions:  lactated ringers 75 mL/hr at 04/13/21 2302     LOS: 14 days   Time spent: 25 minutes   Hughie Closs, MD [facility]  04/14/2021, 10:44 AM   How to contact the [facility] Attending or Consulting provider 7A - 7P or covering provider during after hours 7P -7A, for this patient?  Check the care team in [facility] and look for a) attending/consulting [facility] provider listed and b) the [facility] team listed. Page or secure chat 7A-7P. Log  into [email or web address] and use [facility] universal password to access. If you do not have the password, please contact the hospital operator. Locate the [facility] provider you are looking for under [facility] and page to a number that you can be directly reached. If you still have difficulty reaching the provider, please page the [facility] (Director on Call) for the Hospitalists listed on amion for assistance.

## 2021-04-14 NOTE — Consult Note (Signed)
Consultation Note Date: 04/14/2021   Patient Name: Sheri [name]  DOB: 1926-03-25  MRN: [id number]  Age / Sex: 87 y.o., female  PCP: Sheri Rakers, MD Referring Physician: Hughie Closs, MD  Reason for Consultation: Establishing goals of care  HPI/Patient Profile: 87 y.o. female  with past medical history of COPD on 3L O2 at baseline, HTN, chronic respiratory failure, AAA, admitted on 03/30/2021 with acute encephalopathy and fall.  Patient's mental status has improved to baseline (oriented x2) with BiPAP and treatment of COPD exacerbation. She is awaiting discharge to either SNF with BiPAP or home with NIV. Palliative care has been consulted to assist with goals of care discussion.  Clinical Assessment and Goals of Care:  I have reviewed medical records including EPIC notes, labs and imaging, assessed the patient at the bedside and then spoke with her daughter Sheri [name] via phone to discuss diagnosis, prognosis, GOC, EOL wishes, disposition and options.  I introduced Palliative Medicine as specialized medical care for people living with serious illness. It focuses on providing relief from the symptoms and stress of a serious illness. The goal is to improve quality of life for both the patient and the family.  We discussed a brief life review of the patient and then focused on their current illness. Sheri [name] was married for over 50 years until her husband unfortunately passed 4 years ago. She has had 8 children, although two are deceased as well. She reports that she is a [place] [employer] the [employer] denomination. She feels much better and would like to be able to stand and walk without feeling like she will fall. She states that she is a "home girl" and strongly prefers to go back home as soon as she can. She understands that we are currently working on the safest plan for her discharge. She gives me permission to call  her daughter Sheri [name] and provide support.  I attempted to elicit values and goals of care important to the patient.   Sheri [name] shares her frustration with the care plan, particularly the BiPAP recommendation. She has been watching her closely and feels that Sheri [name] has been doing well on room air for several hours during her visits. She does not agree with assessment that patient needs BiPAP at discharge. She values good communication and also voices her desire that the patient continues to receive physical therapy while awaiting next steps for discharge. She is also worried that Sheri [name] is not eating well enough for adequate nutrition due to distaste for hospital food. She states that her goal is to eventually bring her mother back home, but she prefers short-term rehab first to optimize her strength and mobility. She is agreeable to meet in person tomorrow afternoon and discuss further.    Discussed the importance of continued conversation with family and the medical providers regarding overall plan of care and treatment options, ensuring decisions are within the context of the patient's values and GOCs.    Questions and concerns were addressed. The family was encouraged to call with questions or  concerns.  PMT will continue to support holistically.   NEXT OF KIN are the majority of patient's adult children. Daughter Sheri [name] reports that she is Product manager and will bring in documentation tomorrow.    SUMMARY OF RECOMMENDATIONS   -Full code/full scope -Ongoing discussions and education -Goals are clear for short-term SNF then home -Daughter Sheri [name] will bring in [facility] documentation tomorrow -I will follow up again tomorrow and provide additional support  Code Status/Advance Care Planning: Full code  Palliative Prophylaxis:  Bowel Regimen and Delirium Protocol  Additional Recommendations (Limitations, Scope, Preferences): Full Scope Treatment  Psycho-social/Spiritual:  Desire for further  Chaplaincy support:not discussed Additional Recommendations: Referral to Community Resources   Prognosis:  Guarded prognosis, non-compliant with BiPAP at times, advanced age, frailty  Discharge Planning: Skilled Nursing Facility for rehab with Palliative care service follow-up      Primary Diagnoses: Present on Admission:  Chronic obstructive pulmonary disease (HCC)  Chronic respiratory failure with hypoxia (HCC)  Acute on chronic respiratory failure with hypercapnia (HCC)  Acute metabolic encephalopathy  Essential hypertension  COPD exacerbation (HCC)   I have reviewed the medical record, interviewed the patient and family, and examined the patient. The following aspects are pertinent.  Past Medical History:  Diagnosis Date   Bronchitis    COPD (chronic obstructive pulmonary disease) (HCC)    Hypertension    Social History   Socioeconomic History   Marital status: Widowed    Spouse name: Not on file   Number of children: 8   Years of education: Not on file   Highest education level: Not on file  Occupational History   Not on file  Tobacco Use   Smoking status: Former    Packs/day: 0.50    Pack years: 0.00    Types: Cigarettes    Quit date: 01/03/2002    Years since quitting: 19.2   Smokeless tobacco: Never   Tobacco comments:    teenager  Vaping Use   Vaping Use: Never used  Substance and Sexual Activity   Alcohol use: No   Drug use: No   Sexual activity: Not on file  Other Topics Concern   Not on file  Social History Narrative   Not on file   Social Determinants of Health   Financial Resource Strain: Not on file  Food Insecurity: Not on file  Transportation Needs: Not on file  Physical Activity: Not on file  Stress: Not on file  Social Connections: Not on file   Family History  Problem Relation Age of Onset   Breast cancer Other    Heart attack Mother    Heart attack Father    Scheduled Meds:  acetaminophen  500 mg Oral Q8H   diclofenac  Sodium  2 g Topical QID   enoxaparin (LOVENOX) injection  30 mg Subcutaneous Q24H   ferrous sulfate  325 mg Oral Q breakfast   fluticasone furoate-vilanterol  1 puff Inhalation Daily   And   umeclidinium bromide  1 puff Inhalation Daily   guaiFENesin  600 mg Oral BID   hydrALAZINE  25 mg Oral Q8H   hydrochlorothiazide  12.5 mg Oral Daily   insulin aspart  0-9 Units Subcutaneous Q4H   latanoprost  1 drop Right Eye QHS   linagliptin  5 mg Oral Daily   mouth rinse  15 mL Mouth Rinse BID   metoprolol succinate  50 mg Oral Daily   mirtazapine  7.5 mg Oral QHS   multivitamin with minerals  1 tablet Oral  Daily   pantoprazole  40 mg Oral Daily   penicillin g benzathine (BICILLIN-LA) IM  2.4 Million Units Intramuscular Weekly   rosuvastatin  20 mg Oral QHS   verapamil  240 mg Oral QHS   Continuous Infusions:  lactated ringers 75 mL/hr at 04/14/21 1204   PRN Meds:.albuterol, guaiFENesin-dextromethorphan, hydrALAZINE Medications Prior to Admission:  Prior to Admission medications   Medication Sig Start Date End Date Taking? Authorizing Provider  amLODipine (NORVASC) 5 MG tablet Take 5 mg by mouth daily.   Yes [provider]  ferrous sulfate 325 (65 FE) MG tablet Take 325 mg by mouth daily with breakfast. 01/23/19  Yes [provider]  gabapentin (NEURONTIN) 100 MG capsule Take 100 mg by mouth 2 (two) times a day. 02/08/19  Yes [provider]  hydrochlorothiazide (MICROZIDE) 12.5 MG capsule TAKE 1 CAPSULE BY MOUTH EVERY DAY Patient taking differently: Take 12.5 mg by mouth daily. 12/17/20  Yes Patwardhan, Manish J, MD  JANUVIA 100 MG tablet Take 100 mg by mouth daily.  02/20/19  Yes [provider]  latanoprost (XALATAN) 0.005 % ophthalmic solution Place 1 drop into the right eye at bedtime. 02/19/21  Yes [provider]  metoprolol succinate (TOPROL-XL) 50 MG 24 hr tablet TAKE 1 TABLET (50 MG TOTAL) BY MOUTH DAILY. TAKE WITH OR IMMEDIATELY FOLLOWING A  MEAL. Patient taking differently: Take 50 mg by mouth daily. 10/15/20 01/13/21 Yes Patwardhan, Manish J, MD  mirtazapine (REMERON) 7.5 MG tablet Take 7.5 mg by mouth at bedtime.  01/16/18  Yes [provider]  OXYGEN Inhale 3 L into the lungs continuous.   Yes [provider]  pantoprazole (PROTONIX) 40 MG tablet Take 40 mg by mouth daily.   Yes [provider]  rosuvastatin (CRESTOR) 20 MG tablet Take 20 mg by mouth at bedtime. 07/20/20  Yes [provider]  SYMBICORT 160-4.5 MCG/ACT inhaler Inhale 2 puffs into the lungs 2 (two) times daily.  01/30/18  Yes [provider]  verapamil (CALAN-SR) 240 MG CR tablet TAKE 1 TABLET BY MOUTH AT BEDTIME. Patient taking differently: Take 240 mg by mouth at bedtime. 01/22/21  Yes Patwardhan, Anabel BeneManish J, MD  Vitamin D, Ergocalciferol, (DRISDOL) 1.25 MG (50000 UT) CAPS capsule Take 50,000 Units by mouth every Monday. 02/13/19  Yes [provider]  BREZTRI AEROSPHERE 160-9-4.8 MCG/ACT AERO Inhale 1 puff into the lungs at bedtime. 04/04/21   Almon HerculesGonfa, Taye T, MD  COMBIVENT RESPIMAT 20-100 MCG/ACT AERS respimat Inhale 1 puff into the lungs every 6 (six) hours as needed for wheezing or shortness of breath. 04/04/21   Almon HerculesGonfa, Taye T, MD  diclofenac Sodium (VOLTAREN) 1 % GEL Apply 4 g topically 4 (four) times daily. Patient not taking: Reported on 03/31/2021 10/28/20   Melene PlanFloyd, Dan, DO  penicillin g benzathine (BICILLIN LA) 1200000 UNIT/2ML SUSY injection Inject 4 mLs (2.4 Million Units total) into the muscle once a week for 2 doses. 04/12/21 04/20/21  Sheri ClossPahwani, Ravi, MD  Tiotropium Bromide Monohydrate (SPIRIVA RESPIMAT) 1.25 MCG/ACT AERS Inhale 2 puffs into the lungs daily. Patient not taking: Reported on 03/31/2021 02/06/18   Oretha MilchAlva, Rakesh V, MD   Allergies  Allergen Reactions   Arlice ColtMustard [Allyl Isothiocyanate] Hives   Review of Systems  All other systems reviewed and are negative.  Physical Exam Vitals and nursing note reviewed.   Constitutional:      General: She is not in acute distress. Cardiovascular:     Rate and Rhythm: Normal rate.  Pulmonary:  Effort: Pulmonary effort is normal.  Neurological:     Mental Status: She is alert. Mental status is at baseline.  Psychiatric:        Mood and Affect: Mood normal.    Vital Signs: BP (!) 135/54 (BP Location: Left Arm)   Pulse 67   Temp 98.1 F (36.7 C) (Oral)   Resp 20   Ht 5\' 2"  (1.575 m)   Wt 60.4 kg   SpO2 100%   BMI 24.35 kg/m  Pain Scale: 0-10   Pain Score: 0-No pain   SpO2: SpO2: 100 % O2 Device:SpO2: 100 % O2 Flow Rate: .O2 Flow Rate (L/min): 1 L/min  IO: Intake/output summary:  Intake/Output Summary (Last 24 hours) at 04/14/2021 1359 Last data filed at 04/14/2021 1029 Gross per 24 hour  Intake 2509.47 ml  Output 1050 ml  Net 1459.47 ml    LBM: Last BM Date: 04/12/21 Baseline Weight: Weight: 64.4 kg Most recent weight: Weight: 60.4 kg     Palliative Assessment/Data: 40%     Time In: 12:45pm  Time Out: 1:45pm Time Total: 60 minutes Greater than 50% of this time was spent in counseling and coordinating care related to the above assessment and plan.  06/12/21, PA-C Palliative Medicine Team Team phone # [phone]  Thank you for allowing the Palliative Medicine Team to assist in the care of this patient. Please utilize secure chat with additional questions, if there is no response within 30 minutes please call the above phone number.  Palliative Medicine Team providers are available by phone from 7am to 7pm daily and can be reached through the team cell phone.  Should this patient require assistance outside of these hours, please call the patient's attending physician.

## 2021-04-14 NOTE — Progress Notes (Signed)
Physical Therapy Treatment Patient Details Name: Sheri [name] MRN: [id number] DOB: 19-Jan-1926 Today's Date: 04/14/2021    History of Present Illness Pt is a 87 year old woman admitted on 03/31/21 with AMS, COPD exacerbation  and falls. Pt injured her L ankle/foot and hit her head in a fall. Head CT negative, MRI pending. PMH: COPD on 3L at home, T11 compression fx, HTN, AAA.    PT Comments    Pt received in chair, agreeable to therapy session with encouragement and with good participation and fair tolerance for exercise and transfer training. Pt quick to fatigue with standing tasks and with notable increased work of breathing/increased anxiety when stepping toward [facility] so deferred longer gait trial as pt needing +1-2 modA to step to/from [facility]. Pt performed seated/standing exercises with good tolerance, needs multimodal cues and difficulty reaching terminal knee extension due to BLE weakness, able to achieve with active assist. Pt continues to benefit from PT services to progress toward functional mobility goals. Continue to recommend SNF.   Follow Up Recommendations  SNF;Supervision/Assistance - 24 hour     Equipment Recommendations  Other (comment);None recommended by PT (may consider pressure offloading cushion for chair)    Recommendations for Other Services       Precautions / Restrictions Precautions Precautions: Fall Precaution Comments: frequent falls at home (2-3x week); modified diet Restrictions Weight Bearing Restrictions: No    Mobility  Bed Mobility               General bed mobility comments: pt received in chair did not want to get back to bed.    Transfers Overall transfer level: Needs assistance Equipment used: Rolling walker (2 wheeled) Transfers: Sit to/from [employer] Sit to Stand: Min assist;Mod assist Stand pivot transfers: Mod assist;+2 safety/equipment       General transfer comment: Min to modA overall for sit to stand  transfers, cues for hand placement. Pt at times having difficulty following cues for hand placement and needs increased assist to reach upright posture (likely due to fatigue after working with OT in AM). Pt needs manual assist to manage RW while turning at times.  Ambulation/Gait Ambulation/Gait assistance: Mod assist;+2 safety/equipment Gait Distance (Feet): 4 Feet Assistive device: Rolling walker (2 wheeled) Gait Pattern/deviations: Step-to pattern;Decreased step length - right;Decreased step length - left;Trunk flexed Gait velocity: slowed   General Gait Details: Distance limited due to pt fatigue and drowsiness, agreeable to walk to [facility] and back to bed (36ft total), pt impulsive to sit prior to reaching chair and unable to safely progress gait further 2/2 cognition/fatigue.   Stairs             Wheelchair Mobility    Modified Rankin (Stroke Patients Only)       Balance Overall balance assessment: Needs assistance Sitting-balance support: No upper extremity supported;Feet supported Sitting balance-Leahy Scale: Fair     Standing balance support: Bilateral upper extremity supported Standing balance-Leahy Scale: Poor Standing balance comment: reliant on UE support in standing and noted retropulsion.                            Cognition Arousal/Alertness: Awake/alert Behavior During Therapy: Flat affect Overall Cognitive Status: Impaired/Different from baseline Area of Impairment: Memory;Following commands;Safety/judgement;Awareness;Problem solving;Orientation;Attention                 Orientation Level: Disoriented to;Time;Situation Current Attention Level: Sustained Memory: Decreased short-term memory Following Commands: Follows one step commands  with increased time Safety/Judgement: Decreased awareness of safety;Decreased awareness of deficits Awareness: Emergent Problem Solving: Slow processing;Decreased initiation;Difficulty sequencing;Requires  verbal cues;Requires tactile cues General Comments: Pt with increased fatigue/flat affect in afternoon (had worked with OT and SLP in AM). Increased time and repetition needed to follow instructions. Continued poor safety awreness and difficulty following directions in using RW.      Exercises General Exercises - Lower Extremity Ankle Circles/Pumps: AROM;Both;10 reps;Seated;AAROM (tactile cues for technique) Long Arc Quad: AROM;Both;10 reps;Seated;AAROM (tcs for technique) Hip Flexion/Marching: AROM;Both;10 reps;Seated (visual cues for technique) Other Exercises Other Exercises: STS x3 reps    General Comments General comments (skin integrity, edema, etc.): Pt SpO2 96-100% on RA at rest and with exertion, increased WOB noted and pt requesting inhaler, RN notified. HR 66-74 bpm with exertion.      Pertinent Vitals/Pain Pain Assessment: Faces Faces Pain Scale: Hurts a little bit Pain Location: pt c/o L hip pain initially but then pointing to R hip later in session (may be from sitting in chair, repositioned with pillows for comfort) Pain Descriptors / Indicators: Grimacing;Guarding;Discomfort    Home Living                      Prior Function            PT Goals (current goals can now be found in the care plan section) Acute Rehab PT Goals Patient Stated Goal: pt unable to state, daughter wants her to do therapy and get stronger so she can get back home soon. PT Goal Formulation: Patient unable to participate in goal setting Time For Goal Achievement: 04/15/21 Progress towards PT goals: Progressing toward goals    Frequency    Min 2X/week      PT Plan Current plan remains appropriate    Co-evaluation              AM-PAC PT "6 Clicks" Mobility   Outcome Measure  Help needed turning from your back to your side while in a flat bed without using bedrails?: A Little Help needed moving from lying on your back to sitting on the side of a flat bed without using  bedrails?: A Little Help needed moving to and from a bed to a chair (including a wheelchair)?: A Lot Help needed standing up from a chair using your arms (e.g., wheelchair or bedside chair)?: A Lot Help needed to walk in hospital room?: A Lot Help needed climbing 3-5 steps with a railing? : Total 6 Click Score: 13    End of Session Equipment Utilized During Treatment: Gait belt Activity Tolerance: Patient tolerated treatment well;Patient limited by fatigue Patient left: in chair;with call bell/phone within reach;with chair alarm set;with family/visitor present (family x2 present) Nurse Communication: Mobility status;Other (comment) (pt wanting inhaler but SpO2 WNL; could benefit from geomat cushion for chair) PT Visit Diagnosis: Unsteadiness on feet (R26.81);Other abnormalities of gait and mobility (R26.89);Repeated falls (R29.6);Muscle weakness (generalized) (M62.81);Difficulty in walking, not elsewhere classified (R26.2);History of falling (Z91.81);Pain Pain - part of body:  (bottom (pointing to both sides during session))     Time: 1456-1519 PT Time Calculation (min) (ACUTE ONLY): 23 min  Charges:  $Therapeutic Exercise: 8-22 mins $Therapeutic Activity: 8-22 mins                     Sheri Link P., PTA Acute Rehabilitation Services Pager: [phone] Office: [phone]    Sheri [name] 04/14/2021, 4:00 PM

## 2021-04-14 NOTE — Progress Notes (Signed)
Pt wore bipap for approx 1hr and 30 minutes. She requested it off when awoken for morning labs. She refuses it back on at this time. Will continue to monitor.

## 2021-04-14 NOTE — Progress Notes (Signed)
Modified Barium Swallow Progress Note  Patient Details  Name: Sheri [name] MRN: [id number] Date of Birth: 1926-02-28  Today's Date: 04/14/2021  Late entry for MBSS completed by Jeani Sow, MA, CCC-SLP on 04/08/21.    Modified Barium Swallow completed.  Full report located under Chart Review in the Imaging Section.  Brief recommendations include the following:  Clinical Impression   Pt presents with mild oral, mild to moderate pharyngeal, and a component of esphageal dysphagia. Pt has dentures at baseline but did not utilize this date, stating need for denture adhesive and that daughter is to bring in. Pt with reduced bolus cohesion with solids, delayed oral transit with POs, and mild oral residuals. Pt with delay in swallow initiation with all liquids at the level of the pyriform sinsus, reduced laryngeal vestibule closure with incomplete epiglottic deflection, decreased pharyngeal constriction, diminished sensation, and reduced UES distention. Cervical osteophytes noted that likely also impacted cervical esophageal patency. Pt with laryngeal penetration of thin (PAS-2,3), nectar thick (PAS-3,5) and laryngeal penetration with post swallow silent aspiration with honey thick liquids (3,5,8). Pt unable to implement cued cough despite SLP instruction due to cognitive deficits. Pt with barium tablet stasis, brief in nature in valleculae, then cricopharyngeal region (reflexive swallow did propel tablet to esophagus). During esopahgeal sweep pt with barium retention and tablet slowly traversed to the level of GE junction. Pt with hx of presbyesophagus and mild stricture per prior workup. Recommend dysphagia 3 (mechanical soft) and thin liquids with meds crushed in puree. SLP to follow up.   Swallow Evaluation Recommendations SLP Diet Recommendations Dysphagia 3 (Mech soft) solids;Thin liquid  Liquid Administration via Cup;Straw  Medication Administration Crushed with puree  Compensations Minimize  environmental distractions;Slow rate;Small sips/bites;Clear throat intermittently  Postural Changes Remain semi-upright after after feeds/meals (Comment);Seated upright at 90 degrees                                         Kamry Faraci E Chay Mazzoni 04/14/2021,9:42 AM

## 2021-04-14 NOTE — Progress Notes (Signed)
  Speech Language Pathology Treatment: Dysphagia  Patient Details Name: Sheri [name] MRN: [id number] DOB: Sep 03, 1926 Today's Date: 04/14/2021 Time: 5366-4403 SLP Time Calculation (min) (ACUTE ONLY): 18 min  Assessment / Plan / Recommendation Clinical Impression  Pt  tolerated soft solids and thin liquids with no clinical s/s of aspiration.  Reviewed swallow strategies with pt and daughters and provided written sign posted at head of bad.  Pt participated in trial swallow therapy and demonstrated ability to complete exercises as noted below.  Provided written handout for continuing exercise independently/with family.  SWALLOWING EXERCISE  Effortful swallow No of Reps: 5 Effort: good Accuracy: good  CTAR No of reps: 5 Effort: fair-good Accuracy: good  Mendelssohn No of reps: 3 Effort: Good Accuracy: Poor  Isometric breath hold No of reps: 5 Effort: Good Accuracy: Fair    HPI HPI: Pt is a 87 year old woman admitted on 03/31/21 with AMS, COPD exacerbation  and falls. Pt injured her L ankle/foot and hit her head in a fall. Head CT negative, CXR on admission negative. Esophageal dysphagia reported by pt's daughter to referring MD. Esophagram 08/06/19: Esophageal dysmotility, likely presbyesophagus. Small hiatal hernia with mild stricture as evidenced by underdistention at the gastroesophageal junction. PMH: COPD on 3L at home, t11 compression fx, HTN, AAA.      SLP Plan  Continue with current plan of care       Recommendations  Diet recommendations: Dysphagia 3 (mechanical soft);Thin liquid Liquids provided via: Cup;Straw Medication Administration: Crushed with puree Supervision: Staff to assist with self feeding;Full supervision/cueing for compensatory strategies Compensations: Minimize environmental distractions;Slow rate;Small sips/bites;Clear throat intermittently Postural Changes and/or Swallow Maneuvers: Seated upright 90 degrees;Upright 30-60 min after meal                 Oral Care Recommendations: Oral care BID Follow up Recommendations: 24 hour supervision/assistance;Skilled Nursing facility SLP Visit Diagnosis: Dysphagia, oropharyngeal phase (R13.12);Dysphagia, pharyngoesophageal phase (R13.14) Plan: Continue with current plan of care       GO                Kerrie Pleasure, MA, CCC-SLP Acute Rehabilitation Services Office: [phone]  04/14/2021, 1:10 PM

## 2021-04-15 LAB — BLOOD GAS, ARTERIAL
Acid-Base Excess: 3.9 mmol/L — ABNORMAL HIGH (ref 0.0–2.0)
Bicarbonate: 28.2 mmol/L — ABNORMAL HIGH (ref 20.0–28.0)
Drawn by: 404151
FIO2: 24
O2 Saturation: 96.7 %
Patient temperature: 37
pCO2 arterial: 44.8 mmHg (ref 32.0–48.0)
pH, Arterial: 7.415 (ref 7.350–7.450)
pO2, Arterial: 90.8 mmHg (ref 83.0–108.0)

## 2021-04-15 LAB — GLUCOSE, CAPILLARY
Glucose-Capillary: 106 mg/dL — ABNORMAL HIGH (ref 70–99)
Glucose-Capillary: 107 mg/dL — ABNORMAL HIGH (ref 70–99)
Glucose-Capillary: 107 mg/dL — ABNORMAL HIGH (ref 70–99)
Glucose-Capillary: 116 mg/dL — ABNORMAL HIGH (ref 70–99)
Glucose-Capillary: 127 mg/dL — ABNORMAL HIGH (ref 70–99)
Glucose-Capillary: 98 mg/dL (ref 70–99)
Glucose-Capillary: 99 mg/dL (ref 70–99)

## 2021-04-15 MED ORDER — HALOPERIDOL LACTATE 5 MG/ML IJ SOLN
INTRAMUSCULAR | Status: AC
  Filled 2021-04-15: qty 1

## 2021-04-15 MED ORDER — BOOST / RESOURCE BREEZE PO LIQD CUSTOM
1.0000 | ORAL | Status: DC
  Administered 2021-04-15 – 2021-04-19 (×4): 1 via ORAL

## 2021-04-15 MED ORDER — ENSURE ENLIVE PO LIQD
237.0000 mL | Freq: Two times a day (BID) | ORAL | Status: DC
  Administered 2021-04-15 – 2021-04-21 (×8): 237 mL via ORAL

## 2021-04-15 MED ORDER — HALOPERIDOL LACTATE 5 MG/ML IJ SOLN
2.0000 mg | Freq: Once | INTRAMUSCULAR | Status: AC | PRN
  Administered 2021-04-15: 2 mg via INTRAMUSCULAR

## 2021-04-15 NOTE — Progress Notes (Signed)
Nutrition Follow-up RD working remotely.   DOCUMENTATION CODES:   Not applicable  INTERVENTION:  - will order Ensure Enlive BID, each supplement provides 350 kcal and 20 grams of protein. - will order Boost Breeze once/day, each supplement provides 250 kcal and 9 grams of protein. - continue Magic Cup TID with meals, each supplement provides 290 kcal and 9 grams of protein. - floor staff to assist with feeding when family is not present.    NUTRITION DIAGNOSIS:   Increased nutrient needs related to chronic illness (COPD) as evidenced by estimated needs. -ongoing  GOAL:   Patient will meet greater than or equal to 90% of their needs -unmet  MONITOR:   PO intake, Supplement acceptance, Labs   ASSESSMENT:   87 yo female admitted with AMS, COPD exacerbation. PMH includes COPD on 3 L oxygen at home, HTN.  RD is working on another campus. She is noted to be a/o to self only. Notes have indicated that patient requires feeding assistance.  Most recently documented meal intakes were 50% of dinner on 6/11; 60% of breakfast, 40% of lunch, and 35% of dinner on 6/13; 25% of breakfast on 6/14.   Ensure Enlive was ordered TID on 6/1 and stopped on 6/8. Will re-trial d/t ongoing inadequate intakes although intakes have improved for patient who was previously consuming 0-25% at most meals.  Palliative Care following and last saw patient and talked with daughter yesterday with plan to follow-up today.   She was last weighed on 6/12 and that was lowest weight this hospitalization. Mild pitting edema to BUE documented in the edema section of flow sheet.      Labs reviewed; CBGs: 106, 107, 107 mg/dl, Na: 734 mmol/l, Cl: 94 mmol/l, creatinine: 1.06 mg/dl, Ca: 8.5 mg/dl, GFR: 49 ml/min. Medications reviewed; 325 mg ferrous sulfate/day, sliding scale novolog, 5 mg tradjenta/day, 1 tablet multivitamin with minerals/day, 40 mg oral protonix/day. IVF; LR @ 75 ml/hr.     Diet Order:   Diet  Order             DIET DYS 3 Room service appropriate? No; Fluid consistency: Thin  Diet effective now           Diet general                   EDUCATION NEEDS:   No education needs have been identified at this time  Skin:  Skin Assessment: Reviewed RN Assessment  Last BM:  6/12?  Height:   Ht Readings from Last 1 Encounters:  04/03/21 5\' 2"  (1.575 m)    Weight:   Wt Readings from Last 1 Encounters:  04/12/21 60.4 kg      Estimated Nutritional Needs:  Kcal:  1550-1750 Protein:  80-90 gm Fluid:  >/= 1.7 L      06/12/21, MS, RD, LDN, CNSC Inpatient Clinical Dietitian RD pager # available in AMION  After hours/weekend pager # available in [facility]

## 2021-04-15 NOTE — Progress Notes (Signed)
[facility] PROGRESS NOTE    [name]  KYH:[id number] DOB: 1926-08-24 DOA: 03/30/2021 PCP: Renaye Rakers, MD      Brief Narrative:  Sheri [name] is a 87 y.o. F with CODP, HTN who presented with confusion.  Per pulmonary consult: "87 year old female with prior history of former smoker (quit 19 yrs ago), COPD, chronic hypoxic respiratory failure on 3L home O2, dysphagia, DMT2, and HTN admitted to [facility] on 5/31 with acute COPD exacerbation and acute on chronic hypoxic/ hypercarbic respiratory failure with acute metabolic encephalopathy.  Also noted to have a fall 4 days prior to admission with family reporting recurrent falls over the last 4 months due to leg giving out."       Assessment & Plan:  Acute metabolic encephalopathy The patient has been improving, has been alert and interactive and starting to stand and walk over the last several days, then last night she was sundowning, was given 2 mg IM Haldol and subsequently was sleepy.  Then today, thoughout the day, she has been nearly very sluggish and sedated.   No focal neurological deficits.  ABG this morning normal.    I suspect this is excessive reaction to Haldol.  No prior diagnosis of Parkinson's -Avoid Haldol    COPD Chronic hypoxic respiratory failure No evidence of flare, ABG overnight normal - Continue ICS/LABA/LAMA  Diabetes Glucoses controlled - Continue sliding scale corrections - Continue linagliptin  Hypertension Blood pressure controlled - Continue hydralazine, HCTZ, metoprolol, verapamil  Latent syphilis - Continue penicillin  Hyperlipidemia - Continue Crestor  Dementia - Continue mirtazapine   Hyponatremia  Anemia  CKD IIIa       Disposition: Status is: Inpatient  Remains inpatient appropriate because:Altered mental status  Dispo: The patient is from: Home              Anticipated d/c is to: SNF              Patient currently is not medically stable to  d/c.   Difficult to place patient No       Level of care: Med-Surg       MDM: The below labs and imaging reports were reviewed and summarized above.  Medication management as above.     DVT prophylaxis: enoxaparin (LOVENOX) injection 30 mg Start: 04/08/21 1430  Code Status: FULL Family Communication: daughter at the bedside         Subjective: Fever, no headache, no chest pain, no seizures, no respiratory distress.  No wheezing.  Objective: Vitals:   04/14/21 2352 04/15/21 0340 04/15/21 0902 04/15/21 1210  BP: 106/82 133/82 (!) 166/65 (!) 153/60  Pulse: 60 65 79 62  Resp: 18 18 20 17   Temp: 98.1 F (36.7 C) 98.4 F (36.9 C) 98.1 F (36.7 C) 98.2 F (36.8 C)  TempSrc: Oral Oral Oral Oral  SpO2: 96% 95% 100% 100%  Weight:      Height:        Intake/Output Summary (Last 24 hours) at 04/15/2021 1811 Last data filed at 04/15/2021 1100 Gross per 24 hour  Intake 120 ml  Output --  Net 120 ml   Filed Weights   04/03/21 0915 04/07/21 0527 04/12/21 0436  Weight: 64.4 kg 63.1 kg 60.4 kg    Examination: General appearance: Elderly adult female, very sluggish and somnolent, appears drug HEENT: Anicteric, conjunctiva pink, lids and lashes normal. No nasal deformity, discharge, epistaxis.  Lips moist dentures in place, oropharynx very dry, no oral lesions,  hearing normal.   Skin: Warm and dry.  no jaundice.  No suspicious rashes or lesions. Cardiac: RRR, nl S1-S2, no murmurs appreciated.  Capillary refill is brisk.  JVP normal.  No LE edema.  Radial  pulses 2+ and symmetric. Respiratory: Normal respiratory rate and rhythm.  CTAB without rales or wheezes. Abdomen: Abdomen soft.  No TTP or guarding. No ascites, distension, hepatosplenomegaly.   MSK: No deformities or effusions. Neuro: Sleeping, arouses to vigorous touch, but very sluggish.  Strength in upper extremities symmetric, too wewak to lift legs either of them against gravity, face symmetric, does not  follow commands well enough to test EOM or cerebellar signs speech dysarthric due to dry mouth.    Psych: Oriented to name, "Sheri [name]" but not to month, and very sluggish and confused.      Data Reviewed: I have personally reviewed following labs and imaging studies:  CBC: Recent Labs  Lab 04/09/21 0131 04/10/21 0342 04/11/21 0155 04/12/21 0046 04/13/21 0350  WBC 9.6 9.5 10.7* 8.8 8.2  NEUTROABS 5.8 5.9 6.7 5.7 5.3  HGB 10.2* 10.3* 10.3* 9.2* 9.1*  HCT 31.6* 32.7* 32.0* 29.1* 28.8*  MCV 85.2 86.7 85.8 85.1 86.2  PLT 413* 438* 453* 399 349   Basic Metabolic Panel: Recent Labs  Lab 04/09/21 0131 04/10/21 0342 04/12/21 0046 04/14/21 0149  NA 130* 129* 132* 131*  K 3.8 3.6 3.2* 3.6  CL 88* 92* 94* 94*  CO2 32 27 30 29   GLUCOSE 127* 118* 82 94  BUN 22 17 14 9   CREATININE 1.37* 1.22* 1.02* 1.06*  CALCIUM 9.1 9.0 8.6* 8.5*   GFR: Estimated Creatinine Clearance: 29 mL/min (A) (by C-G formula based on SCr of 1.06 mg/dL (H)). Liver Function Tests: Recent Labs  Lab 04/09/21 0131  AST 26  ALT 16  ALKPHOS 48  BILITOT 0.5  PROT 6.3*  ALBUMIN 2.7*   No results for input(s): LIPASE, AMYLASE in the last 168 hours. No results for input(s): AMMONIA in the last 168 hours. Coagulation Profile: No results for input(s): INR, PROTIME in the last 168 hours. Cardiac Enzymes: No results for input(s): CKTOTAL, CKMB, CKMBINDEX, TROPONINI in the last 168 hours. BNP (last 3 results) No results for input(s): PROBNP in the last 8760 hours. HbA1C: No results for input(s): HGBA1C in the last 72 hours. CBG: Recent Labs  Lab 04/15/21 0047 04/15/21 0438 04/15/21 1036 04/15/21 1207 04/15/21 1749  GLUCAP 106* 107* 107* 99 98   Lipid Profile: No results for input(s): CHOL, HDL, LDLCALC, TRIG, CHOLHDL, LDLDIRECT in the last 72 hours. Thyroid Function Tests: No results for input(s): TSH, T4TOTAL, FREET4, T3FREE, THYROIDAB in the last 72 hours. Anemia Panel: No results for input(s):  VITAMINB12, FOLATE, FERRITIN, TIBC, IRON, RETICCTPCT in the last 72 hours. Urine analysis:    Component Value Date/Time   COLORURINE YELLOW 03/30/2021 2350   APPEARANCEUR CLEAR 03/30/2021 2350   LABSPEC 1.016 03/30/2021 2350   PHURINE 5.0 03/30/2021 2350   GLUCOSEU NEGATIVE 03/30/2021 2350   HGBUR SMALL (A) 03/30/2021 2350   BILIRUBINUR NEGATIVE 03/30/2021 2350   KETONESUR NEGATIVE 03/30/2021 2350   PROTEINUR NEGATIVE 03/30/2021 2350   NITRITE NEGATIVE 03/30/2021 2350   LEUKOCYTESUR NEGATIVE 03/30/2021 2350   Sepsis Labs: @LABRCNTIP (procalcitonin:4,lacticacidven:4)  ) Recent Results (from the past 240 hour(s))  Resp Panel by RT-PCR (Flu A&B, Covid) Nasopharyngeal Swab     Status: None   Collection Time: 04/06/21  1:26 PM   Specimen: Nasopharyngeal Swab; Nasopharyngeal(NP) swabs in vial transport medium  Result Value Ref Range Status   SARS Coronavirus 2 by RT PCR NEGATIVE NEGATIVE Final    Comment: (NOTE) SARS-CoV-2 target nucleic acids are NOT DETECTED.  The SARS-CoV-2 RNA is generally detectable in upper respiratory specimens during the acute phase of infection. The lowest concentration of SARS-CoV-2 viral copies this assay can detect is 138 copies/mL. A negative result does not preclude SARS-Cov-2 infection and should not be used as the sole basis for treatment or other patient management decisions. A negative result may occur with  improper specimen collection/handling, submission of specimen other than nasopharyngeal swab, presence of viral mutation(s) within the areas targeted by this assay, and inadequate number of viral copies(<138 copies/mL). A negative result must be combined with clinical observations, patient history, and epidemiological information. The expected result is Negative.  Fact Sheet for Patients:  BloggerCourse.com  Fact Sheet for Healthcare Providers:  SeriousBroker.it  This test is no t yet  approved or cleared by the [place] FDA and  has been authorized for detection and/or diagnosis of SARS-CoV-2 by FDA under an Emergency Use Authorization (EUA). This EUA will remain  in effect (meaning this test can be used) for the duration of the COVID-19 declaration under Section 564(b)(1) of the Act, 21 U.S.C.section 360bbb-3(b)(1), unless the authorization is terminated  or revoked sooner.       Influenza A by PCR NEGATIVE NEGATIVE Final   Influenza B by PCR NEGATIVE NEGATIVE Final    Comment: (NOTE) The Xpert Xpress SARS-CoV-2/FLU/RSV plus assay is intended as an aid in the diagnosis of influenza from Nasopharyngeal swab specimens and should not be used as a sole basis for treatment. Nasal washings and aspirates are unacceptable for Xpert Xpress SARS-CoV-2/FLU/RSV testing.  Fact Sheet for Patients: BloggerCourse.com  Fact Sheet for Healthcare Providers: SeriousBroker.it  This test is not yet approved or cleared by the [place] FDA and has been authorized for detection and/or diagnosis of SARS-CoV-2 by FDA under an Emergency Use Authorization (EUA). This EUA will remain in effect (meaning this test can be used) for the duration of the COVID-19 declaration under Section 564(b)(1) of the Act, 21 U.S.C. section 360bbb-3(b)(1), unless the authorization is terminated or revoked.  Performed at [name], [address], [place], [place] [address]          Radiology Studies: No results found.      Scheduled Meds:  acetaminophen  500 mg Oral Q8H   diclofenac Sodium  2 g Topical QID   enoxaparin (LOVENOX) injection  30 mg Subcutaneous Q24H   feeding supplement  1 Container Oral Q24H   feeding supplement  237 mL Oral BID BM   ferrous sulfate  325 mg Oral Q breakfast   fluticasone furoate-vilanterol  1 puff Inhalation Daily   And   umeclidinium bromide  1 puff Inhalation Daily   guaiFENesin  600  mg Oral BID   hydrALAZINE  25 mg Oral Q8H   hydrochlorothiazide  12.5 mg Oral Daily   insulin aspart  0-9 Units Subcutaneous Q4H   latanoprost  1 drop Right Eye QHS   linagliptin  5 mg Oral Daily   mouth rinse  15 mL Mouth Rinse BID   metoprolol succinate  50 mg Oral Daily   mirtazapine  7.5 mg Oral QHS   multivitamin with minerals  1 tablet Oral Daily   pantoprazole  40 mg Oral Daily   penicillin g benzathine (BICILLIN-LA) IM  2.4 Million Units Intramuscular Weekly   rosuvastatin  20  mg Oral QHS   verapamil  240 mg Oral QHS   Continuous Infusions:   LOS: 15 days    Time spent: 35 minutes    Alberteen Samhristopher P Cornell Bourbon, MD [facility] 04/15/2021, 6:11 PM     Please page though AMION or Epic secure chat:  For [employer] Holdings Corporation[employer]ion password, Higher education careers advisercontact charge nurse

## 2021-04-15 NOTE — Progress Notes (Signed)
Pt combative at this time, will check back later to try and administer daily inhaler medications.

## 2021-04-15 NOTE — Consult Note (Signed)
   [facility] CM Inpatient Consult   04/15/2021  SHONTAVIA MICKEL 04-02-1926 [id number]  [facility] [[facility]]  Accountable Care Organization [ACO] Patient: [facility]   Patient screened for length of stay hospitalization with noted high risk score for unplanned readmission risk and  to assess for potential [facility]  [[facility]] Care Management service needs for post hospital transition.  Review of patient's medical record reveals patient is awaiting potential skilled nursing facility stay verses home with home health.  Spoke with inpatient TOC regarding barriers for pulmonary status needs.  Plan:  Continue to follow progress and disposition to assess for post hospital care management needs. If patient returns home will plan to follow up with offer for [facility] Care Management follow up.  For questions contact:   Charlesetta Shanks, RN BSN CCM [facility]  [phone] business mobile phone Toll free office [phone]  Fax number: [phone] [email or web address]

## 2021-04-15 NOTE — Progress Notes (Signed)
On-call physician notified during the 1 am hour due to patient's increased agitation with climbing out of bed, yelling out and pulling lines. Orders given. Will continue to monitor.

## 2021-04-16 ENCOUNTER — Inpatient Hospital Stay (HOSPITAL_COMMUNITY): Payer: Medicare Other

## 2021-04-16 DIAGNOSIS — J441 Chronic obstructive pulmonary disease with (acute) exacerbation: Secondary | ICD-10-CM | POA: Diagnosis not present

## 2021-04-16 DIAGNOSIS — Z7189 Other specified counseling: Secondary | ICD-10-CM | POA: Diagnosis not present

## 2021-04-16 DIAGNOSIS — G9341 Metabolic encephalopathy: Secondary | ICD-10-CM | POA: Diagnosis not present

## 2021-04-16 DIAGNOSIS — J9622 Acute and chronic respiratory failure with hypercapnia: Secondary | ICD-10-CM | POA: Diagnosis not present

## 2021-04-16 DIAGNOSIS — R1319 Other dysphagia: Secondary | ICD-10-CM

## 2021-04-16 LAB — BASIC METABOLIC PANEL
Anion gap: 7 (ref 5–15)
BUN: 6 mg/dL — ABNORMAL LOW (ref 8–23)
CO2: 30 mmol/L (ref 22–32)
Calcium: 8.6 mg/dL — ABNORMAL LOW (ref 8.9–10.3)
Chloride: 93 mmol/L — ABNORMAL LOW (ref 98–111)
Creatinine, Ser: 0.93 mg/dL (ref 0.44–1.00)
GFR, Estimated: 58 mL/min — ABNORMAL LOW (ref >60)
Glucose, Bld: 108 mg/dL — ABNORMAL HIGH (ref 70–99)
Potassium: 3.2 mmol/L — ABNORMAL LOW (ref 3.5–5.1)
Sodium: 130 mmol/L — ABNORMAL LOW (ref 135–145)

## 2021-04-16 LAB — BLOOD GAS, ARTERIAL
Acid-Base Excess: 5.4 mmol/L — ABNORMAL HIGH (ref 0.0–2.0)
Bicarbonate: 30.9 mmol/L — ABNORMAL HIGH (ref 20.0–28.0)
Drawn by: 24610
FIO2: 28
O2 Saturation: 93 %
Patient temperature: 36.5
pCO2 arterial: 58 mmHg — ABNORMAL HIGH (ref 32.0–48.0)
pH, Arterial: 7.343 — ABNORMAL LOW (ref 7.350–7.450)
pO2, Arterial: 68.7 mmHg — ABNORMAL LOW (ref 83.0–108.0)

## 2021-04-16 LAB — CBC
HCT: 27.5 % — ABNORMAL LOW (ref 36.0–46.0)
Hemoglobin: 8.9 g/dL — ABNORMAL LOW (ref 12.0–15.0)
MCH: 27.6 pg (ref 26.0–34.0)
MCHC: 32.4 g/dL (ref 30.0–36.0)
MCV: 85.4 fL (ref 80.0–100.0)
Platelets: 368 10*3/uL (ref 150–400)
RBC: 3.22 MIL/uL — ABNORMAL LOW (ref 3.87–5.11)
RDW: 15.7 % — ABNORMAL HIGH (ref 11.5–15.5)
WBC: 9.6 10*3/uL (ref 4.0–10.5)
nRBC: 0 % (ref 0.0–0.2)

## 2021-04-16 LAB — GLUCOSE, CAPILLARY
Glucose-Capillary: 100 mg/dL — ABNORMAL HIGH (ref 70–99)
Glucose-Capillary: 106 mg/dL — ABNORMAL HIGH (ref 70–99)
Glucose-Capillary: 120 mg/dL — ABNORMAL HIGH (ref 70–99)
Glucose-Capillary: 124 mg/dL — ABNORMAL HIGH (ref 70–99)
Glucose-Capillary: 84 mg/dL (ref 70–99)
Glucose-Capillary: 90 mg/dL (ref 70–99)

## 2021-04-16 MED ORDER — KCL IN DEXTROSE-NACL 20-5-0.45 MEQ/L-%-% IV SOLN
INTRAVENOUS | Status: AC
  Filled 2021-04-16 (×7): qty 1000

## 2021-04-16 MED ORDER — POTASSIUM CHLORIDE IN NACL 20-0.9 MEQ/L-% IV SOLN: INTRAVENOUS | Status: DC

## 2021-04-16 MED ORDER — KCL IN DEXTROSE-NACL 20-5-0.45 MEQ/L-%-% IV SOLN: INTRAVENOUS | Status: DC

## 2021-04-16 MED ORDER — POTASSIUM CHLORIDE CRYS ER 20 MEQ PO TBCR
20.0000 meq | EXTENDED_RELEASE_TABLET | Freq: Two times a day (BID) | ORAL | Status: DC
  Filled 2021-04-16: qty 1

## 2021-04-16 NOTE — Progress Notes (Addendum)
PT Cancellation Note  Patient Details Name: Sheri [name] MRN: [id number] DOB: 07-24-1926   Cancelled Treatment:    Reason Eval/Treat Not Completed: (P) Patient at procedure or test/unavailable (pt off floor for MRI) Will continue efforts per PT POC as schedule permits.   Dorathy Kinsman Abiola Behring 04/16/2021, 12:55 PM

## 2021-04-16 NOTE — Progress Notes (Signed)
@  2224 Dr. Antionette Char, on-call for attending, text-paged regarding pt's Mucinex and Verapamil (neither can be crushed and pt can't presently swallow whole pills).  Page promptly returned and MD endorsed both medications could be held tonight. Will convey to Day RN at shift report.

## 2021-04-16 NOTE — Progress Notes (Signed)
Discussed w/ Dr Maryfrances Bunnell earlier ABG.  Per MD, wants to try pt on bipap.  Attempted pt on bipap for several minutes, pt started vigorously  shaking head side to side, became very agitated.  I adjusted mask and settings to try to make pt more comfortable, but pt requested to be taken off bipap. I tried to encourage pt to wear, as well as family in room tried to encourage pt to wear bipap, but pt requests off bipap.  Dr Maryfrances Bunnell and RN notifed.  PT placed  back on [place], no distress currently noted on [place].  Sat 97%.

## 2021-04-16 NOTE — Plan of Care (Signed)

## 2021-04-16 NOTE — Progress Notes (Signed)
Came to room for ABG, pt is off the floor.

## 2021-04-16 NOTE — Progress Notes (Signed)
Pt request to try bipap again.  RN in room.  Pt placed back on bipap, currently pt is tolerating and no distress noted.

## 2021-04-16 NOTE — Progress Notes (Signed)
MEWS turned yellow when respiratory charged their vitals assessment after applying BiPap.  At this time rate is 25 and MEWS is green. Will continue to monitor.

## 2021-04-16 NOTE — Progress Notes (Addendum)
Pt back in room from MRI.  O2 not connected, initial spo2 low 80s, 2LO2 via [place] applied and pt quickly back up to 98%.  Coughing up a lot of fluid, assisted with oral suctioning, moderate amount of thin tan fluid.

## 2021-04-16 NOTE — Progress Notes (Signed)
[facility] PROGRESS NOTE    Sheri [name]  OEV:[id number] DOB: 1926-08-03 DOA: 03/30/2021 PCP: Sheri Rakers, MD      Brief Narrative:  Sheri [name] is a 87 y.o. F with CODP, HTN who presented with confusion.  Per pulmonary consult: "87 year old female with prior history of former smoker (quit 19 yrs ago), COPD, chronic hypoxic respiratory failure on 3L home O2, dysphagia, DMT2, and HTN admitted to [facility] on 5/31 with acute COPD exacerbation and acute on chronic hypoxic/ hypercarbic respiratory failure with acute metabolic encephalopathy.  Also noted to have a fall 4 days prior to admission with family reporting recurrent falls over the last 4 months due to leg giving out."       Assessment & Plan:  Acute metabolic encephalopathy In the ED, the patient was confused and VBG showed hypercapnia, started on BiPAP.  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  Since then, patient with waxing and waning encephalopathy.    Most recently, she was improving for the last few days until evening of 6/14 she was sundowning, was given 2 mg IM Haldol and became sedated. Overnight that night, ABG was normal.  Since then, she has been persistently very sluggish and lethargic.  Without fever.  No focal deficits.  MRI repeat normal.  ABG repeat with acidosis again.  There is diagnostic uncertainty about whether this is from Haldol effects, hypercapnia again, or consistent with neurosyphilis.  Given her failure to improve with penicillin and with use of NIV/BiPAP at night, I have consulted Neurology to assist with differential, as she appears to be failing to thrive with all therapies and at this rate may not have neurological recovery.  -Consult Neurology -Consult Palliative Care  -BiPAP at night -Avoid sedatives    COPD Chronic hypoxic respiratory failure No evidence of flare, ABG overnight normal -  Continue ICS/LABA/LAMA  Diabetes Glucoses controlled - Continue sliding scale corrections - Continue linagliptin  Hypertension Blood pressure controlled - Continue hydralazine, HCTZ, metoprolol, verapamil  Latent syphilis - Continue penicillin  Hyperlipidemia - Continue Crestor  Dementia - Continue mirtazapine   Hyponatremia  Anemia  CKD IIIa       Disposition: Status is: Inpatient  Remains inpatient appropriate because:Altered mental status  Dispo: The patient is from: Home              Anticipated d/c is to: SNF              Patient currently is not medically stable to d/c.   Difficult to place patient No       Level of care: Med-Surg       MDM: The below labs and imaging reports were reviewed and summarized above.  Medication management as above.     DVT prophylaxis: enoxaparin (LOVENOX) injection 30 mg Start: 04/08/21 1430  Code Status: FULL Family Communication: daughter at the bedside         Subjective: Fever, no headache, no chest pain, no seizures, no respiratory distress.  No wheezing.  Objective: Vitals:   04/16/21 1302 04/16/21 1626 04/16/21 1749 04/16/21 1836  BP: (!) 153/76 (!) 141/78    Pulse: 78 67 74 65  Resp:  14 (!) 34 (!) 25  Temp: 97.6 F (36.4 C) 97.6 F (36.4 C)    TempSrc: Oral Oral    SpO2: 95% 100% 99% 100%  Weight:      Height:  Intake/Output Summary (Last 24 hours) at 04/16/2021 1928 Last data filed at 04/16/2021 1058 Gross per 24 hour  Intake 120 ml  Output 500 ml  Net -380 ml   Filed Weights   04/03/21 0915 04/07/21 0527 04/12/21 0436  Weight: 64.4 kg 63.1 kg 60.4 kg    Examination: General appearance: Elderly adult female, very sluggish and somnolent, appears drug HEENT: Anicteric, conjunctiva pink, lids and lashes normal. No nasal deformity, discharge, epistaxis.  Lips moist dentures in place, oropharynx very dry, no oral lesions, hearing normal.   Skin: Warm and dry.  no  jaundice.  No suspicious rashes or lesions. Cardiac: RRR, nl S1-S2, no murmurs appreciated.  Capillary refill is brisk.  JVP normal.  No LE edema.  Radial  pulses 2+ and symmetric. Respiratory: Normal respiratory rate and rhythm.  CTAB without rales or wheezes. Abdomen: Abdomen soft.  No TTP or guarding. No ascites, distension, hepatosplenomegaly.   MSK: No deformities or effusions. Neuro: Sleeping, arouses to vigorous touch, but very sluggish.  Strength in upper extremities symmetric, too wewak to lift legs either of them against gravity, face symmetric, does not follow commands well enough to test EOM or cerebellar signs speech dysarthric due to dry mouth.    Psych: Oriented to name, "Sheri [name]" but not to month, and very sluggish and confused.      Data Reviewed: I have personally reviewed following labs and imaging studies:  CBC: Recent Labs  Lab 04/10/21 0342 04/11/21 0155 04/12/21 0046 04/13/21 0350 04/16/21 0000  WBC 9.5 10.7* 8.8 8.2 9.6  NEUTROABS 5.9 6.7 5.7 5.3  --   HGB 10.3* 10.3* 9.2* 9.1* 8.9*  HCT 32.7* 32.0* 29.1* 28.8* 27.5*  MCV 86.7 85.8 85.1 86.2 85.4  PLT 438* 453* 399 349 368   Basic Metabolic Panel: Recent Labs  Lab 04/10/21 0342 04/12/21 0046 04/14/21 0149 04/16/21 0000  NA 129* 132* 131* 130*  K 3.6 3.2* 3.6 3.2*  CL 92* 94* 94* 93*  CO2 27 30 29 30   GLUCOSE 118* 82 94 108*  BUN 17 14 9  6*  CREATININE 1.22* 1.02* 1.06* 0.93  CALCIUM 9.0 8.6* 8.5* 8.6*   GFR: Estimated Creatinine Clearance: 33 mL/min (by C-G formula based on SCr of 0.93 mg/dL). Liver Function Tests: No results for input(s): AST, ALT, ALKPHOS, BILITOT, PROT, ALBUMIN in the last 168 hours.  No results for input(s): LIPASE, AMYLASE in the last 168 hours. No results for input(s): AMMONIA in the last 168 hours. Coagulation Profile: No results for input(s): INR, PROTIME in the last 168 hours. Cardiac Enzymes: No results for input(s): CKTOTAL, CKMB, CKMBINDEX, TROPONINI in the  last 168 hours. BNP (last 3 results) No results for input(s): PROBNP in the last 8760 hours. HbA1C: No results for input(s): HGBA1C in the last 72 hours. CBG: Recent Labs  Lab 04/15/21 2106 04/16/21 0001 04/16/21 0329 04/16/21 0735 04/16/21 1649  GLUCAP 116* 106* 124* 120* 84   Lipid Profile: No results for input(s): CHOL, HDL, LDLCALC, TRIG, CHOLHDL, LDLDIRECT in the last 72 hours. Thyroid Function Tests: No results for input(s): TSH, T4TOTAL, FREET4, T3FREE, THYROIDAB in the last 72 hours. Anemia Panel: No results for input(s): VITAMINB12, FOLATE, FERRITIN, TIBC, IRON, RETICCTPCT in the last 72 hours. Urine analysis:    Component Value Date/Time   COLORURINE YELLOW 03/30/2021 2350   APPEARANCEUR CLEAR 03/30/2021 2350   LABSPEC 1.016 03/30/2021 2350   PHURINE 5.0 03/30/2021 2350   GLUCOSEU NEGATIVE 03/30/2021 2350   HGBUR SMALL (A) 03/30/2021 2350  BILIRUBINUR NEGATIVE 03/30/2021 2350   KETONESUR NEGATIVE 03/30/2021 2350   PROTEINUR NEGATIVE 03/30/2021 2350   NITRITE NEGATIVE 03/30/2021 2350   LEUKOCYTESUR NEGATIVE 03/30/2021 2350   Sepsis Labs: @LABRCNTIP (procalcitonin:4,lacticacidven:4)  ) No results found for this or any previous visit (from the past 240 hour(s)).        Radiology Studies: MR BRAIN WO CONTRAST  Result Date: 04/16/2021 CLINICAL DATA:  Acute neuro deficit. Rule out stroke. Altered mental status. The EXAM: MRI HEAD WITHOUT CONTRAST TECHNIQUE: Multiplanar, multiecho pulse sequences of the brain and surrounding structures were obtained without intravenous contrast. COMPARISON:  MRI head 04/08/2021 FINDINGS: Brain: Image quality degraded by motion Negative for acute infarct. Moderate white matter changes bilaterally due to chronic microvascular ischemia. No interval change. Mild hyperintensity in the pons. Negative for hemorrhage, mass, or fluid collection. Vascular: Normal arterial flow voids.  Hypoplastic basilar. Skull and upper cervical spine:  Negative Sinuses/Orbits: Paranasal sinuses clear. Mild mastoid effusion on the right. Right cataract extraction Other: None IMPRESSION: No acute abnormality no change from the recent MRI. Moderate white matter changes consistent with chronic microvascular ischemia. Electronically Signed   By: 06/08/2021 M.D.   On: 04/16/2021 13:41   DG CHEST PORT 1 VIEW  Result Date: 04/16/2021 CLINICAL DATA:  Cough, altered mental status EXAM: PORTABLE CHEST 1 VIEW COMPARISON:  Portable exam 0952 hours compared to 04/12/2021 FINDINGS: Upper normal heart size. Mediastinal contours and pulmonary vascularity normal. Calcified tortuous thoracic aorta. Bibasilar atelectasis versus infiltrate. Tiny bibasilar effusions. Remaining lungs clear. No pneumothorax or acute osseous findings. IMPRESSION: Bibasilar atelectasis. Aortic Atherosclerosis (ICD10-I70.0). Electronically Signed   By: 06/12/2021 M.D.   On: 04/16/2021 11:19        Scheduled Meds:  acetaminophen  500 mg Oral Q8H   diclofenac Sodium  2 g Topical QID   enoxaparin (LOVENOX) injection  30 mg Subcutaneous Q24H   feeding supplement  1 Container Oral Q24H   feeding supplement  237 mL Oral BID BM   ferrous sulfate  325 mg Oral Q breakfast   fluticasone furoate-vilanterol  1 puff Inhalation Daily   And   umeclidinium bromide  1 puff Inhalation Daily   guaiFENesin  600 mg Oral BID   hydrALAZINE  25 mg Oral Q8H   hydrochlorothiazide  12.5 mg Oral Daily   insulin aspart  0-9 Units Subcutaneous Q4H   latanoprost  1 drop Right Eye QHS   linagliptin  5 mg Oral Daily   mouth rinse  15 mL Mouth Rinse BID   metoprolol succinate  50 mg Oral Daily   mirtazapine  7.5 mg Oral QHS   multivitamin with minerals  1 tablet Oral Daily   pantoprazole  40 mg Oral Daily   penicillin g benzathine (BICILLIN-LA) IM  2.4 Million Units Intramuscular Weekly   rosuvastatin  20 mg Oral QHS   verapamil  240 mg Oral QHS   Continuous Infusions:  dextrose 5 % and 0.45 % NaCl  with KCl 20 mEq/L 100 mL/hr at 04/16/21 1744     LOS: 16 days    Time spent: 35 minutes    04/18/21, MD [facility] 04/16/2021, 7:28 PM     Please page though AMION or Epic secure chat:  For 04/18/2021, [employer]

## 2021-04-16 NOTE — Progress Notes (Signed)
Neurology Consultation  Reason for Consult: Encephalopathy  Referring Physician: C. Danford, MD.   CC: Altered mental status  History is obtained from: chart, daughter at bedside.   HPI: Sheri [name] is a 87 y.o. female with a PMHx of COPD, Chronic respiratory failure on 3L O2 at home, DM II, and HTN who was admitted on 03/31/21 for altered mental status with confusion and some hallucinations. She was hypercarbic on admission and in COPD exacerbation. Her mental status improved over time and her AMS was thought to be more consistent with delirium.   MRI brain and EEG were negative for acute finding/seizures. Her hypercarbia continued and at one point, she was put on Bipap. She completed her antibiotic and steroid course for COPD exacerbation. Also, metabolic derangements were found and are still being corrected (Na++, K+, hypomagnesia, hypophosphatemia, AKI). She was found to have latent syphilis and increased ammonia level which returned to normal.    Patient was ready to be discharged SNF on 04/09/21, but SNF unable to provide Bipap. Today, her ABG: pH 7.343, PCO2 58 and PO2 68. Per RN, patient had just returned from MRI and RN had placed her O2 on shortly before ABG was drawn, so this may have affected results.    MRI brain today showed no acute abnormality or change from recent MRI.   Per chart and daughter, patient's encephalopathy had improved prior to the administration of Haldol. Per daughter, patient is alert and oriented at baseline. She can walk, toilet herself, and converse coherently with family.   Neurology was consulted for encephalopathy.    ROS: Unable to obtain due to altered mental status.   Past Medical History:  Diagnosis Date   Bronchitis    COPD (chronic obstructive pulmonary disease) (HCC)    Hypertension     Family History  Problem Relation Age of Onset   Breast cancer Other    Heart attack Mother    Heart attack Father     Social History:   reports  that she quit smoking about 19 years ago. Her smoking use included cigarettes. She smoked an average of 0.50 packs per day. She has never used smokeless tobacco. She reports that she does not drink alcohol and does not use drugs.  Medications  Current Facility-Administered Medications:    acetaminophen (TYLENOL) tablet 500 mg, 500 mg, Oral, Q8H, Gonfa, Taye T, MD, 500 mg at 04/16/21 0642   albuterol (PROVENTIL) (2.5 MG/3ML) 0.083% nebulizer solution 2.5 mg, 2.5 mg, Nebulization, Q2H PRN, Julian Reil, Jared M, DO, 2.5 mg at 04/06/21 2137   diclofenac Sodium (VOLTAREN) 1 % topical gel 2 g, 2 g, Topical, QID, Gonfa, Taye T, MD, 2 g at 04/15/21 2131   enoxaparin (LOVENOX) injection 30 mg, 30 mg, Subcutaneous, Q24H, Pham, Minh Q, RPH-CPP, 30 mg at 04/15/21 1616   feeding supplement (BOOST / RESOURCE BREEZE) liquid 1 Container, 1 Container, Oral, Q24H, Danford, Earl Lites, MD, 1 Container at 04/15/21 1618   feeding supplement (ENSURE ENLIVE / ENSURE PLUS) liquid 237 mL, 237 mL, Oral, BID BM, Danford, Christopher P, MD, 237 mL at 04/15/21 1616   ferrous sulfate tablet 325 mg, 325 mg, Oral, Q breakfast, Julian Reil, Jared M, DO, 325 mg at 04/15/21 0925   fluticasone furoate-vilanterol (BREO ELLIPTA) 100-25 MCG/INH 1 puff, 1 puff, Inhalation, Daily, 1 puff at 04/16/21 0856 **AND** umeclidinium bromide (INCRUSE ELLIPTA) 62.5 MCG/INH 1 puff, 1 puff, Inhalation, Daily, Meredeth Ide, MD, 1 puff at 04/16/21 0859   guaiFENesin (MUCINEX) 12 hr  tablet 600 mg, 600 mg, Oral, BID, Alanda SlimGonfa, Taye T, MD, 600 mg at 04/15/21 2128   guaiFENesin-dextromethorphan (ROBITUSSIN DM) 100-10 MG/5ML syrup 5 mL, 5 mL, Oral, Q8H PRN, Alanda SlimGonfa, Taye T, MD, 5 mL at 04/13/21 0517   hydrALAZINE (APRESOLINE) tablet 25 mg, 25 mg, Oral, Q6H PRN, Meredeth IdeLama, Gagan S, MD, 25 mg at 04/04/21 1702   hydrALAZINE (APRESOLINE) tablet 25 mg, 25 mg, Oral, Q8H, Gonfa, Taye T, MD, 25 mg at 04/16/21 0641   hydrochlorothiazide (MICROZIDE) capsule 12.5 mg, 12.5 mg, Oral,  Daily, Julian ReilGardner, Jared M, DO, 12.5 mg at 04/15/21 0925   insulin aspart (novoLOG) injection 0-9 Units, 0-9 Units, Subcutaneous, Q4H, Pahwani, Ravi, MD, 1 Units at 04/16/21 0345   latanoprost (XALATAN) 0.005 % ophthalmic solution 1 drop, 1 drop, Right Eye, QHS, Julian ReilGardner, Jared M, DO, 1 drop at 04/15/21 2130   linagliptin (TRADJENTA) tablet 5 mg, 5 mg, Oral, Daily, Julian ReilGardner, Jared M, DO, 5 mg at 04/15/21 [id number]   MEDLINE mouth rinse, 15 mL, Mouth Rinse, BID, Sharl MaLama, Gagan S, MD, 15 mL at 04/16/21 0900   metoprolol succinate (TOPROL-XL) 24 hr tablet 50 mg, 50 mg, Oral, Daily, Julian ReilGardner, Jared M, DO, 50 mg at 04/15/21 [id number]   mirtazapine (REMERON) tablet 7.5 mg, 7.5 mg, Oral, QHS, Julian ReilGardner, Jared M, DO, 7.5 mg at 04/15/21 2128   multivitamin with minerals tablet 1 tablet, 1 tablet, Oral, Daily, Candelaria StagersGonfa, Taye T, MD, 1 tablet at 04/15/21 0925   pantoprazole (PROTONIX) EC tablet 40 mg, 40 mg, Oral, Daily, Julian ReilGardner, Jared M, DO, 40 mg at 04/15/21 [id number]   penicillin g benzathine (BICILLIN LA) 1200000 UNIT/2ML injection 2.4 Million Units, 2.4 Million Units, Intramuscular, Weekly, Pahwani, Daleen Boavi, MD, 2.4 Million Units at 04/12/21 1355   potassium chloride SA (KLOR-CON) CR tablet 20 mEq, 20 mEq, Oral, BID, Danford, Earl Liteshristopher P, MD   rosuvastatin (CRESTOR) tablet 20 mg, 20 mg, Oral, QHS, Julian ReilGardner, Jared M, DO, 20 mg at 04/15/21 2128   verapamil (CALAN-SR) CR tablet 240 mg, 240 mg, Oral, QHS, Julian ReilGardner, Jared M, DO, 240 mg at 04/15/21 2128   Exam: Current vital signs: BP (!) 153/76 (BP Location: Left Arm)   Pulse 78   Temp 97.6 F (36.4 C) (Oral)   Resp 15   Ht 5\' 2"  (1.575 m)   Wt 60.4 kg   SpO2 95%   BMI 24.35 kg/m  Vital signs in last 24 hours: Temp:  [97.6 F (36.4 C)-98.1 F (36.7 C)] 97.6 F (36.4 C) (06/16 1302) Pulse Rate:  [60-88] 78 (06/16 1302) Resp:  [15-20] 15 (06/16 0736) BP: (90-153)/(48-76) 153/76 (06/16 1302) SpO2:  [91 %-100 %] 95 % (06/16 1302)  PE: GENERAL: appears fairly well. She wakes  up to name calling.  HEENT: - Normocephalic and atraumatic. LUNGS - Normal respiratory effort.  CV - RRR. ABDOMEN - Soft, nontender. Ext: warm, well perfused. Psych: affect calm, cooperative.  NEURO:  Mental Status: Oriented to self, her daughter, place, and month. She is unable to tell NP day or date. She opened her eyes to loud name calling, but falls back to sleep quickly.  Speech/Language: Soft whispering, 1-2 words. She does not converse.  Cranial Nerves:  II: PERRL 323mm/brisk.  III, IV, VI: Lid elevation symmetric and full.  VII: Face is grossly symmetrical at rest.   VIII:hearing intact to voice IX, X: Hypophonic.  XI: head grossly midline.  WJX:BJYNWGXII:tongue is symmetrical without fasciculations.   Motor: RUE: grips  4-/5      LUE: grips  4-/5     triceps  /5    biceps   /5            Will not perform for LE assessment with NP. On follow up exam by attending, she briefly elevates each lower extremity antigravity and slightly resists examiner.  Tone is normal. Bulk is normal.  Sensation- Reacts to light touch.  Coordination: No tremors.  DTRs: BUEs brachioradialis 1+  BLEs patelae 0.  Gait- deferred  Labs I have reviewed labs in epic and the results pertinent to this consultation are: Today PCO2 58. PO2 68. K 3.2. Na 130.  CBC    Component Value Date/Time   WBC 9.6 04/16/2021 0000   RBC 3.22 (L) 04/16/2021 0000   HGB 8.9 (L) 04/16/2021 0000   HCT 27.5 (L) 04/16/2021 0000   PLT 368 04/16/2021 0000   MCV 85.4 04/16/2021 0000   MCH 27.6 04/16/2021 0000   MCHC 32.4 04/16/2021 0000   RDW 15.7 (H) 04/16/2021 0000   LYMPHSABS 1.7 04/13/2021 0350   MONOABS 0.9 04/13/2021 0350   EOSABS 0.2 04/13/2021 0350   BASOSABS 0.0 04/13/2021 0350    CMP     Component Value Date/Time   NA 130 (L) 04/16/2021 0000   K 3.2 (L) 04/16/2021 0000   CL 93 (L) 04/16/2021 0000   CO2 30 04/16/2021 0000   GLUCOSE 108 (H) 04/16/2021 0000   BUN 6 (L) 04/16/2021 0000   CREATININE 0.93 04/16/2021  0000   CALCIUM 8.6 (L) 04/16/2021 0000   PROT 6.3 (L) 04/09/2021 0131   ALBUMIN 2.7 (L) 04/09/2021 0131   AST 26 04/09/2021 0131   ALT 16 04/09/2021 0131   ALKPHOS 48 04/09/2021 0131   BILITOT 0.5 04/09/2021 0131   GFRNONAA 58 (L) 04/16/2021 0000   GFRAA 25 (L) 02/11/2020 1634    Imaging MD reviewed the images obtained  MRI brain 04/09/21.  No acute intracranial abnormality. Moderate chronic microvascular ischemic changes of the white matter. Remote lacunar infarct within the left side of the pons. Diminutive vertebrobasilar system, may be related to hypoplasia although stenosis cannot be excluded. Correlation with MR angiogram or CT angiogram suggested.  MRI brain 04/16/21 No acute abnormality no change from the recent MRI. Moderate white matter changes consistent with chronic microvascular ischemia.  Assessment: 87 yo female admitted 16 days ago for COPD exacerbation, altered mental status and hypercarbia. Chart was reviewed throughout stay. Per daughter, the patient was returning back to her baseline until patient received Haldol. Patient has had multiple reasons for metabolic encephalopathy, but this acute change after Haldol administration, Neurology team believes to be solely due to the Haldol.   Impression: -Multifactorial toxic/metabolic encephalopathy worsened s/p anti psychotic administration.  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   Recommendations/Plan:  -Delirium precautions-lights on during day and off at night, good sleep hygiene, early and frequent ambulation as able, and adequate pain management:. -Continue to correct metabolic derangements as you are doing.  -Avoid sedative medications and antipsychotics. D/c Haldol.  -Agree with more education or palliative care consult about her goals of care.  -Hypercarbia/hypoxia on latest ABG, but might be due to O2 being off for awhile and or chronic hypercarbia/respiratory failure.  -Continue to treat latent syphilis.   Pt seen by  Jimmye Norman, NP/Neuro and later by MD. Note/plan to be edited by MD as needed.  Pager: [phone]  I have seen and examined the patient. I have reviewed the assessment and recommendations. 87 year old female with 16 day  stay in the hospital for management of COPD exacerbation, hypercarbia and metabolic encephalopathy. She abruptly worsened after administration of Haldol. Exam findings as above. Recommendations include discontinuation of Haldol and avoidance of sedating meds.  Electronically signed: Dr. Caryl Pina;

## 2021-04-16 NOTE — Progress Notes (Signed)
Daily Progress Note   Patient Name: Sheri [name]       Date: 04/16/2021 DOB: Oct 15, 1926  Age: 87 y.o. MRN#: [id number] Attending Physician: Edwin Dada, * Primary Care Physician: Lucianne Lei, MD Admit Date: 03/30/2021  Reason for Consultation/Follow-up: Establishing goals of care  Subjective: Medical records reviewed. Assessed patient at the bedside and met with her daughter. She is resting, although appears mildly uncomfortable she will deny pain.  [name] shares the family's concerns that Haldol has negatively affected the progress patient was making with her improved mental status and nutrition. She confirms they are all still hoping for improvement and discharge to a nursing home. Discussed recent MRI and ABG results and educated on the importance of proper management with oxygenation. Discussed risks of delirium during prolonged hospitalization. Therapeutic and reflective listening was practiced as she shares an account of the patient's ability to care for herself for so long and the importance of her independence.  Discussed code status further and patient's daughter quickly states they have already spoken about this with patient's doctors. They have made this decision based on the patient's own voiced preferences after her husband passed away. I Inquired as to whether this has been discussed recently and she states they have not mentioned it to the patient. Encouraged ongoing discussions and the importance of continuing the conversation, as preferences tend to change throughout life and different situations.  Questions and concerns addressed. PMT will continue to support holistically.  Length of Stay: 16  Current Medications: Scheduled Meds:   acetaminophen  500  mg Oral Q8H   diclofenac Sodium  2 g Topical QID   enoxaparin (LOVENOX) injection  30 mg Subcutaneous Q24H   feeding supplement  1 Container Oral Q24H   feeding supplement  237 mL Oral BID BM   ferrous sulfate  325 mg Oral Q breakfast   fluticasone furoate-vilanterol  1 puff Inhalation Daily   And   umeclidinium bromide  1 puff Inhalation Daily   guaiFENesin  600 mg Oral BID   hydrALAZINE  25 mg Oral Q8H   hydrochlorothiazide  12.5 mg Oral Daily   insulin aspart  0-9 Units Subcutaneous Q4H   latanoprost  1 drop Right Eye QHS   linagliptin  5 mg Oral Daily   mouth rinse  15 mL Mouth  Rinse BID   metoprolol succinate  50 mg Oral Daily   mirtazapine  7.5 mg Oral QHS   multivitamin with minerals  1 tablet Oral Daily   pantoprazole  40 mg Oral Daily   penicillin g benzathine (BICILLIN-LA) IM  2.4 Million Units Intramuscular Weekly   rosuvastatin  20 mg Oral QHS   verapamil  240 mg Oral QHS    Continuous Infusions:  0.9 % NaCl with KCl 20 mEq / L      PRN Meds: albuterol, guaiFENesin-dextromethorphan, hydrALAZINE  Physical Exam Vitals and nursing note reviewed.  Constitutional:      General: She is sleeping.     Appearance: She is ill-appearing.     Interventions: Nasal cannula in place.     Comments: 2L  HENT:     Head: Normocephalic and atraumatic.  Cardiovascular:     Rate and Rhythm: Normal rate.  Pulmonary:     Effort: Pulmonary effort is normal.  Skin:    General: Skin is warm and dry.            Vital Signs: BP (!) 153/76 (BP Location: Left Arm)   Pulse 78   Temp 97.6 F (36.4 C) (Oral)   Resp 15   Ht $R'5\' 2"'Zu$  (1.575 m)   Wt 60.4 kg   SpO2 95%   BMI 24.35 kg/m  SpO2: SpO2: 95 % O2 Device: O2 Device: Nasal Cannula O2 Flow Rate: O2 Flow Rate (L/min): 2 L/min  Intake/output summary:  Intake/Output Summary (Last 24 hours) at 04/16/2021 1552 Last data filed at 04/16/2021 1058 Gross per 24 hour  Intake 240 ml  Output 1400 ml  Net -1160 ml   LBM: Last BM  Date: 04/15/21 Baseline Weight: Weight: 64.4 kg Most recent weight: Weight: 60.4 kg       Palliative Assessment/Data: 30%      Patient Active Problem List   Diagnosis Date Noted   Closed wedge compression fracture of T11 vertebra (HCC)    Goals of care, counseling/discussion    Acute on chronic respiratory failure with hypercapnia (HCC) 09/73/5329   Acute metabolic encephalopathy 92/42/6834   COPD exacerbation ([place]) 03/31/2021   Pulmonary hypertension ([place]) 04/18/2019   Chronic respiratory failure with hypoxia (HCC) 03/09/2018   Gait difficulty 02/06/2018   Chronic obstructive pulmonary disease ([place]) 01/04/2015   Former tobacco use 01/04/2015   Essential hypertension 01/04/2015   Acute renal failure ([place]) 01/04/2015   Anemia 01/04/2015    Palliative Care Assessment & Plan   Patient Profile: 87 y.o. female  with past medical history of COPD on 3L O2 at baseline, HTN, chronic respiratory failure, AAA, admitted on 03/30/2021 with acute encephalopathy and fall.   Patient's mental status has improved to baseline (oriented x2) with BiPAP and treatment of COPD exacerbation. She is awaiting discharge to either SNF with BiPAP or home with NIV. Palliative care has been consulted to assist with goals of care discussion.  Assessment COPD Chronic hypoxic respiratory failure, ABG worsened today Acute metabolic encephalopathy Dementia  Recommendations/Plan: Continue full code/full scope treatment - family remains hopeful for improvement and discharge to SNF. Encouraged continued discussion of code status PMT will continue to provide ongoing support  Goals of Care and Additional Recommendations: Limitations on Scope of Treatment: Full Scope Treatment  Prognosis:  Guarded to poor prognosis, non-compliant with BiPAP at times, advanced age, frailty, now sundowning  Discharge Planning: [facility] for rehab with Palliative care service follow-up  Total time: 20  minutes Greater than  50% of this time was spent in counseling and coordinating care related to the above assessment and plan.  Dorthy Cooler, PA-C Palliative Medicine Team Team phone # [phone]  Thank you for allowing the Palliative Medicine Team to assist in the care of this patient. Please utilize secure chat with additional questions, if there is no response within 30 minutes please call the above phone number.  Palliative Medicine Team providers are available by phone from 7am to 7pm daily and can be reached through the team cell phone.  Should this patient require assistance outside of these hours, please call the patient's attending physician.

## 2021-04-16 NOTE — Progress Notes (Signed)
PT Cancellation Note  Patient Details Name: Sheri [name] MRN: [id number] DOB: 1926-01-14   Cancelled Treatment:    Reason Eval/Treat Not Completed: Fatigue/lethargy limiting ability to participate  Ginette Otto, DPT Acute Rehabilitation Services [phone]   Sheri [name] 04/16/2021, 2:17 PM

## 2021-04-16 NOTE — Progress Notes (Signed)
BIPAP on standby at this time. 

## 2021-04-16 NOTE — Progress Notes (Signed)
Attempted to administer morning meds, crushed in pudding, however pt having difficulty swallowing.  Offered one spoonful, took her several minutes to finally get it all down.  Appeared to be pooling at the back of her mouth, a lot of gurgling.  I did not administer any further meds, and holding off on anything else po for now.  MD notified.  Pt being transported down to MRI at this time.

## 2021-04-17 DIAGNOSIS — J9622 Acute and chronic respiratory failure with hypercapnia: Secondary | ICD-10-CM | POA: Diagnosis not present

## 2021-04-17 LAB — CBC
HCT: 29.5 % — ABNORMAL LOW (ref 36.0–46.0)
Hemoglobin: 9.5 g/dL — ABNORMAL LOW (ref 12.0–15.0)
MCH: 27.6 pg (ref 26.0–34.0)
MCHC: 32.2 g/dL (ref 30.0–36.0)
MCV: 85.8 fL (ref 80.0–100.0)
Platelets: 376 10*3/uL (ref 150–400)
RBC: 3.44 MIL/uL — ABNORMAL LOW (ref 3.87–5.11)
RDW: 16.2 % — ABNORMAL HIGH (ref 11.5–15.5)
WBC: 8.5 10*3/uL (ref 4.0–10.5)
nRBC: 0 % (ref 0.0–0.2)

## 2021-04-17 LAB — BASIC METABOLIC PANEL
Anion gap: 7 (ref 5–15)
BUN: 8 mg/dL (ref 8–23)
CO2: 32 mmol/L (ref 22–32)
Calcium: 8.6 mg/dL — ABNORMAL LOW (ref 8.9–10.3)
Chloride: 92 mmol/L — ABNORMAL LOW (ref 98–111)
Creatinine, Ser: 0.98 mg/dL (ref 0.44–1.00)
GFR, Estimated: 54 mL/min — ABNORMAL LOW (ref >60)
Glucose, Bld: 104 mg/dL — ABNORMAL HIGH (ref 70–99)
Potassium: 3.3 mmol/L — ABNORMAL LOW (ref 3.5–5.1)
Sodium: 131 mmol/L — ABNORMAL LOW (ref 135–145)

## 2021-04-17 LAB — GLUCOSE, CAPILLARY
Glucose-Capillary: 109 mg/dL — ABNORMAL HIGH (ref 70–99)
Glucose-Capillary: 124 mg/dL — ABNORMAL HIGH (ref 70–99)
Glucose-Capillary: 124 mg/dL — ABNORMAL HIGH (ref 70–99)
Glucose-Capillary: 113 mg/dL — ABNORMAL HIGH (ref 70–99)
Glucose-Capillary: 114 mg/dL — ABNORMAL HIGH (ref 70–99)

## 2021-04-17 MED ORDER — ORAL CARE MOUTH RINSE
15.0000 mL | Freq: Two times a day (BID) | OROMUCOSAL | Status: DC
  Administered 2021-04-17 – 2021-04-20 (×8): 15 mL via OROMUCOSAL

## 2021-04-17 MED ORDER — POTASSIUM CHLORIDE 20 MEQ PO PACK
40.0000 meq | PACK | Freq: Once | ORAL | Status: AC
  Administered 2021-04-17: 40 meq via ORAL
  Filled 2021-04-17: qty 2

## 2021-04-17 MED ORDER — CHLORHEXIDINE GLUCONATE 0.12 % MT SOLN
15.0000 mL | Freq: Two times a day (BID) | OROMUCOSAL | Status: DC
  Administered 2021-04-17 – 2021-04-21 (×9): 15 mL via OROMUCOSAL
  Filled 2021-04-17 (×9): qty 15

## 2021-04-17 MED ORDER — GUAIFENESIN 100 MG/5ML PO SOLN
5.0000 mL | Freq: Two times a day (BID) | ORAL | Status: DC
  Administered 2021-04-17 – 2021-04-21 (×9): 100 mg via ORAL
  Filled 2021-04-17 (×9): qty 5

## 2021-04-17 NOTE — Progress Notes (Signed)
Occupational Therapy Treatment Patient Details Name: Sheri [name] MRN: [id number] DOB: 12/09/1925 Today's Date: 04/17/2021    History of present illness Pt is a 87 year old woman admitted on 03/31/21 with AMS, COPD exacerbation  and falls. Pt injured her L ankle/foot and hit her head in a fall. Head CT negative, MRI pending. PMH: COPD on 3L at home, T11 compression fx, HTN, AAA.   OT comments  Pt. Seen for skilled OT treatment session.  Bed mobility mod/max a for eob and back to bed. Initially agreeable to sitting up in recliner but once eob declined and requested back to bed secondary to light dizziness and R hip pain.  Rn in room at end of session and notified of pt. Reports.    Follow Up Recommendations  SNF;Supervision/Assistance - 24 hour    Equipment Recommendations  3 in 1 bedside commode    Recommendations for Other Services      Precautions / Restrictions Precautions Precautions: Fall Precaution Comments: frequent falls at home (2-3x week); modified diet       Mobility Bed Mobility Overal bed mobility: Needs Assistance Bed Mobility: Supine to Sit;Rolling Rolling: Mod assist   Supine to sit: Mod assist;HOB elevated Sit to supine: Max assist   General bed mobility comments: max verbal and tactile cues for hand placement on bed rails to aide in initiated roll and utilization of b ues to bring trunk upright and help bring b les to eob.  max a with use of pads to initiate ble movement and bring to eob.  pt. with c/o r hip pain with initial initiation of movement.  once eob states unabe to complete transfer to eob secondary to reported light dizziness.  rn notified and returned pt. to bed. max a for back to bed    Transfers                      Balance                                           ADL either performed or assessed with clinical judgement   ADL Overall ADL's : Needs assistance/impaired                                        General ADL Comments: focus of session, bed mobility in preparation for adl and mobility progression     Vision       Perception     Praxis      Cognition Arousal/Alertness: Awake/alert Behavior During Therapy: WFL for tasks assessed/performed                                            Exercises     Shoulder Instructions       General Comments      Pertinent Vitals/ Pain       Pain Assessment: Faces Faces Pain Scale: Hurts little more Pain Location: R hip with movement Pain Descriptors / Indicators: Grimacing;Guarding Pain Intervention(s): Repositioned;Limited activity within patient's tolerance  Home Living  Prior Functioning/Environment              Frequency  Min 2X/week        Progress Toward Goals  OT Goals(current goals can now be found in the care plan section)  Progress towards OT goals: Progressing toward goals     Plan Discharge plan remains appropriate    Co-evaluation                 AM-PAC OT "6 Clicks" Daily Activity     Outcome Measure   Help from another person eating meals?: A Little Help from another person taking care of personal grooming?: A Little Help from another person toileting, which includes using toliet, bedpan, or urinal?: A Lot Help from another person bathing (including washing, rinsing, drying)?: A Lot Help from another person to put on and taking off regular upper body clothing?: A Little Help from another person to put on and taking off regular lower body clothing?: A Lot 6 Click Score: 15    End of Session    OT Visit Diagnosis: Pain;Muscle weakness (generalized) (M62.81);Unsteadiness on feet (R26.81);Other symptoms and signs involving cognitive function   Activity Tolerance Patient limited by fatigue;Other (comment) (c/o light dizziness)   Patient Left in bed;with call bell/phone within reach;with bed alarm  set;with nursing/sitter in room   Nurse Communication Other (comment) (notified rn pt. with light dizziness and c/o r hip pain)        Time: 6237-6283 OT Time Calculation (min): 19 min  Charges: OT General Charges $OT Visit: 1 Visit OT Treatments $Self Care/Home Management : 8-22 mins  Boneta Lucks, COTA/L Acute Rehabilitation [phone]    Salvadore Oxford 04/17/2021, 12:22 PM

## 2021-04-17 NOTE — Progress Notes (Signed)
  Speech Language Pathology Treatment: Dysphagia  Patient Details Name: Sheri [name] MRN: [id number] DOB: 07-22-1926 Today's Date: 04/17/2021 Time: 8144-8185 SLP Time Calculation (min) (ACUTE ONLY): 14 min  Assessment / Plan / Recommendation Clinical Impression  Pt alert and participatory, now off BiPAP. Able to follow complex commands with repetition and modeling to complete swallowing exercises (mendelsohn x5, CTAR x25, Effortful swallow with all boluses given) though actual effort and muscular recruitment is still minimal. Pt is tolerating thin liquids without signs of aspiration, verbal cueing needed for pt to clear throat intermittently as had been recommended. She does independently verbalize awareness for upright posture and slow intake with small meals given that she feels she "fills up easy" and could regurgitate. Given the cause of pills sticking (weakness and anatomical differences on cervical spine) would not expect significant improvement in tolerance of whole pills at this point. Though it has been difficult to administer crushed/liquid meds, would not advise whole meds at this time time still. Pt to continue Mech soft/thin liquid diet.   HPI HPI: Pt is a 87 year old woman admitted on 03/31/21 with AMS, COPD exacerbation  and falls. Pt injured her L ankle/foot and hit her head in a fall. Head CT negative, CXR on admission negative. Esophageal dysphagia reported by pt's daughter to referring MD. Esophagram 08/06/19: Esophageal dysmotility, likely presbyesophagus. Small hiatal hernia with mild stricture as evidenced by underdistention at the gastroesophageal junction. PMH: COPD on 3L at home, t11 compression fx, HTN, AAA.      SLP Plan  Continue with current plan of care       Recommendations  Diet recommendations: Dysphagia 3 (mechanical soft);Thin liquid Liquids provided via: Cup;Straw Medication Administration: Crushed with puree Supervision: Staff to assist with self  feeding;Full supervision/cueing for compensatory strategies Compensations: Minimize environmental distractions;Slow rate;Small sips/bites;Clear throat intermittently                Oral Care Recommendations: Oral care BID Follow up Recommendations: 24 hour supervision/assistance;Skilled Nursing facility SLP Visit Diagnosis: Dysphagia, oropharyngeal phase (R13.12);Dysphagia, pharyngoesophageal phase (R13.14) Plan: Continue with current plan of care       GO                Sheri [name], [name] 04/17/2021, 11:31 AM

## 2021-04-17 NOTE — Procedures (Signed)
[facility] PROGRESS NOTE    Sheri [name]  IFO:[id number] DOB: 1926/03/29 DOA: 03/30/2021 PCP: Renaye Rakers, MD      Brief Narrative:  Sheri [name] is a 87 y.o. F with CODP, HTN who presented with confusion.  Per pulmonary consult: "87 year old female with prior history of former smoker (quit 19 yrs ago), COPD, chronic hypoxic respiratory failure on 3L home O2, dysphagia, DMT2, and HTN admitted to [facility] on 5/31 with acute COPD exacerbation and acute on chronic hypoxic/ hypercarbic respiratory failure with acute metabolic encephalopathy.   Also noted to have a fall 4 days prior to admission with family reporting recurrent falls over the last 4 months due to leg giving out."       Assessment & Plan:  Acute metabolic encephalopathy In the ED, the patient was confused and VBG showed hypercapnia, started on BiPAP. Of note, initial work up for encephalopathy positive for postive RPR and patient was begun treatment with  IM penicillin qweek  Patient had initial improvement of mental status was in the process of discharging home with home NIV , however developed sundowning on 6/14 , was given 2 mg IM Haldol and became sedated. she has been persistently very sluggish and lethargic.  Without fever.  No focal deficits.  MRI repeat normal.  ABG repeat with acidosis again, suspect due to not wearing oxygen consistently  There is diagnostic uncertainty about whether this is from Haldol effects, hypercapnia again, or consistent with neurosyphilis.  , Neurology  consulted and palliative care consulted as she appears to be failing to thrive with all therapies , she does not appear to have any meaningful neurological recovery.    --BiPAP at night -Avoid sedatives -delirium protocol -She also has poor oral intake, she is started on D5 half saline with K infusion on 6/16 -On 6/17 exam, she  appears very weak but  calm and cooperative, she is not oriented to time but to place and  person  Latent syphilis - Continue penicillin  COPD, Chronic hypoxic respiratory failure, on home O2 3 L  was admitted for COPD exacerbation , currently no evidence of flare, ABG overnight normal, thought due to not wearing oxygen consistently, continue BiPAP nightly, will need home NIV if able to discharge - Continue ICS/LABA/LAMA  non-insulin-dependent type II diabetes, A1c 5.9 She has poor oral intake, she is started on D5 half saline on 6/16  Hold linagliptin  - Continue sliding scale corrections   Hypertension Blood pressure controlled - Continue hydralazine, metoprolol, verapamil -Hold HCTZ due to poor oral intake, now started on hydration on 6/16   Hyperlipidemia - Continue Crestor  Dementia - Continue mirtazapine   Hyponatremia/Hypokalemia Hold HCTZ Replace K ,continue hydration Encouraged oral intake  Normocytic anemia anemia, likely anemia of chronic disease Hemoglobin stable about 9  CKD IIIa Creatinine at baseline      Disposition: Status is: Inpatient  Remains inpatient appropriate because:Altered mental status  Dispo: The patient is from: Home              Anticipated d/c is to: SNF with palliative care when mental status improves              Patient currently is not medically stable to d/c. Remain poor oral intake on ivf   Difficult to place patient yes, I was informed by RN that difficulty finding snf bed due to needing NIV or bipap qhs     Level of care: Med-Surg  MDM: The below labs and imaging reports were reviewed and summarized above.  Medication management as above.     DVT prophylaxis: enoxaparin (LOVENOX) injection 30 mg Start: 04/08/21 1430  Code Status: FULL Family Communication: none at the bedside         Subjective:   She seems is doing better today, she is calm and interactive, following commands, ;she still is very weak, with poor oral intake, dysphagia, on ivf, urine out put 500cc last 24hrs No  Fever, no headache, no chest pain, no seizures, no respiratory distress.  No wheezing.  Objective: Vitals:   04/16/21 2215 04/16/21 2310 04/16/21 2315 04/17/21 0350  BP: (!) 116/58  (!) 106/46 133/63  Pulse: 71  64 64  Resp:   20   Temp:  97.7 F (36.5 C)  98.5 F (36.9 C)  TempSrc:  Oral  Axillary  SpO2: 100%  100% 100%  Weight:      Height:        Intake/Output Summary (Last 24 hours) at 04/17/2021 0817 Last data filed at 04/17/2021 0600 Gross per 24 hour  Intake 1184.91 ml  Output 500 ml  Net 684.91 ml   Filed Weights   04/03/21 0915 04/07/21 0527 04/12/21 0436  Weight: 64.4 kg 63.1 kg 60.4 kg    Examination: General appearance: Elderly adult female, very weak, flat affect, but interactive and following command , she is oriented to place and person today, she is following commands, no agitation this am HEENT: Anicteric, conjunctiva pink, lids and lashes normal. No nasal deformity, discharge, epistaxis.  Lips moist dentures in place, oropharynx very dry, no oral lesions, hearing normal.   Skin: Warm and dry.  no jaundice.  No suspicious rashes or lesions. Cardiac: RRR, nl S1-S2, no murmurs appreciated.  Capillary refill is brisk.  JVP normal.  No LE edema.  Radial  pulses 2+ and symmetric. Respiratory: Normal respiratory rate and rhythm.  CTAB without rales or wheezes. Abdomen: Abdomen soft.  No TTP or guarding. No ascites, distension, hepatosplenomegaly.   MSK: No deformities or effusions. Neuro: Alert, awake, calm and cooperative, following command, she is oriented to place and person, not to time.    Psych: Flat affect, some psychomotor retardation, but calm and cooperative   Data Reviewed: I have personally reviewed following labs and imaging studies:  CBC: Recent Labs  Lab 04/11/21 0155 04/12/21 0046 04/13/21 0350 04/16/21 0000 04/17/21 0159  WBC 10.7* 8.8 8.2 9.6 8.5  NEUTROABS 6.7 5.7 5.3  --   --   HGB 10.3* 9.2* 9.1* 8.9* 9.5*  HCT 32.0* 29.1* 28.8*  27.5* 29.5*  MCV 85.8 85.1 86.2 85.4 85.8  PLT 453* 399 349 368 376   Basic Metabolic Panel: Recent Labs  Lab 04/12/21 0046 04/14/21 0149 04/16/21 0000 04/17/21 0159  NA 132* 131* 130* 131*  K 3.2* 3.6 3.2* 3.3*  CL 94* 94* 93* 92*  CO2 30 29 30  32  GLUCOSE 82 94 108* 104*  BUN 14 9 6* 8  CREATININE 1.02* 1.06* 0.93 0.98  CALCIUM 8.6* 8.5* 8.6* 8.6*   GFR: Estimated Creatinine Clearance: 31.3 mL/min (by C-G formula based on SCr of 0.98 mg/dL). Liver Function Tests: No results for input(s): AST, ALT, ALKPHOS, BILITOT, PROT, ALBUMIN in the last 168 hours.  No results for input(s): LIPASE, AMYLASE in the last 168 hours. No results for input(s): AMMONIA in the last 168 hours. Coagulation Profile: No results for input(s): INR, PROTIME in the last 168 hours. Cardiac Enzymes: No  results for input(s): CKTOTAL, CKMB, CKMBINDEX, TROPONINI in the last 168 hours. BNP (last 3 results) No results for input(s): PROBNP in the last 8760 hours. HbA1C: No results for input(s): HGBA1C in the last 72 hours. CBG: Recent Labs  Lab 04/16/21 0735 04/16/21 1649 04/16/21 1954 04/16/21 2333 04/17/21 0432  GLUCAP 120* 84 90 100* 109*   Lipid Profile: No results for input(s): CHOL, HDL, LDLCALC, TRIG, CHOLHDL, LDLDIRECT in the last 72 hours. Thyroid Function Tests: No results for input(s): TSH, T4TOTAL, FREET4, T3FREE, THYROIDAB in the last 72 hours. Anemia Panel: No results for input(s): VITAMINB12, FOLATE, FERRITIN, TIBC, IRON, RETICCTPCT in the last 72 hours. Urine analysis:    Component Value Date/Time   COLORURINE YELLOW 03/30/2021 2350   APPEARANCEUR CLEAR 03/30/2021 2350   LABSPEC 1.016 03/30/2021 2350   PHURINE 5.0 03/30/2021 2350   GLUCOSEU NEGATIVE 03/30/2021 2350   HGBUR SMALL (A) 03/30/2021 2350   BILIRUBINUR NEGATIVE 03/30/2021 2350   KETONESUR NEGATIVE 03/30/2021 2350   PROTEINUR NEGATIVE 03/30/2021 2350   NITRITE NEGATIVE 03/30/2021 2350   LEUKOCYTESUR NEGATIVE  03/30/2021 2350   Sepsis Labs: @LABRCNTIP (procalcitonin:4,lacticacidven:4)  ) No results found for this or any previous visit (from the past 240 hour(s)).        Radiology Studies: MR BRAIN WO CONTRAST  Result Date: 04/16/2021 CLINICAL DATA:  Acute neuro deficit. Rule out stroke. Altered mental status. The EXAM: MRI HEAD WITHOUT CONTRAST TECHNIQUE: Multiplanar, multiecho pulse sequences of the brain and surrounding structures were obtained without intravenous contrast. COMPARISON:  MRI head 04/08/2021 FINDINGS: Brain: Image quality degraded by motion Negative for acute infarct. Moderate white matter changes bilaterally due to chronic microvascular ischemia. No interval change. Mild hyperintensity in the pons. Negative for hemorrhage, mass, or fluid collection. Vascular: Normal arterial flow voids.  Hypoplastic basilar. Skull and upper cervical spine: Negative Sinuses/Orbits: Paranasal sinuses clear. Mild mastoid effusion on the right. Right cataract extraction Other: None IMPRESSION: No acute abnormality no change from the recent MRI. Moderate white matter changes consistent with chronic microvascular ischemia. Electronically Signed   By: Marlan Palauharles  Clark M.D.   On: 04/16/2021 13:41   DG CHEST PORT 1 VIEW  Result Date: 04/16/2021 CLINICAL DATA:  Cough, altered mental status EXAM: PORTABLE CHEST 1 VIEW COMPARISON:  Portable exam 0952 hours compared to 04/12/2021 FINDINGS: Upper normal heart size. Mediastinal contours and pulmonary vascularity normal. Calcified tortuous thoracic aorta. Bibasilar atelectasis versus infiltrate. Tiny bibasilar effusions. Remaining lungs clear. No pneumothorax or acute osseous findings. IMPRESSION: Bibasilar atelectasis. Aortic Atherosclerosis (ICD10-I70.0). Electronically Signed   By: Ulyses SouthwardMark  Boles M.D.   On: 04/16/2021 11:19        Scheduled Meds:  acetaminophen  500 mg Oral Q8H   chlorhexidine  15 mL Mouth Rinse BID   diclofenac Sodium  2 g Topical QID    enoxaparin (LOVENOX) injection  30 mg Subcutaneous Q24H   feeding supplement  1 Container Oral Q24H   feeding supplement  237 mL Oral BID BM   ferrous sulfate  325 mg Oral Q breakfast   fluticasone furoate-vilanterol  1 puff Inhalation Daily   And   umeclidinium bromide  1 puff Inhalation Daily   guaiFENesin  600 mg Oral BID   hydrALAZINE  25 mg Oral Q8H   hydrochlorothiazide  12.5 mg Oral Daily   insulin aspart  0-9 Units Subcutaneous Q4H   latanoprost  1 drop Right Eye QHS   linagliptin  5 mg Oral Daily   mouth rinse  15 mL Mouth Rinse  q12n4p   metoprolol succinate  50 mg Oral Daily   mirtazapine  7.5 mg Oral QHS   multivitamin with minerals  1 tablet Oral Daily   pantoprazole  40 mg Oral Daily   penicillin g benzathine (BICILLIN-LA) IM  2.4 Million Units Intramuscular Weekly   rosuvastatin  20 mg Oral QHS   verapamil  240 mg Oral QHS   Continuous Infusions:  dextrose 5 % and 0.45 % NaCl with KCl 20 mEq/L 100 mL/hr at 04/17/21 0736     LOS: 17 days    Time spent: 35 minutes    Albertine Grates, MD Phd FACP [facility] 04/17/2021, 8:17 AM     Please page though AMION or Epic secure chat:  For [employer], Higher education careers adviser

## 2021-04-17 NOTE — Progress Notes (Addendum)
PT Cancellation Note  Patient Details Name: LETINA LUCKETT MRN: [id number] DOB: 1926/04/01   Cancelled Treatment:    Reason Eval/Treat Not Completed: Other (comment) (pt working with SLP and OT.) Will continue efforts per PT POC next date as schedule permits.   Dorathy Kinsman Trayvond Viets 04/17/2021, 4:33 PM

## 2021-04-17 NOTE — Progress Notes (Signed)
SLP Cancellation Note  Patient Details Name: Sheri [name] MRN: [id number] DOB: 1926-10-25   Cancelled treatment:       Reason Eval/Treat Not Completed: Medical issues which prohibited therapy. Pt on Bipap   Sheri [name], [name] 04/17/2021, 9:57 AM

## 2021-04-18 DIAGNOSIS — J9622 Acute and chronic respiratory failure with hypercapnia: Secondary | ICD-10-CM | POA: Diagnosis not present

## 2021-04-18 LAB — BASIC METABOLIC PANEL
Anion gap: 6 (ref 5–15)
BUN: 7 mg/dL — ABNORMAL LOW (ref 8–23)
CO2: 30 mmol/L (ref 22–32)
Calcium: 8.4 mg/dL — ABNORMAL LOW (ref 8.9–10.3)
Chloride: 94 mmol/L — ABNORMAL LOW (ref 98–111)
Creatinine, Ser: 1 mg/dL (ref 0.44–1.00)
GFR, Estimated: 53 mL/min — ABNORMAL LOW (ref >60)
Glucose, Bld: 111 mg/dL — ABNORMAL HIGH (ref 70–99)
Potassium: 3.7 mmol/L (ref 3.5–5.1)
Sodium: 130 mmol/L — ABNORMAL LOW (ref 135–145)

## 2021-04-18 LAB — GLUCOSE, CAPILLARY
Glucose-Capillary: 107 mg/dL — ABNORMAL HIGH (ref 70–99)
Glucose-Capillary: 108 mg/dL — ABNORMAL HIGH (ref 70–99)
Glucose-Capillary: 113 mg/dL — ABNORMAL HIGH (ref 70–99)
Glucose-Capillary: 120 mg/dL — ABNORMAL HIGH (ref 70–99)
Glucose-Capillary: 120 mg/dL — ABNORMAL HIGH (ref 70–99)
Glucose-Capillary: 126 mg/dL — ABNORMAL HIGH (ref 70–99)

## 2021-04-18 LAB — MAGNESIUM: Magnesium: 1.7 mg/dL (ref 1.7–2.4)

## 2021-04-18 NOTE — Progress Notes (Signed)
PROGRESS NOTE  MASAKO OVERALL EQA:[id number] DOB: May 08, 1926 DOA: 03/30/2021 PCP: Renaye Rakers, MD  HPI/Recap of past 24 hours: This is a 42 [name] 87 year old female admitted Mar 31, 2021 with altered mental status COPD exacerbation falls.  Chronic respiratory failure on 3 L/min oxygen at home history of T11 compression fracture hypertension and AAA patient sustained injury to her left ankle and foot as well as head trauma.  Head CT however was negative.  Patient seen and examined at bedside nurse reported that patient is more awake actually this is the first time she is feeding herself she did not call from the nurse to drink out of it by herself.  Patient noted that she feels fine  Assessment/Plan: Principal Problem:   Acute metabolic encephalopathy Active Problems:   Chronic obstructive pulmonary disease (HCC)   Essential hypertension   Chronic respiratory failure with hypoxia (HCC)   Acute on chronic respiratory failure with hypercapnia (HCC)   COPD exacerbation (HCC)   Closed wedge compression fracture of T11 vertebra (HCC)   Goals of care, counseling/discussion   Esophageal dysphagia   1.  Metabolic encephalopathy improving patient is more awake and eating.  She was started on BiPAP in the ED because of hypercapnia  2.  Latent syphilis positive RPR.  Incidental finding of positive RPR and she has been started on IM penicillin every week  3.  Patient had a relapse of her cognition recently after initial improvement was not certain the cause of it with it was from Haldol effect hypercarbia or neurosyphilis neurology was therefore consulted as well as palliative care. There is some improvement we will continue BiPAP at night avoid sedative continue delirium protocol  4.  COPD with chronic hypoxic respiratory failure on 3 L/min home O2 Continue BiPAP at night Continue ICS/LABA/LAMA  5.  None insulin-dependent diabetes mellitus. Due to initial poor p.o. intake she was started  on D5 half-normal saline. Linagliptin has been on hold We will continue insulin coverage  6.  Chronic kidney disease stage III A Creatinine is at baseline  7.  Normocytic anemia likely from chronic kidney disease continue to to monitor  8.  Dementia continue mirtazapine  9.  Hyperlipidemia continue Crestor  10.  Hyponatremia/hypokalemia. HCTZ on hold continue to encourage oral intake patient's mentation is improved and she is drinking more Monitor and replace potassium accordingly   Code Status: Full  Severity of Illness: The appropriate patient status for this patient is INPATIENT. Inpatient status is judged to be reasonable and necessary in order to provide the required intensity of service to ensure the patient's safety. The patient's presenting symptoms, physical exam findings, and initial radiographic and laboratory data in the context of their chronic comorbidities is felt to place them at high risk for further clinical deterioration. Furthermore, it is not anticipated that the patient will be medically stable for discharge from the hospital within 2 midnights of admission. The following factors support the patient status of inpatient.  Patient admitted for metabolic encephalopathy and respiratory failure currently on BiPAP she is waiting for SNF.  * I certify that at the point of admission it is my clinical judgment that the patient will require inpatient hospital care spanning beyond 2 midnights from the point of admission due to high intensity of service, high risk for further deterioration and high frequency of surveillance required.*   Family Communication: None at bedside  Disposition Plan: For SNF   Consultants: Palliative care Neurology  Procedures: None  Antimicrobials: None  DVT prophylaxis: Lovenox   Objective: Vitals:   04/18/21 0004 04/18/21 0419 04/18/21 0836 04/18/21 0842  BP: (!) 120/57 122/71 128/60   Pulse: 64 61 60   Resp: 18 16 16    Temp:  98.6 F (37 C) 98.3 F (36.8 C) 98 F (36.7 C)   TempSrc: Oral Oral Oral   SpO2: 100% 98% 99% 100%  Weight:      Height:        Intake/Output Summary (Last 24 hours) at 04/18/2021 0905 Last data filed at 04/18/2021 0420 Gross per 24 hour  Intake 475 ml  Output 1650 ml  Net -1175 ml   Filed Weights   04/03/21 0915 04/07/21 0527 04/12/21 0436  Weight: 64.4 kg 63.1 kg 60.4 kg   Body mass index is 24.35 kg/m.  Exam:  General: 87 y.o. year-old female well developed well nourished in no acute distress.  Frail looking elderly female.  Alert and oriented x1 Cardiovascular: Regular rate and rhythm with no rubs or gallops.  No thyromegaly or JVD noted.   Respiratory: Clear to auscultation with no wheezes or rales. Good inspiratory effort. Abdomen: Soft nontender nondistended with normal bowel sounds x4 quadrants. Musculoskeletal: No lower extremity edema. 2/4 pulses in all 4 extremities. Skin: No ulcerative lesions noted or rashes, Psychiatry: Mood is appropriate for condition and setting    Data Reviewed: CBC: Recent Labs  Lab 04/12/21 0046 04/13/21 0350 04/16/21 0000 04/17/21 0159  WBC 8.8 8.2 9.6 8.5  NEUTROABS 5.7 5.3  --   --   HGB 9.2* 9.1* 8.9* 9.5*  HCT 29.1* 28.8* 27.5* 29.5*  MCV 85.1 86.2 85.4 85.8  PLT 399 349 368 376   Basic Metabolic Panel: Recent Labs  Lab 04/12/21 0046 04/14/21 0149 04/16/21 0000 04/17/21 0159 04/18/21 0055  NA 132* 131* 130* 131* 130*  K 3.2* 3.6 3.2* 3.3* 3.7  CL 94* 94* 93* 92* 94*  CO2 30 29 30  32 30  GLUCOSE 82 94 108* 104* 111*  BUN 14 9 6* 8 7*  CREATININE 1.02* 1.06* 0.93 0.98 1.00  CALCIUM 8.6* 8.5* 8.6* 8.6* 8.4*  MG  --   --   --   --  1.7   GFR: Estimated Creatinine Clearance: 30.7 mL/min (by C-G formula based on SCr of 1 mg/dL). Liver Function Tests: No results for input(s): AST, ALT, ALKPHOS, BILITOT, PROT, ALBUMIN in the last 168 hours. No results for input(s): LIPASE, AMYLASE in the last 168 hours. No  results for input(s): AMMONIA in the last 168 hours. Coagulation Profile: No results for input(s): INR, PROTIME in the last 168 hours. Cardiac Enzymes: No results for input(s): CKTOTAL, CKMB, CKMBINDEX, TROPONINI in the last 168 hours. BNP (last 3 results) No results for input(s): PROBNP in the last 8760 hours. HbA1C: No results for input(s): HGBA1C in the last 72 hours. CBG: Recent Labs  Lab 04/17/21 1738 04/17/21 2020 04/18/21 0007 04/18/21 0423 04/18/21 0844  GLUCAP 113* 114* 108* 113* 107*   Lipid Profile: No results for input(s): CHOL, HDL, LDLCALC, TRIG, CHOLHDL, LDLDIRECT in the last 72 hours. Thyroid Function Tests: No results for input(s): TSH, T4TOTAL, FREET4, T3FREE, THYROIDAB in the last 72 hours. Anemia Panel: No results for input(s): VITAMINB12, FOLATE, FERRITIN, TIBC, IRON, RETICCTPCT in the last 72 hours. Urine analysis:    Component Value Date/Time   COLORURINE YELLOW 03/30/2021 2350   APPEARANCEUR CLEAR 03/30/2021 2350   LABSPEC 1.016 03/30/2021 2350   PHURINE 5.0 03/30/2021 2350   GLUCOSEU NEGATIVE 03/30/2021 2350  HGBUR SMALL (A) 03/30/2021 2350   BILIRUBINUR NEGATIVE 03/30/2021 2350   KETONESUR NEGATIVE 03/30/2021 2350   PROTEINUR NEGATIVE 03/30/2021 2350   NITRITE NEGATIVE 03/30/2021 2350   LEUKOCYTESUR NEGATIVE 03/30/2021 2350   Sepsis Labs: @LABRCNTIP (procalcitonin:4,lacticidven:4)  )No results found for this or any previous visit (from the past 240 hour(s)).    Studies: No results found.  Scheduled Meds:  acetaminophen  500 mg Oral Q8H   chlorhexidine  15 mL Mouth Rinse BID   diclofenac Sodium  2 g Topical QID   enoxaparin (LOVENOX) injection  30 mg Subcutaneous Q24H   feeding supplement  1 Container Oral Q24H   feeding supplement  237 mL Oral BID BM   ferrous sulfate  325 mg Oral Q breakfast   fluticasone furoate-vilanterol  1 puff Inhalation Daily   And   umeclidinium bromide  1 puff Inhalation Daily   guaiFENesin  5 mL Oral BID    hydrALAZINE  25 mg Oral Q8H   insulin aspart  0-9 Units Subcutaneous Q4H   latanoprost  1 drop Right Eye QHS   mouth rinse  15 mL Mouth Rinse q12n4p   metoprolol succinate  50 mg Oral Daily   mirtazapine  7.5 mg Oral QHS   multivitamin with minerals  1 tablet Oral Daily   pantoprazole  40 mg Oral Daily   penicillin g benzathine (BICILLIN-LA) IM  2.4 Million Units Intramuscular Weekly   rosuvastatin  20 mg Oral QHS   verapamil  240 mg Oral QHS    Continuous Infusions:  dextrose 5 % and 0.45 % NaCl with KCl 20 mEq/L 100 mL/hr at 04/18/21 0437     LOS: 18 days     04/20/21, MD [facility]  To reach me or the doctor on call, go to: [email or web address] Password [facility]  04/18/2021, 9:05 AM

## 2021-04-19 DIAGNOSIS — J9622 Acute and chronic respiratory failure with hypercapnia: Secondary | ICD-10-CM | POA: Diagnosis not present

## 2021-04-19 LAB — GLUCOSE, CAPILLARY
Glucose-Capillary: 108 mg/dL — ABNORMAL HIGH (ref 70–99)
Glucose-Capillary: 111 mg/dL — ABNORMAL HIGH (ref 70–99)
Glucose-Capillary: 139 mg/dL — ABNORMAL HIGH (ref 70–99)
Glucose-Capillary: 81 mg/dL (ref 70–99)
Glucose-Capillary: 90 mg/dL (ref 70–99)
Glucose-Capillary: 92 mg/dL (ref 70–99)

## 2021-04-19 LAB — BLOOD GAS, ARTERIAL
Acid-Base Excess: 2.9 mmol/L — ABNORMAL HIGH (ref 0.0–2.0)
Bicarbonate: 27.8 mmol/L (ref 20.0–28.0)
Drawn by: 24686
FIO2: 28
O2 Saturation: 97.8 %
Patient temperature: 37
pCO2 arterial: 49.9 mmHg — ABNORMAL HIGH (ref 32.0–48.0)
pH, Arterial: 7.364 (ref 7.350–7.450)
pO2, Arterial: 102 mmHg (ref 83.0–108.0)

## 2021-04-19 LAB — BASIC METABOLIC PANEL
Anion gap: 6 (ref 5–15)
BUN: 8 mg/dL (ref 8–23)
CO2: 27 mmol/L (ref 22–32)
Calcium: 8.4 mg/dL — ABNORMAL LOW (ref 8.9–10.3)
Chloride: 99 mmol/L (ref 98–111)
Creatinine, Ser: 0.92 mg/dL (ref 0.44–1.00)
GFR, Estimated: 58 mL/min — ABNORMAL LOW (ref >60)
Glucose, Bld: 100 mg/dL — ABNORMAL HIGH (ref 70–99)
Potassium: 4.7 mmol/L (ref 3.5–5.1)
Sodium: 132 mmol/L — ABNORMAL LOW (ref 135–145)

## 2021-04-19 NOTE — Progress Notes (Signed)
Took patient off of BiPAP for breakfast

## 2021-04-19 NOTE — Progress Notes (Signed)
Mobility Specialist - Progress Note   04/19/21 1615  Mobility  Activity Transferred:  Chair to bed  Level of Assistance Moderate assist, patient does 50-74%  Assistive Device None  Mobility Ambulated with assistance in room  Mobility Response Tolerated well  Mobility performed by Mobility specialist  $Mobility charge 1 Mobility   Pt assisted into bed at RN's request. She was mod, handheld assist to pivot from recliner to bed. Pt asx throughout. She was left in bed w/ the call bell at side and bed alarm on.   Mamie Levers Mobility Specialist Mobility Specialist Phone: [phone]

## 2021-04-19 NOTE — Progress Notes (Addendum)
PROGRESS NOTE  Sheri [name]:[id number] DOB: 01/28/1926 DOA: 03/30/2021 PCP: Renaye Rakers, MD  HPI/Recap of past 24 hours: This is a 35 Sheri [name] 87 year old female admitted Mar 31, 2021 with altered mental status COPD exacerbation falls.  Chronic respiratory failure on 3 L/min oxygen at home history of T11 compression fracture hypertension and AAA patient sustained injury to her left ankle and foot as well as head trauma.  Head CT however was negative.  Patient seen and examined at bedside nurse reported that patient is more awake actually this is the first time she is feeding herself she did not call from the nurse to drink out of it by herself.  Patient noted that she feels fine  Update April 19, 2021 Patient seen and examined at bedside she is doing well she is eating well    Assessment/Plan: Principal Problem:   Acute metabolic encephalopathy Active Problems:   Chronic obstructive pulmonary disease (HCC)   Essential hypertension   Chronic respiratory failure with hypoxia (HCC)   Acute on chronic respiratory failure with hypercapnia (HCC)   COPD exacerbation (HCC)   Closed wedge compression fracture of T11 vertebra (HCC)   Goals of care, counseling/discussion   Esophageal dysphagia   1.  Metabolic encephalopathy improving patient is more awake and eating.  She was started on BiPAP in the ED because of hypercapnia  2.  Latent syphilis positive RPR.  Incidental finding of positive RPR and she has been started on IM penicillin every week  3.  Patient had a relapse of her cognition recently after initial improvement was not certain the cause of it with it was from Haldol effect hypercarbia or neurosyphilis neurology was therefore consulted as well as palliative care. There is some improvement we will continue BiPAP at night avoid sedative continue delirium protocol  4.  COPD with chronic hypoxic respiratory failure on 3 L/min home O2 Continue BiPAP at night Continue  ICS/LABA/LAMA  5.  None insulin-dependent diabetes mellitus. Due to initial poor p.o. intake she was started on D5 half-normal saline. Linagliptin has been on hold We will continue insulin coverage  6.  Chronic kidney disease stage III A Creatinine is at baseline  7.  Normocytic anemia likely from chronic kidney disease continue to to monitor  8.  Dementia continue mirtazapine  9.  Hyperlipidemia continue Crestor  10.  Hyponatremia/hypokalemia. HCTZ on hold continue to encourage oral intake patient's mentation is improved and she is drinking more Monitor and replace potassium accordingly   Code Status: Full  Severity of Illness: The appropriate patient status for this patient is INPATIENT. Inpatient status is judged to be reasonable and necessary in order to provide the required intensity of service to ensure the patient's safety. The patient's presenting symptoms, physical exam findings, and initial radiographic and laboratory data in the context of their chronic comorbidities is felt to place them at high risk for further clinical deterioration. Furthermore, it is not anticipated that the patient will be medically stable for discharge from the hospital within 2 midnights of admission. The following factors support the patient status of inpatient.  Patient admitted for metabolic encephalopathy and respiratory failure currently on BiPAP she is waiting for SNF.  * I certify that at the point of admission it is my clinical judgment that the patient will require inpatient hospital care spanning beyond 2 midnights from the point of admission due to high intensity of service, high risk for further deterioration and high frequency of surveillance required.*   Family  Communication: None at bedside  Disposition Plan: For SNF   Consultants: Palliative care Neurology  Procedures: None  Antimicrobials: None  DVT prophylaxis: Lovenox   Objective: Vitals:   04/19/21 0026 04/19/21  0407 04/19/21 0851 04/19/21 0859  BP: 111/61 117/71 (!) 109/48   Pulse: 60 63 60   Resp: 16 18 18    Temp: 98.2 F (36.8 C) 98.1 F (36.7 C) 97.6 F (36.4 C)   TempSrc: Oral Oral Oral   SpO2: 100% 100% 98% 100%  Weight:      Height:        Intake/Output Summary (Last 24 hours) at 04/19/2021 0940 Last data filed at 04/19/2021 0027 Gross per 24 hour  Intake 474 ml  Output 800 ml  Net -326 ml    Filed Weights   04/03/21 0915 04/07/21 0527 04/12/21 0436  Weight: 64.4 kg 63.1 kg 60.4 kg   Body mass index is 24.35 kg/m.  Exam:  General: 87 y.o. year-old female well developed well nourished in no acute distress.  Frail looking elderly female.  Alert and oriented x1 Cardiovascular: Regular rate and rhythm with no rubs or gallops.  No thyromegaly or JVD noted.   Respiratory: Clear to auscultation with no wheezes or rales. Good inspiratory effort. Abdomen: Soft nontender nondistended with normal bowel sounds x4 quadrants. Musculoskeletal: No lower extremity edema. 2/4 pulses in all 4 extremities. Skin: No ulcerative lesions noted or rashes, Psychiatry: Mood is appropriate for condition and setting    Data Reviewed: CBC: Recent Labs  Lab 04/13/21 0350 04/16/21 0000 04/17/21 0159  WBC 8.2 9.6 8.5  NEUTROABS 5.3  --   --   HGB 9.1* 8.9* 9.5*  HCT 28.8* 27.5* 29.5*  MCV 86.2 85.4 85.8  PLT 349 368 376    Basic Metabolic Panel: Recent Labs  Lab 04/14/21 0149 04/16/21 0000 04/17/21 0159 04/18/21 0055  NA 131* 130* 131* 130*  K 3.6 3.2* 3.3* 3.7  CL 94* 93* 92* 94*  CO2 29 30 32 30  GLUCOSE 94 108* 104* 111*  BUN 9 6* 8 7*  CREATININE 1.06* 0.93 0.98 1.00  CALCIUM 8.5* 8.6* 8.6* 8.4*  MG  --   --   --  1.7    GFR: Estimated Creatinine Clearance: 30.7 mL/min (by C-G formula based on SCr of 1 mg/dL). Liver Function Tests: No results for input(s): AST, ALT, ALKPHOS, BILITOT, PROT, ALBUMIN in the last 168 hours. No results for input(s): LIPASE, AMYLASE in the  last 168 hours. No results for input(s): AMMONIA in the last 168 hours. Coagulation Profile: No results for input(s): INR, PROTIME in the last 168 hours. Cardiac Enzymes: No results for input(s): CKTOTAL, CKMB, CKMBINDEX, TROPONINI in the last 168 hours. BNP (last 3 results) No results for input(s): PROBNP in the last 8760 hours. HbA1C: No results for input(s): HGBA1C in the last 72 hours. CBG: Recent Labs  Lab 04/18/21 1632 04/18/21 2045 04/19/21 0030 04/19/21 0409 04/19/21 0850  GLUCAP 120* 126* 139* 108* 92    Lipid Profile: No results for input(s): CHOL, HDL, LDLCALC, TRIG, CHOLHDL, LDLDIRECT in the last 72 hours. Thyroid Function Tests: No results for input(s): TSH, T4TOTAL, FREET4, T3FREE, THYROIDAB in the last 72 hours. Anemia Panel: No results for input(s): VITAMINB12, FOLATE, FERRITIN, TIBC, IRON, RETICCTPCT in the last 72 hours. Urine analysis:    Component Value Date/Time   COLORURINE YELLOW 03/30/2021 2350   APPEARANCEUR CLEAR 03/30/2021 2350   LABSPEC 1.016 03/30/2021 2350   PHURINE 5.0 03/30/2021 2350  GLUCOSEU NEGATIVE 03/30/2021 2350   HGBUR SMALL (A) 03/30/2021 2350   BILIRUBINUR NEGATIVE 03/30/2021 2350   KETONESUR NEGATIVE 03/30/2021 2350   PROTEINUR NEGATIVE 03/30/2021 2350   NITRITE NEGATIVE 03/30/2021 2350   LEUKOCYTESUR NEGATIVE 03/30/2021 2350   Sepsis Labs: @LABRCNTIP (procalcitonin:4,lacticidven:4)  )No results found for this or any previous visit (from the past 240 hour(s)).    Studies: No results found.  Scheduled Meds:  acetaminophen  500 mg Oral Q8H   chlorhexidine  15 mL Mouth Rinse BID   diclofenac Sodium  2 g Topical QID   enoxaparin (LOVENOX) injection  30 mg Subcutaneous Q24H   feeding supplement  1 Container Oral Q24H   feeding supplement  237 mL Oral BID BM   ferrous sulfate  325 mg Oral Q breakfast   fluticasone furoate-vilanterol  1 puff Inhalation Daily   And   umeclidinium bromide  1 puff Inhalation Daily    guaiFENesin  5 mL Oral BID   hydrALAZINE  25 mg Oral Q8H   insulin aspart  0-9 Units Subcutaneous Q4H   latanoprost  1 drop Right Eye QHS   mouth rinse  15 mL Mouth Rinse q12n4p   metoprolol succinate  50 mg Oral Daily   mirtazapine  7.5 mg Oral QHS   multivitamin with minerals  1 tablet Oral Daily   pantoprazole  40 mg Oral Daily   penicillin g benzathine (BICILLIN-LA) IM  2.4 Million Units Intramuscular Weekly   rosuvastatin  20 mg Oral QHS   verapamil  240 mg Oral QHS    Continuous Infusions:  dextrose 5 % and 0.45 % NaCl with KCl 20 mEq/L 100 mL/hr at 04/18/21 1534     LOS: 19 days     04/20/21, MD [facility]  To reach me or the doctor on call, go to: [email or web address] Password [facility]  04/19/2021, 9:40 AM

## 2021-04-19 NOTE — Progress Notes (Signed)
RT note. Patient placed on bipap at this time, 16/6R10 30% sat 99% w/ stable VS. RT will continue to monitor.

## 2021-04-20 DIAGNOSIS — J9622 Acute and chronic respiratory failure with hypercapnia: Secondary | ICD-10-CM | POA: Diagnosis not present

## 2021-04-20 LAB — BASIC METABOLIC PANEL
Anion gap: 7 (ref 5–15)
BUN: 7 mg/dL — ABNORMAL LOW (ref 8–23)
CO2: 28 mmol/L (ref 22–32)
Calcium: 8.7 mg/dL — ABNORMAL LOW (ref 8.9–10.3)
Chloride: 99 mmol/L (ref 98–111)
Creatinine, Ser: 0.94 mg/dL (ref 0.44–1.00)
GFR, Estimated: 57 mL/min — ABNORMAL LOW (ref >60)
Glucose, Bld: 94 mg/dL (ref 70–99)
Potassium: 4.7 mmol/L (ref 3.5–5.1)
Sodium: 134 mmol/L — ABNORMAL LOW (ref 135–145)

## 2021-04-20 LAB — GLUCOSE, CAPILLARY
Glucose-Capillary: 98 mg/dL (ref 70–99)
Glucose-Capillary: 103 mg/dL — ABNORMAL HIGH (ref 70–99)
Glucose-Capillary: 107 mg/dL — ABNORMAL HIGH (ref 70–99)
Glucose-Capillary: 111 mg/dL — ABNORMAL HIGH (ref 70–99)

## 2021-04-20 NOTE — Progress Notes (Addendum)
PROGRESS NOTE  GILBERTA PEETERS CHE:[id number] DOB: December 09, 1925 DOA: 03/30/2021 PCP: Renaye Rakers, MD  HPI/Recap of past 24 hours: This is a 87 [name] 87 year old female admitted Mar 31, 2021 with altered mental status COPD exacerbation falls.  Chronic respiratory failure on 3 L/min oxygen at home history of T11 compression fracture hypertension and AAA patient sustained injury to her left ankle and foot as well as head trauma.  Head CT however was negative.  Patient seen and examined at bedside nurse reported that patient is more awake actually this is the first time she is feeding herself she did not call from the nurse to drink out of it by herself.  Patient noted that she feels fine  Update April 19, 2021 Patient seen and examined at bedside she is doing well she is eating well  April 20, 2021 Patient seen and examined at bedside with her daughter [name] at bedside.  She tells me that patient would like to go home or they would like to take her mother home.  She is also found CPAP machine that can be used at home.  Patient is alert oriented to person and place she is eating breakfast by herself presently sitting up in bed  Assessment/Plan: Principal Problem:   Acute metabolic encephalopathy Active Problems:   Chronic obstructive pulmonary disease (HCC)   Essential hypertension   Chronic respiratory failure with hypoxia (HCC)   Acute on chronic respiratory failure with hypercapnia (HCC)   COPD exacerbation (HCC)   Closed wedge compression fracture of T11 vertebra (HCC)   Goals of care, counseling/discussion   Esophageal dysphagia   1.  Metabolic encephalopathy improving patient is more awake and eating.  She was started on BiPAP in the ED because of hypercapnia  2.  Latent syphilis positive RPR.  Incidental finding of positive RPR and she has been started on IM penicillin every week  3.  Patient had a relapse of her cognition recently after initial improvement was not certain the  cause of it with it was from Haldol effect hypercarbia or neurosyphilis neurology was therefore consulted as well as palliative care. There is some improvement we will continue BiPAP at night avoid sedative continue delirium protocol  4.  COPD with chronic hypoxic respiratory failure on 3 L/min home O2 Continue BiPAP at night Continue ICS/LABA/LAMA She has been approved for home NIV for her BiPAP at night.  And this will be set up prior to discharge   5.  None insulin-dependent diabetes mellitus. Due to initial poor p.o. intake she was started on D5 half-normal saline. Linagliptin has been on hold We will continue insulin coverage  6.  Chronic kidney disease stage III A Creatinine is at baseline  7.  Normocytic anemia likely from chronic kidney disease continue to to monitor  8.  Dementia continue mirtazapine  9.  Hyperlipidemia continue Crestor  10.  Hyponatremia/hypokalemia. HCTZ on hold continue to encourage oral intake patient's mentation is improved and she is drinking more Monitor and replace potassium accordingly   Code Status: Full  Severity of Illness: The appropriate patient status for this patient is INPATIENT. Inpatient status is judged to be reasonable and necessary in order to provide the required intensity of service to ensure the patient's safety. The patient's presenting symptoms, physical exam findings, and initial radiographic and laboratory data in the context of their chronic comorbidities is felt to place them at high risk for further clinical deterioration. Furthermore, it is not anticipated that the patient will be  medically stable for discharge from the hospital within 2 midnights of admission. The following factors support the patient status of inpatient.  Patient admitted for metabolic encephalopathy and respiratory failure currently on BiPAP she is waiting for SNF.  * I certify that at the point of admission it is my clinical judgment that the patient  will require inpatient hospital care spanning beyond 2 midnights from the point of admission due to high intensity of service, high risk for further deterioration and high frequency of surveillance required.*   Family Communication:  daughter [name] at bedside.None at bedside  Disposition Plan: For SNF   Consultants: Palliative care Neurology  Procedures: None  Antimicrobials: None  DVT prophylaxis: Lovenox   Objective: Vitals:   04/20/21 0751 04/20/21 0838 04/20/21 1131 04/20/21 1600  BP: (!) 113/55  (!) 128/59 (!) 112/56  Pulse: 66  76 74  Resp: 18  18 20   Temp: 97.9 F (36.6 C)  98.2 F (36.8 C) 98.1 F (36.7 C)  TempSrc: Axillary  Oral Oral  SpO2: 99% 100% 97% 99%  Weight:      Height:        Intake/Output Summary (Last 24 hours) at 04/20/2021 1817 Last data filed at 04/20/2021 04/22/2021 Gross per 24 hour  Intake --  Output 400 ml  Net -400 ml    Filed Weights   04/03/21 0915 04/07/21 0527 04/12/21 0436  Weight: 64.4 kg 63.1 kg 60.4 kg   Body mass index is 24.35 kg/m.  Exam:  General: 87 y.o. year-old female well developed well nourished in no acute distress.  Frail looking elderly female.  Alert and oriented x1 Cardiovascular: Regular rate and rhythm with no rubs or gallops.  No thyromegaly or JVD noted.   Respiratory: Clear to auscultation with no wheezes or rales. Good inspiratory effort. Abdomen: Soft nontender nondistended with normal bowel sounds x4 quadrants. Musculoskeletal: No lower extremity edema. 2/4 pulses in all 4 extremities. Skin: No ulcerative lesions noted or rashes, Psychiatry: Mood is appropriate for condition and setting    Data Reviewed: CBC: Recent Labs  Lab 04/16/21 0000 04/17/21 0159  WBC 9.6 8.5  HGB 8.9* 9.5*  HCT 27.5* 29.5*  MCV 85.4 85.8  PLT 368 376    Basic Metabolic Panel: Recent Labs  Lab 04/16/21 0000 04/17/21 0159 04/18/21 0055 04/19/21 0952 04/20/21 0318  NA 130* 131* 130* 132* 134*  K 3.2* 3.3*  3.7 4.7 4.7  CL 93* 92* 94* 99 99  CO2 30 32 30 27 28   GLUCOSE 108* 104* 111* 100* 94  BUN 6* 8 7* 8 7*  CREATININE 0.93 0.98 1.00 0.92 0.94  CALCIUM 8.6* 8.6* 8.4* 8.4* 8.7*  MG  --   --  1.7  --   --     GFR: Estimated Creatinine Clearance: 32.7 mL/min (by C-G formula based on SCr of 0.94 mg/dL). Liver Function Tests: No results for input(s): AST, ALT, ALKPHOS, BILITOT, PROT, ALBUMIN in the last 168 hours. No results for input(s): LIPASE, AMYLASE in the last 168 hours. No results for input(s): AMMONIA in the last 168 hours. Coagulation Profile: No results for input(s): INR, PROTIME in the last 168 hours. Cardiac Enzymes: No results for input(s): CKTOTAL, CKMB, CKMBINDEX, TROPONINI in the last 168 hours. BNP (last 3 results) No results for input(s): PROBNP in the last 8760 hours. HbA1C: No results for input(s): HGBA1C in the last 72 hours. CBG: Recent Labs  Lab 04/19/21 1717 04/19/21 2127 04/20/21 0625 04/20/21 1137 04/20/21 1642  GLUCAP  111* 81 98 103* 107*    Lipid Profile: No results for input(s): CHOL, HDL, LDLCALC, TRIG, CHOLHDL, LDLDIRECT in the last 72 hours. Thyroid Function Tests: No results for input(s): TSH, T4TOTAL, FREET4, T3FREE, THYROIDAB in the last 72 hours. Anemia Panel: No results for input(s): VITAMINB12, FOLATE, FERRITIN, TIBC, IRON, RETICCTPCT in the last 72 hours. Urine analysis:    Component Value Date/Time   COLORURINE YELLOW 03/30/2021 2350   APPEARANCEUR CLEAR 03/30/2021 2350   LABSPEC 1.016 03/30/2021 2350   PHURINE 5.0 03/30/2021 2350   GLUCOSEU NEGATIVE 03/30/2021 2350   HGBUR SMALL (A) 03/30/2021 2350   BILIRUBINUR NEGATIVE 03/30/2021 2350   KETONESUR NEGATIVE 03/30/2021 2350   PROTEINUR NEGATIVE 03/30/2021 2350   NITRITE NEGATIVE 03/30/2021 2350   LEUKOCYTESUR NEGATIVE 03/30/2021 2350   Sepsis Labs: @LABRCNTIP (procalcitonin:4,lacticidven:4)  )No results found for this or any previous visit (from the past 240 hour(s)).     Studies: No results found.  Scheduled Meds:  acetaminophen  500 mg Oral Q8H   chlorhexidine  15 mL Mouth Rinse BID   diclofenac Sodium  2 g Topical QID   enoxaparin (LOVENOX) injection  30 mg Subcutaneous Q24H   feeding supplement  1 Container Oral Q24H   feeding supplement  237 mL Oral BID BM   ferrous sulfate  325 mg Oral Q breakfast   fluticasone furoate-vilanterol  1 puff Inhalation Daily   And   umeclidinium bromide  1 puff Inhalation Daily   guaiFENesin  5 mL Oral BID   hydrALAZINE  25 mg Oral Q8H   insulin aspart  0-9 Units Subcutaneous Q4H   latanoprost  1 drop Right Eye QHS   mouth rinse  15 mL Mouth Rinse q12n4p   metoprolol succinate  50 mg Oral Daily   mirtazapine  7.5 mg Oral QHS   multivitamin with minerals  1 tablet Oral Daily   pantoprazole  40 mg Oral Daily   rosuvastatin  20 mg Oral QHS   verapamil  240 mg Oral QHS    Continuous Infusions:     LOS: 20 days     , MD [facility]  To reach me or the doctor on call, go to: [email or web address] Password [facility]  04/20/2021, 6:17 PM

## 2021-04-20 NOTE — Progress Notes (Signed)
Spoke with Sheri [name] with [employer]- pt has been approved by her insurance for home NIV. If daughter decides to take pt home then [employer] can set pt up with NIV and RT support in the home for the home NIV needs.  TOC to continue to follow for transition needs/plan.

## 2021-04-20 NOTE — TOC Progression Note (Signed)
Transition of Care (TOC) - Progression Note  Sheri Pierini RN, BSN Transitions of Care Unit 4E- RN Case Manager See Treatment Team for direct phone #    Patient Details  Name: Sheri [name] MRN: [id number] Date of Birth: 1926-08-19  Transition of Care [facility]) CM/SW Contact  Sheri [name], Sheri Sink, RN Phone Number: 04/20/2021, 3:06 PM  Clinical Narrative:    Notified by bedside RN that family now thinking about taking patient home with [facility] and no longer want to pursue SNF rehab.  CM went to room to speak with daughter- Sheri [name], other daughter Sheri [name] also present. Confirmed with them that they no longer want SNF and want to take pt home with [facility]. Discussed HH and DME needs. Per Sheri [name] pt was active with [facility] services prior to admit- can not remember name of agency- on search in Patient Sheri [name]- noted patient appears to be active with [facility]. Family states they want to continue services if possible with [facility].   Discussed the pt has been approved for NIV at home with insurance- [employer] will contact Sheri [name] regarding delivery and set up of NIV for home with RT follow up regarding use of NIV. Sheri [name] also requesting [facility] and wheelchair for home. Will ask MD to place DME orders for these and have [employer] arrange these as well to deliver to the home with the NIV.  Per Sheri [name] pt has RW at home as well as home 02 that she was using at night ([employer] provided)  Address, phone # and PCP all confirmed in epic. Per Sheri [name] will plan to transport home via EMS.   Call made to [facility] with [employer] regarding plan for home, he will work on making arrangements for home NIV- per system check looks like pt had drop arm commode provided in 2020- they will speak with daughter regarding this. NIV delivery pending (possible home Tues/Wed)  Call made to [facility] with [facility] for [facility] needs- will confirm services PTA and get back for resumption of care needs. Orders placed for PT/OT.   TOC to follow up tomorrow     Expected  Discharge Plan: Skilled Nursing Facility Barriers to Discharge: Equipment Delay  Expected Discharge Plan and Services Expected Discharge Plan: Skilled Nursing Facility In-house Referral: Clinical Social Work Discharge Planning Services: CM Consult Post Acute Care Choice: Durable Medical Equipment, Home Health Living arrangements for the past 2 months: Single Family Home Expected Discharge Date: 04/09/21               DME Arranged: Bedside commode, Wheelchair manual, NIV DME Agency: Sheri [name] Date DME Agency Contacted: 04/20/21 Time DME Agency Contacted: 1505 Representative spoke with at DME Agency: Sheri [name] Agency: [facility] Date [facility] Agency Contacted: 04/20/21 Time HH Agency Contacted: 1505 Representative spoke with at [facility] Agency: Sheri [name]   Social Determinants of Health (SDOH) Interventions    Readmission Risk Interventions No flowsheet data found.

## 2021-04-20 NOTE — Progress Notes (Signed)
Physical Therapy Treatment Patient Details Name: Sheri [name] MRN: [id number] DOB: 08/08/1926 Today's Date: 04/20/2021    History of Present Illness Pt is a 87 year old woman admitted on 03/31/21 with AMS, COPD exacerbation  and falls. Pt injured her L ankle/foot and hit her head in a fall. Head CT negative, MRI no acute abnormality. Pt placed on Bipap at night. PMH: COPD on 3L at home, T11 compression fx, HTN, AAA.    PT Comments    Pt received in chair, agreeable to therapy session and with good participation as able in seated/standing activity. Pt BP soft in stance at RW 111/48 (68) and c/o feeling fatigued/weak so unable to weight shift for gait trial but able to stand x2 and pt with good tolerance for seated/supine AROM leg exercises. RN notified pt continues to c/o R thigh/foot pain only while weight bearing. Pt needs up to [facility] for transfers. Pt continues to benefit from PT services to progress toward functional mobility goals. Continue to recommend SNF.  Follow Up Recommendations  SNF;Supervision/Assistance - 24 hour     Equipment Recommendations  Other (comment);None recommended by PT (defer to post-acute)    Recommendations for Other Services       Precautions / Restrictions Precautions Precautions: Fall Precaution Comments: frequent falls at home (2-3x week); modified diet Restrictions Weight Bearing Restrictions: No    Mobility  Bed Mobility               General bed mobility comments: pt received in chair    Transfers Overall transfer level: Needs assistance Equipment used: Rolling walker (2 wheeled) Transfers: Sit to/from Stand Sit to Stand: Mod assist         General transfer comment: from chair to RW needing modA and multimodal cues, pt  Ambulation/Gait             General Gait Details: pt unable, quick to fatigue with shortness of breath after standing and weight shifting back/forth in front of chair x2 attempts   Stairs              Wheelchair Mobility    Modified Rankin (Stroke Patients Only)       Balance Overall balance assessment: Needs assistance Sitting-balance support: No upper extremity supported;Feet supported Sitting balance-Leahy Scale: Fair   Postural control: Posterior lean Standing balance support: Bilateral upper extremity supported Standing balance-Leahy Scale: Poor Standing balance comment: reliant on UE support in standing and noted retropulsion, able to remain upright  better on second attempt but tending to buckle/lose balance with attempt at weight shifting in stance.                            Cognition Arousal/Alertness: Awake/alert Behavior During Therapy: WFL for tasks assessed/performed Overall Cognitive Status: Impaired/Different from baseline Area of Impairment: Memory;Following commands;Safety/judgement;Awareness;Problem solving;Orientation;Attention                 Orientation Level: Disoriented to;Time;Situation Current Attention Level: Sustained Memory: Decreased short-term memory Following Commands: Follows one step commands with increased time Safety/Judgement: Decreased awareness of safety;Decreased awareness of deficits Awareness: Emergent Problem Solving: Slow processing;Difficulty sequencing;Requires verbal cues;Requires tactile cues General Comments: difficulty sequencing 2-step commands and remembering where to place hands when standing from chair but participatory as able, pt continues to report R hip pain      Exercises General Exercises - Lower Extremity Ankle Circles/Pumps: AROM;Both;20 reps;Seated Long Arc Quad: AROM;Both;Seated;20 reps Heel Slides: AAROM;Both;10 reps;Supine Hip  ABduction/ADduction: AAROM;Both;10 reps;Supine;AROM (mostly AROM needs tcs for technique) Hip Flexion/Marching: AROM;Both;Seated;20 reps Other Exercises Other Exercises: STS x 2 reps and standing tolerance ~3 mins within RW Other Exercises: Seated BUE AROM:  elbow flex/ext, UE "bicycle" with shoulders flexed ~90 deg    General Comments General comments (skin integrity, edema, etc.): BP 128/59 (80) seated in chair; BP 111/48 (68) standing at RW, pt c/o dizziness; SpO2 95% on 2L [place], HR 80's bpm with exertion      Pertinent Vitals/Pain Pain Assessment: Faces Faces Pain Scale: Hurts little more Pain Location: R hip with weight bearing (thigh/knee not red or hot to touch) Pain Descriptors / Indicators: Grimacing;Guarding Pain Intervention(s): Limited activity within patient's tolerance;Monitored during session;Repositioned (RN notified)    Home Living                      Prior Function            PT Goals (current goals can now be found in the care plan section) Acute Rehab PT Goals Patient Stated Goal: pt unable to state, daughter wants her to do therapy and get stronger so she can get back home soon. PT Goal Formulation: Patient unable to participate in goal setting Time For Goal Achievement: 04/29/21 Progress towards PT goals: Progressing toward goals    Frequency    Min 2X/week      PT Plan Current plan remains appropriate    Co-evaluation              AM-PAC PT "6 Clicks" Mobility   Outcome Measure  Help needed turning from your back to your side while in a flat bed without using bedrails?: A Little Help needed moving from lying on your back to sitting on the side of a flat bed without using bedrails?: A Lot Help needed moving to and from a bed to a chair (including a wheelchair)?: A Lot Help needed standing up from a chair using your arms (e.g., wheelchair or bedside chair)?: A Lot Help needed to walk in hospital room?: A Lot Help needed climbing 3-5 steps with a railing? : Total 6 Click Score: 12    End of Session Equipment Utilized During Treatment: Gait belt Activity Tolerance: Patient tolerated treatment well;Patient limited by fatigue Patient left: in chair;with call bell/phone within reach;with  chair alarm set;with family/visitor present;Other (comment) (heels floated, RN notified pt c/o RLE pain) Nurse Communication: Mobility status;Other (comment) (RLE pain and DBP low in stance) PT Visit Diagnosis: Unsteadiness on feet (R26.81);Other abnormalities of gait and mobility (R26.89);Repeated falls (R29.6);Muscle weakness (generalized) (M62.81);Difficulty in walking, not elsewhere classified (R26.2);History of falling (Z91.81);Pain Pain - Right/Left: Right Pain - part of body: Ankle and joints of foot;Hip     Time: 1212-1230 PT Time Calculation (min) (ACUTE ONLY): 18 min  Charges:  $Therapeutic Exercise: 8-22 mins                     Macarthur Lorusso P., PTA Acute Rehabilitation Services Pager: [phone] Office: [phone]    Angus Palms 04/20/2021, 1:37 PM

## 2021-04-21 DIAGNOSIS — J9622 Acute and chronic respiratory failure with hypercapnia: Secondary | ICD-10-CM | POA: Diagnosis not present

## 2021-04-21 LAB — BASIC METABOLIC PANEL
Anion gap: 6 (ref 5–15)
BUN: 9 mg/dL (ref 8–23)
CO2: 29 mmol/L (ref 22–32)
Calcium: 8.7 mg/dL — ABNORMAL LOW (ref 8.9–10.3)
Chloride: 99 mmol/L (ref 98–111)
Creatinine, Ser: 0.99 mg/dL (ref 0.44–1.00)
GFR, Estimated: 53 mL/min — ABNORMAL LOW (ref >60)
Glucose, Bld: 93 mg/dL (ref 70–99)
Potassium: 4.7 mmol/L (ref 3.5–5.1)
Sodium: 134 mmol/L — ABNORMAL LOW (ref 135–145)

## 2021-04-21 LAB — GLUCOSE, CAPILLARY
Glucose-Capillary: 95 mg/dL (ref 70–99)
Glucose-Capillary: 125 mg/dL — ABNORMAL HIGH (ref 70–99)

## 2021-04-21 MED ORDER — ALBUTEROL SULFATE (2.5 MG/3ML) 0.083% IN NEBU: 2.5000 mg | INHALATION_SOLUTION | Freq: Four times a day (QID) | RESPIRATORY_TRACT | 12 refills | Status: AC | PRN

## 2021-04-21 MED ORDER — CHLORHEXIDINE GLUCONATE 0.12 % MT SOLN: 15.0000 mL | Freq: Two times a day (BID) | OROMUCOSAL | 0 refills | Status: AC

## 2021-04-21 MED ORDER — DICLOFENAC SODIUM 1 % EX GEL: 2.0000 g | Freq: Four times a day (QID) | CUTANEOUS | 0 refills | Status: AC

## 2021-04-21 MED ORDER — ENSURE ENLIVE PO LIQD: 237.0000 mL | Freq: Two times a day (BID) | ORAL | 12 refills | Status: AC

## 2021-04-21 MED ORDER — GUAIFENESIN 100 MG/5ML PO SOLN: 5.0000 mL | Freq: Two times a day (BID) | ORAL | 0 refills | Status: AC | PRN

## 2021-04-21 NOTE — Care Management Important Message (Signed)
Important Message  Patient Details  Name: Sheri [name] MRN: [id number] Date of Birth: August 30, 1926   Medicare Important Message Given:  Yes     Antoniette Peake P Taesean Reth 04/21/2021, 3:18 PM

## 2021-04-21 NOTE — Progress Notes (Cosign Needed)
   Durable Medical Equipment (From admission, onward)        Start     Ordered  04/21/21 0928  DME standard manual wheelchair with seat cushion  Once      Comments: Bilateral knee arthritis patient suffers from bilateral knee arthritis which impairs their ability to perform daily activities like bathing or walking in the home.  A walker will not resolve issue with performing activities of daily living. A wheelchair will allow patient to safely perform daily activities. Patient can safely propel the wheelchair in the home or has a caregiver who can provide assistance. Length of need lifetime. Accessories: elevating leg rests (ELRs), wheel locks, extensions and anti-tippers.  04/21/21 0936  04/21/21 0927  DME 3-in-1  Once       04/21/21 0936  04/08/21 1546  For home use only DME Nebulizer machine  Once      Question Answer Comment Patient needs a nebulizer to treat with the following condition COPD (chronic obstructive pulmonary disease) (HCC)  Length of Need Lifetime    04/08/21 1547  04/08/21 1546  For home use only DME Nebulizer/meds  Once      Comments: Patient will need nebulizer solution for PRN use. Question Answer Comment Patient needs a nebulizer to treat with the following condition COPD (chronic obstructive pulmonary disease) (HCC)  Length of Need Lifetime    04/08/21 1547

## 2021-04-21 NOTE — TOC Transition Note (Signed)
Transition of Care (TOC) - CM/SW Discharge Note Donn Pierini RN, BSN Transitions of Care Unit 4E- RN Case Manager See Treatment Team for direct phone #    Patient Details  Name: Sheri [name] MRN: [id number] Date of Birth: 1926/01/07  Transition of Care [facility]) CM/SW Contact:  Darrold Span, RN Phone Number: 04/21/2021, 12:34 PM   Clinical Narrative:    Pt stable for transition home today, orders have been placed for [facility] and DME needs. Have spoken with Ian Malkin with [employer] and they will coordinate going to home later today to deliver NIV and have RT educate family on use once pt is home. [employer] also to contact daughter regarding [facility] and wheelchair needs/delivery.   Call made to daughter to let her know about discharge today, she states she will be here around noon, and will plan to call transport when she gets here.   Call made to [name] with [facility]- pt was active with them for PT services prior to admit, confirmed they can resume services for discharge.   1230- daughter has arrived to bedside, [facility] has been called for transport, per scheduler they will be here "shortly" as no one is ahead of patient on list. Paperwork placed on shadow chart for transport.   [employer] called to update on timing of transport home so that they can call daughter to schedule time for NIV delivery this afternoon. Pt already has home 02 at home.     Final next level of care: Home w Home Health Services Barriers to Discharge: Barriers Resolved   Patient Goals and CMS Choice Patient states their goals for this hospitalization and ongoing recovery are:: return home CMS Medicare.gov Compare Post Acute Care list provided to:: Patient Represenative (must comment) (daughters) Choice offered to / list presented to : Adult Children  Discharge Placement                 Home with [facility]      Discharge Plan and Services In-house Referral: Clinical Social Work Discharge Planning Services: CM Consult Post Acute  Care Choice: Durable Medical Equipment, Home Health          DME Arranged: Bedside commode, Wheelchair manual, NIV DME Agency: [employer] Date DME Agency Contacted: 04/20/21 Time DME Agency Contacted: 1505 Representative spoke with at DME Agency: Ian Malkin HH Arranged: PT, Nurse's Aide HH Agency: [facility] Date [facility] Agency Contacted: 04/20/21 Time HH Agency Contacted: 1505 Representative spoke with at [facility] Agency: Misty Stanley  Social Determinants of Health (SDOH) Interventions     Readmission Risk Interventions Readmission Risk Prevention Plan 04/21/2021  Transportation Screening Complete  PCP or Specialist Appt within 5-7 Days Complete  Home Care Screening Complete  Medication Review (RN CM) Complete  Some recent data might be hidden

## 2021-04-21 NOTE — Progress Notes (Signed)
Pt d/c'd from 4E via [facility]. IV and telemetry removed. Discharge instructions and medication education completed. Daughter took pts nebulizer and bipap mask with her as patient was being discharged to daughters house.

## 2021-04-21 NOTE — Discharge Summary (Signed)
Discharge Summary  DELEAH TISON WUJ:[id number] DOB: Sep 01, 1926  PCP: Renaye Rakers, MD  Admit date: 03/30/2021 Discharge date: 04/21/2021  Time spent: 32 minutes  Recommendations for Outpatient Follow-up:  PCP  Discharge Diagnoses:  Active Hospital Problems   Diagnosis Date Noted   Acute metabolic encephalopathy 03/31/2021   Esophageal dysphagia    Closed wedge compression fracture of T11 vertebra (HCC)    Goals of care, counseling/discussion    Acute on chronic respiratory failure with hypercapnia (HCC) 03/31/2021   COPD exacerbation (HCC) 03/31/2021   Chronic respiratory failure with hypoxia (HCC) 03/09/2018   Chronic obstructive pulmonary disease (HCC) 01/04/2015   Essential hypertension 01/04/2015    Resolved Hospital Problems  No resolved problems to display.    Discharge Condition: IMPROVED  Diet recommendation: REGULAR/SOFT  Vitals:   04/21/21 0730 04/21/21 0744  BP: (!) 117/56   Pulse: 63   Resp: 16   Temp: 98.1 F (36.7 C)   SpO2: 100% 100%    History of present illness:  This is a 42 [name] 87 year old female admitted Mar 31, 2021 with altered mental status COPD exacerbation falls.  Chronic respiratory failure on 3 L/min oxygen at home history of T11 compression fracture hypertension and AAA patient sustained injury to her left ankle and foot as well as head trauma.  Head CT however was negative.  Hospital Course:  Principal Problem:   Acute metabolic encephalopathy Active Problems:   Chronic obstructive pulmonary disease (HCC)   Essential hypertension   Chronic respiratory failure with hypoxia (HCC)   Acute on chronic respiratory failure with hypercapnia (HCC)   COPD exacerbation (HCC)   Closed wedge compression fracture of T11 vertebra (HCC)   Goals of care, counseling/discussion   Esophageal dysphagia  This is a 69 [name] 87 year old female admitted Mar 31, 2021 with altered mental status COPD exacerbation falls.  Chronic respiratory failure on  3 L/min oxygen at home history of T11 compression fracture hypertension and AAA patient sustained injury to her left ankle and foot as well as head trauma.  Head CT however was negative.   1.  Metabolic encephalopathy improving patient is more awake and eating.  She was started on BiPAP in the ED because of hypercapnia  2.  Latent syphilis positive RPR.  Incidental finding of positive RPR and she has been started on IM penicillin every week.  She has completed her treatment  3.  Patient had a relapse of her cognition recently after initial improvement was not certain the cause of it with it was from Haldol effect hypercarbia or neurosyphilis neurology was therefore consulted as well as palliative care. There is some improvement we will continue BiPAP at night avoid sedative continue delirium protocol  4.  COPD with chronic hypoxic respiratory failure on 3 L/min home O2 Continue BiPAP at night Continue ICS/LABA/LAMA She has been approved for home NIV for her BiPAP at night.  And this will be set up prior to discharge   5.  None insulin-dependent diabetes mellitus. Due to initial poor p.o. intake she was started on D5 half-normal saline. Linagliptin has been on hold We will continue insulin coverage  6.  Chronic kidney disease stage III A Creatinine is at baseline  7.  Normocytic anemia likely from chronic kidney disease continue to to monitor  8.  Dementia continue mirtazapine  9.  Hyperlipidemia continue Crestor  10.  Hyponatremia/hypokalemia. HCTZ on hold continue to encourage oral intake patient's mentation is improved and she is drinking more Monitor and replace  potassium accordingly  11.  5.3 cm aortic abdominal aneurysm.  Patient is to follow-up in 12 months  12.  Heterogeneous thyroid gland in the setting of comorbidities and life expectancy no follow-up recommended  13.  T11 compression fracture that she had since December 2021 To have noticed increased height loss up to  50%.  Nothing to do    Procedures: None  Consultations: Neurology  Palliative care   Discharge Exam: BP (!) 117/56 (BP Location: Left Arm)   Pulse 63   Temp 98.1 F (36.7 C) (Oral)   Resp 16   Ht 5\' 2"  (1.575 m)   Wt 60.4 kg   SpO2 100%   BMI 24.35 kg/m   General: Patient seen and examined at bedside she is pleasant answering questions Cardiovascular: Regular rate and rhythm no murmur trace edema Respiratory: Clear to auscultation bilaterally  Discharge Instructions You were cared for by a hospitalist during your hospital stay. If you have any questions about your discharge medications or the care you received while you were in the hospital after you are discharged, you can call the unit and asked to speak with the hospitalist on call if the hospitalist that took care of you is not available. Once you are discharged, your primary care physician will handle any further medical issues. Please note that NO REFILLS for any discharge medications will be authorized once you are discharged, as it is imperative that you return to your primary care physician (or establish a relationship with a primary care physician if you do not have one) for your aftercare needs so that they can reassess your need for medications and monitor your lab values.  Discharge Instructions     Call MD for:  difficulty breathing, headache or visual disturbances   Complete by: As directed    Call MD for:  extreme fatigue   Complete by: As directed    Call MD for:  severe uncontrolled pain   Complete by: As directed    Call MD for:  severe uncontrolled pain   Complete by: As directed    Call MD for:  temperature >100.4   Complete by: As directed    Diet - low sodium heart healthy   Complete by: As directed    Diet general   Complete by: As directed    Discharge instructions   Complete by: As directed    It has been a pleasure taking care of you!  You were hospitalized due to confusion/altered mental  status likely from acute COPD exacerbation.  Your symptoms improved after treatment of the COPD and adjustment of your oxygen.  It is very important that you use minimum oxygen to keep your oxygen saturation above 88%.  You may not need oxygen when you are at rest.  Please review your new medication list and the directions on your medications before you take them.  Please follow-up with your primary care doctor in 1 to 2 weeks.  We also recommend follow-up with lung doctor or pulmonologist in about 2 weeks or ask for referral if you don't have one.    Take care,   Discharge instructions   Complete by: As directed    Follow-up with primary care provider.  In 1 to 2 weeks   Increase activity slowly   Complete by: As directed    Increase activity slowly   Complete by: As directed       Allergies as of 04/21/2021       Reactions  Haldol [haloperidol] Other (See Comments)   Prolonged sedation/confusion   Arlice Colt Isothiocyanate] Hives        Medication List     STOP taking these medications    amLODipine 5 MG tablet Commonly known as: NORVASC   OXYGEN   Symbicort 160-4.5 MCG/ACT inhaler Generic drug: budesonide-formoterol   Tiotropium Bromide Monohydrate 1.25 MCG/ACT Aers Commonly known as: Spiriva Respimat       TAKE these medications    albuterol (2.5 MG/3ML) 0.083% nebulizer solution Commonly known as: PROVENTIL Take 3 mLs (2.5 mg total) by nebulization every 6 (six) hours as needed for shortness of breath or wheezing.   Breztri Aerosphere 160-9-4.8 MCG/ACT Aero Generic drug: Budeson-Glycopyrrol-Formoterol Inhale 1 puff into the lungs at bedtime.   chlorhexidine 0.12 % solution Commonly known as: PERIDEX 15 mLs by Mouth Rinse route 2 (two) times daily.   Combivent Respimat 20-100 MCG/ACT Aers respimat Generic drug: Ipratropium-Albuterol Inhale 1 puff into the lungs every 6 (six) hours as needed for wheezing or shortness of breath. What changed:  when  to take this reasons to take this   diclofenac Sodium 1 % Gel Commonly known as: VOLTAREN Apply 2 g topically 4 (four) times daily. To both knees  for pain What changed:  how much to take additional instructions   feeding supplement Liqd Take 237 mLs by mouth 2 (two) times daily between meals.   ferrous sulfate 325 (65 FE) MG tablet Take 325 mg by mouth daily with breakfast.   gabapentin 100 MG capsule Commonly known as: NEURONTIN Take 100 mg by mouth 2 (two) times a day.   guaiFENesin 100 MG/5ML Soln Commonly known as: ROBITUSSIN Take 5 mLs (100 mg total) by mouth 2 (two) times daily as needed for cough or to loosen phlegm.   hydrochlorothiazide 12.5 MG capsule Commonly known as: MICROZIDE TAKE 1 CAPSULE BY MOUTH EVERY DAY What changed: how much to take   Januvia 100 MG tablet Generic drug: sitaGLIPtin Take 100 mg by mouth daily.   latanoprost 0.005 % ophthalmic solution Commonly known as: XALATAN Place 1 drop into the right eye at bedtime.   metoprolol succinate 50 MG 24 hr tablet Commonly known as: TOPROL-XL TAKE 1 TABLET (50 MG TOTAL) BY MOUTH DAILY. TAKE WITH OR IMMEDIATELY FOLLOWING A MEAL. What changed: additional instructions   mirtazapine 7.5 MG tablet Commonly known as: REMERON Take 7.5 mg by mouth at bedtime.   pantoprazole 40 MG tablet Commonly known as: PROTONIX Take 40 mg by mouth daily.   rosuvastatin 20 MG tablet Commonly known as: CRESTOR Take 20 mg by mouth at bedtime.   verapamil 240 MG CR tablet Commonly known as: CALAN-SR TAKE 1 TABLET BY MOUTH AT BEDTIME.   Vitamin D (Ergocalciferol) 1.25 MG (50000 UNIT) Caps capsule Commonly known as: DRISDOL Take 50,000 Units by mouth every Monday.               Durable Medical Equipment  (From admission, onward)           Start     Ordered   04/21/21 0928  DME standard manual wheelchair with seat cushion  Once       Comments: Bilateral knee arthritis patient suffers from bilateral  knee arthritis which impairs their ability to perform daily activities like bathing or walking in the home.  A walker will not resolve issue with performing activities of daily living. A wheelchair will allow patient to safely perform daily activities. Patient can safely propel the wheelchair in  the home or has a caregiver who can provide assistance. Length of need lifetime. Accessories: elevating leg rests (ELRs), wheel locks, extensions and anti-tippers.   04/21/21 0936   04/21/21 0927  DME 3-in-1  Once        04/21/21 0936   04/08/21 1546  For home use only DME Nebulizer machine  Once       Question Answer Comment  Patient needs a nebulizer to treat with the following condition COPD (chronic obstructive pulmonary disease) (HCC)   Length of Need Lifetime      04/08/21 1547   04/08/21 1546  For home use only DME Nebulizer/meds  Once       Comments: Patient will need nebulizer solution for PRN use.  Question Answer Comment  Patient needs a nebulizer to treat with the following condition COPD (chronic obstructive pulmonary disease) (HCC)   Length of Need Lifetime      04/08/21 1547           Allergies  Allergen Reactions   Haldol [Haloperidol] Other (See Comments)    Prolonged sedation/confusion   Arlice Colt Isothiocyanate] Hives    Follow-up Information     Renaye Rakers, MD. Schedule an appointment as soon as possible for a visit in 1 week(s).   Specialty: Family Medicine Contact information: [address] [place] [address] [phone]         [facility] Follow up.   Why: NIV arranged for home (insurance has approved) to be delivered to the home. Contact information: [address] [place] [address] [phone]         Jobe Gibbon, [facility] Follow up.   Specialty: Home Health Services Contact information: [address] [place] [address] [place] [address] [phone]                  The results of  significant diagnostics from this hospitalization (including imaging, microbiology, ancillary and laboratory) are listed below for reference.    Significant Diagnostic Studies: DG Thoracic Spine 2 View  Result Date: 03/30/2021 CLINICAL DATA:  Recent fall several days ago with back pain, initial encounter EXAM: THORACIC SPINE 2 VIEWS COMPARISON:  CT from 10/28/2020 FINDINGS: T11 compression fracture is noted with some slight increased height loss when compared with the prior CT. Mild osteophytic changes are seen. No paraspinal mass lesion is noted. No pedicle abnormality is seen. IMPRESSION: T11 compression fracture with some increase in vertebral body height loss when compared with the prior study now at approximately 50% vertebral body height anteriorly. Electronically Signed   By: Alcide Clever M.D.   On: 03/30/2021 21:46   DG Lumbar Spine Complete  Result Date: 03/30/2021 CLINICAL DATA:  Low back pain following fall several days ago, initial encounter EXAM: LUMBAR SPINE - COMPLETE 4+ VIEW COMPARISON:  10/28/2020 CT FINDINGS: Five lumbar type vertebral bodies are well visualized. No pars defects are noted. No anterolisthesis is noted. Multilevel osteophytic changes are seen. T11 compression fracture is again noted. Disc space narrowing at L4-5 and to a lesser degree at L5-S1 is seen. Facet hypertrophic changes are noted. Abdominal aortic calcifications are seen with mild dilatation to approximately 4.3 cm. IMPRESSION: Multilevel degenerative change without acute abnormality. Dilatation of the infrarenal aorta to 4.3 cm. Recommend follow-up every 12 months and vascular consultation. This recommendation follows ACR consensus guidelines: White Paper of the ACR Incidental Findings Committee II on Vascular Findings. J Am Coll Radiol 2013; 10:789-794. Electronically Signed  By: Alcide Clever M.D.   On: 03/30/2021 21:47   DG Ankle 2 Views Left  Result Date: 03/30/2021 CLINICAL DATA:  Left ankle pain following  fall several days ago, initial encounter EXAM: LEFT ANKLE - 2 VIEW COMPARISON:  None. FINDINGS: No acute fracture or dislocation is noted. Mild soft tissue swelling is seen. IMPRESSION: Soft tissue swelling without acute bony abnormality. Electronically Signed   By: Alcide Clever M.D.   On: 03/30/2021 21:44   CT HEAD WO CONTRAST  Result Date: 03/30/2021 CLINICAL DATA:  Recent fall 3 days ago with headaches and neck pain, initial encounter EXAM: CT HEAD WITHOUT CONTRAST CT CERVICAL SPINE WITHOUT CONTRAST TECHNIQUE: Multidetector CT imaging of the head and cervical spine was performed following the standard protocol without intravenous contrast. Multiplanar CT image reconstructions of the cervical spine were also generated. COMPARISON:  10/28/2020 FINDINGS: CT HEAD FINDINGS Brain: No evidence of acute infarction, hemorrhage, hydrocephalus, extra-axial collection or mass lesion/mass effect. Mild atrophic changes and chronic white matter ischemic changes are noted. Vascular: No hyperdense vessel or unexpected calcification. Skull: Stable sclerotic focus is noted in the left frontal bone similar to that seen on prior exams. Sinuses/Orbits: No acute finding. Other: Small left frontal scalp lipoma is noted. CT CERVICAL SPINE FINDINGS Alignment: Within normal limits. Skull base and vertebrae: 7 cervical segments are well visualized. Vertebral body height is well maintained. Multilevel osteophytic changes are seen. Mild facet hypertrophic changes are noted. No acute fracture or acute facet abnormality is noted. Soft tissues and spinal canal: Soft tissue calcifications are noted in the region of the submandibular gland on the right consistent with some sialoliths. Largest of these measures 11 mm. Heterogeneity of the thyroid is noted. No discrete nodule is noted. Upper chest: Lung apices demonstrate emphysematous change. Other: None IMPRESSION: CT of the head: Chronic atrophic and ischemic changes without acute  abnormality. CT of the cervical spine: Multilevel degenerative change without acute bony abnormality. Right submandibular sialoliths. Heterogeneity of the thyroid gland. In the setting of significant comorbidities or limited life expectancy, no follow-up recommended (ref: J Am Coll Radiol. 2015 Feb;12(2): 143-50). Electronically Signed   By: Alcide Clever M.D.   On: 03/30/2021 21:38   CT CERVICAL SPINE WO CONTRAST  Result Date: 03/30/2021 CLINICAL DATA:  Recent fall 3 days ago with headaches and neck pain, initial encounter EXAM: CT HEAD WITHOUT CONTRAST CT CERVICAL SPINE WITHOUT CONTRAST TECHNIQUE: Multidetector CT imaging of the head and cervical spine was performed following the standard protocol without intravenous contrast. Multiplanar CT image reconstructions of the cervical spine were also generated. COMPARISON:  10/28/2020 FINDINGS: CT HEAD FINDINGS Brain: No evidence of acute infarction, hemorrhage, hydrocephalus, extra-axial collection or mass lesion/mass effect. Mild atrophic changes and chronic white matter ischemic changes are noted. Vascular: No hyperdense vessel or unexpected calcification. Skull: Stable sclerotic focus is noted in the left frontal bone similar to that seen on prior exams. Sinuses/Orbits: No acute finding. Other: Small left frontal scalp lipoma is noted. CT CERVICAL SPINE FINDINGS Alignment: Within normal limits. Skull base and vertebrae: 7 cervical segments are well visualized. Vertebral body height is well maintained. Multilevel osteophytic changes are seen. Mild facet hypertrophic changes are noted. No acute fracture or acute facet abnormality is noted. Soft tissues and spinal canal: Soft tissue calcifications are noted in the region of the submandibular gland on the right consistent with some sialoliths. Largest of these measures 11 mm. Heterogeneity of the thyroid is noted. No discrete nodule is noted. Upper chest: Lung  apices demonstrate emphysematous change. Other: None  IMPRESSION: CT of the head: Chronic atrophic and ischemic changes without acute abnormality. CT of the cervical spine: Multilevel degenerative change without acute bony abnormality. Right submandibular sialoliths. Heterogeneity of the thyroid gland. In the setting of significant comorbidities or limited life expectancy, no follow-up recommended (ref: J Am Coll Radiol. 2015 Feb;12(2): 143-50). Electronically Signed   By: Alcide CleverMark  Lukens M.D.   On: 03/30/2021 21:38   MR ANGIO HEAD WO CONTRAST  Result Date: 04/09/2021 CLINICAL DATA:  Possible abnormality of vertebrobasilar arteries on MRI brain EXAM: MRA HEAD WITHOUT CONTRAST MRA NECK WITHOUT CONTRAST TECHNIQUE: Angiographic images of the Circle of Willis were obtained using MRA technique without intravenous contrast. Angiographic images of the neck were obtained using MRA technique without intravenous contrast. Carotid stenosis measurements (when applicable) are obtained utilizing NASCET criteria, using the distal internal carotid diameter as the denominator. COMPARISON:  None. FINDINGS: Motion artifact is present. MRA HEAD Intracranial internal carotid arteries are patent with atherosclerotic irregularity. Prox middle and anterior cerebral arteries are patent. There is flow related enhancement of the right greater than left vertebral arteries. Left vertebral artery may terminate as a PICA. Partial visualization the basilar artery. Bilateral posterior communicating arteries are present and appear to be the primary supply of patent proximal posterior cerebral arteries. MRA NECK Common, internal, and external carotid arteries are patent. Poor evaluation of the proximal internal carotids due to artifact with no apparent high-grade stenosis. Extracranial dominant right vertebral artery is patent. There is partial visualization of the left vertebral artery. IMPRESSION: No definite hemodynamically significant stenosis in the neck. Partial visualization of non dominant left  vertebral artery may be due to small caliber in the setting of artifact. Partial visualization of the vertebrobasilar arteries intracranially is probably a function of small caliber in the setting of artifact. There are bilateral posterior communicating arteries that appear to be the primary supply of the posterior cerebral arteries and this would account for a smaller vertebrobasilar system. Electronically Signed   By: Guadlupe SpanishPraneil  Patel M.D.   On: 04/09/2021 11:05   MR ANGIO NECK WO CONTRAST  Result Date: 04/09/2021 CLINICAL DATA:  Possible abnormality of vertebrobasilar arteries on MRI brain EXAM: MRA HEAD WITHOUT CONTRAST MRA NECK WITHOUT CONTRAST TECHNIQUE: Angiographic images of the Circle of Willis were obtained using MRA technique without intravenous contrast. Angiographic images of the neck were obtained using MRA technique without intravenous contrast. Carotid stenosis measurements (when applicable) are obtained utilizing NASCET criteria, using the distal internal carotid diameter as the denominator. COMPARISON:  None. FINDINGS: Motion artifact is present. MRA HEAD Intracranial internal carotid arteries are patent with atherosclerotic irregularity. Prox middle and anterior cerebral arteries are patent. There is flow related enhancement of the right greater than left vertebral arteries. Left vertebral artery may terminate as a PICA. Partial visualization the basilar artery. Bilateral posterior communicating arteries are present and appear to be the primary supply of patent proximal posterior cerebral arteries. MRA NECK Common, internal, and external carotid arteries are patent. Poor evaluation of the proximal internal carotids due to artifact with no apparent high-grade stenosis. Extracranial dominant right vertebral artery is patent. There is partial visualization of the left vertebral artery. IMPRESSION: No definite hemodynamically significant stenosis in the neck. Partial visualization of non dominant  left vertebral artery may be due to small caliber in the setting of artifact. Partial visualization of the vertebrobasilar arteries intracranially is probably a function of small caliber in the setting of artifact. There are bilateral posterior  communicating arteries that appear to be the primary supply of the posterior cerebral arteries and this would account for a smaller vertebrobasilar system. Electronically Signed   By: Guadlupe Spanish M.D.   On: 04/09/2021 11:05   MR BRAIN WO CONTRAST  Result Date: 04/16/2021 CLINICAL DATA:  Acute neuro deficit. Rule out stroke. Altered mental status. The EXAM: MRI HEAD WITHOUT CONTRAST TECHNIQUE: Multiplanar, multiecho pulse sequences of the brain and surrounding structures were obtained without intravenous contrast. COMPARISON:  MRI head 04/08/2021 FINDINGS: Brain: Image quality degraded by motion Negative for acute infarct. Moderate white matter changes bilaterally due to chronic microvascular ischemia. No interval change. Mild hyperintensity in the pons. Negative for hemorrhage, mass, or fluid collection. Vascular: Normal arterial flow voids.  Hypoplastic basilar. Skull and upper cervical spine: Negative Sinuses/Orbits: Paranasal sinuses clear. Mild mastoid effusion on the right. Right cataract extraction Other: None IMPRESSION: No acute abnormality no change from the recent MRI. Moderate white matter changes consistent with chronic microvascular ischemia. Electronically Signed   By: Marlan Palau M.D.   On: 04/16/2021 13:41   MR BRAIN WO CONTRAST  Result Date: 04/08/2021 CLINICAL DATA:  Stroke suspected. EXAM: MRI HEAD WITHOUT CONTRAST TECHNIQUE: Multiplanar, multiecho pulse sequences of the brain and surrounding structures were obtained without intravenous contrast. COMPARISON:  Head CT Mar 03, 2021. FINDINGS: Brain: No acute infarction, hemorrhage, hydrocephalus, extra-axial collection or mass lesion. Remote lacunar infarct within the left side of the pons.  Scattered confluent foci of T2 hyperintensity are seen within the white matter of the cerebral hemispheres and within the pons, nonspecific, most likely related to chronic small vessel ischemia. Developmental venous anomaly in the left corona radiata with surrounding T2 hyperintensity. Vascular: Normal flow void in the major arteries of the anterior circulation. Very diminutive vertebrobasilar system may be related to hypoplasia and/or stenosis. Skull and upper cervical spine: Normal marrow signal. Sinuses/Orbits: Right lens surgery. Opacification of the left frontal sinus. Other: Right mastoid effusion. IMPRESSION: 1. No acute intracranial abnormality. 2. Moderate chronic microvascular ischemic changes of the white matter. 3. Remote lacunar infarct within the left side of the pons. 4. Diminutive vertebrobasilar system, may be related to hypoplasia although stenosis cannot be excluded. Correlation with MR angiogram or CT angiogram suggested. Electronically Signed   By: Baldemar Lenis M.D.   On: 04/08/2021 13:14   DG CHEST PORT 1 VIEW  Result Date: 04/16/2021 CLINICAL DATA:  Cough, altered mental status EXAM: PORTABLE CHEST 1 VIEW COMPARISON:  Portable exam 0952 hours compared to 04/12/2021 FINDINGS: Upper normal heart size. Mediastinal contours and pulmonary vascularity normal. Calcified tortuous thoracic aorta. Bibasilar atelectasis versus infiltrate. Tiny bibasilar effusions. Remaining lungs clear. No pneumothorax or acute osseous findings. IMPRESSION: Bibasilar atelectasis. Aortic Atherosclerosis (ICD10-I70.0). Electronically Signed   By: Ulyses Southward M.D.   On: 04/16/2021 11:19   DG Chest Port 1 View  Result Date: 04/12/2021 CLINICAL DATA:  Shortness of breath. EXAM: PORTABLE CHEST 1 VIEW COMPARISON:  04/06/2021, 04/17/2019 FINDINGS: Patient slightly rotated to the right. Lungs are hyperexpanded without focal airspace consolidation, effusion or pneumothorax. Cardiomediastinal silhouette and  remainder of the exam is unchanged. IMPRESSION: No acute disease. Electronically Signed   By: Elberta Fortis M.D.   On: 04/12/2021 11:59   DG CHEST PORT 1 VIEW  Result Date: 04/06/2021 CLINICAL DATA:  Altered mental status, respiratory distress EXAM: PORTABLE CHEST 1 VIEW COMPARISON:  03/30/2021 FINDINGS: The lungs are mildly hyperinflated, similar to that noted on prior examination, in keeping with changes of  underlying COPD. The lungs are clear. No pneumothorax or pleural effusion. Cardiac size is mildly enlarged, unchanged. Pulmonary vascularity is normal. IMPRESSION: No active disease.  COPD Electronically Signed   By: Helyn Numbers MD   On: 04/06/2021 22:40   DG Chest Portable 1 View  Result Date: 03/30/2021 CLINICAL DATA:  Weakness EXAM: PORTABLE CHEST 1 VIEW COMPARISON:  02/11/2020 FINDINGS: Cardiac shadow is mildly enlarged but stable. Aortic calcifications are noted. Aorta is tortuous but stable. The lungs are clear bilaterally. No bony abnormality is seen. No bony abnormality is noted. IMPRESSION: No active disease. Electronically Signed   By: Alcide Clever M.D.   On: 03/30/2021 21:40   DG Foot Complete Left  Result Date: 03/30/2021 CLINICAL DATA:  Recent fall several days ago with ankle pain and foot pain, initial encounter EXAM: LEFT FOOT - COMPLETE 3+ VIEW COMPARISON:  06/15/16 FINDINGS: No acute fracture or dislocation is noted. No soft tissue abnormality is seen. Vascular calcifications are noted. IMPRESSION: No acute abnormality noted. Electronically Signed   By: Alcide Clever M.D.   On: 03/30/2021 21:39   DG Swallowing Func-Speech Pathology  Result Date: 04/08/2021 Objective Swallowing Evaluation: Type of Study: MBS-Modified Barium Swallow Study  Patient Details Name: NIMSI MALES MRN: [id number] Date of Birth: August 05, 1926 Today's Date: 04/08/2021 Time: SLP Start Time (ACUTE ONLY): 1320 -SLP Stop Time (ACUTE ONLY): 1335 SLP Time Calculation (min) (ACUTE ONLY): 15 min Past Medical History:  Past Medical History: Diagnosis Date  Bronchitis   COPD (chronic obstructive pulmonary disease) (HCC)   Hypertension  Past Surgical History: Past Surgical History: Procedure Laterality Date  EYE SURGERY    TUBAL LIGATION   HPI: Pt is a 87 year old woman admitted on 03/31/21 with AMS, COPD exacerbation  and falls. Pt injured her L ankle/foot and hit her head in a fall. Head CT negative, CXR on admission negative. Esophageal dysphagia reported by pt's daughter to referring MD. Esophagram 08/06/19: Esophageal dysmotility, likely presbyesophagus. Small hiatal hernia with mild stricture as evidenced by underdistention at the gastroesophageal junction. PMH: COPD on 3L at home, t11 compression fx, HTN, AAA.  Subjective: alert, upright in chair for procedure Assessment / Plan / Recommendation CHL IP CLINICAL IMPRESSIONS 04/08/2021 Clinical Impression Pt presents with mild oral, mild to moderate pharyngeal, and a component of esphageal dysphagia. Pt has dentures at baseline but did not utilize this date, stating need for denture adhesive and that daughter is to bring in. Pt with reduced bolus cohesion with solids, delayed oral transit with POs, and mild oral residuals. Pt with delay in swallow initiation with all liquids at the level of the pyriform sinsus, reduced laryngeal vestibule closure with incomplete epiglottic deflection, decreased pharyngeal constriction, diminished sensation, and reduced UES distention. Cervical osteophytes noted that likely also impacted cervical esophageal patency. Pt with laryngeal penetration of thin (PAS-2,3), nectar thick (PAS-3,5) and laryngeal penetration with post swallow silent aspiration with honey thick liquids (3,5,8). Pt unable to implement cued cough despite SLP instruction due to cognitive deficits. Pt with barium tablet stasis, brief in nature in valleculae, then cricopharyngeal region (reflexive swallow did propel tablet to esophagus). During esopahgeal sweep pt with barium retention  and tablet slowly traversed to the level of GE junction. Pt with hx of presbyesophagus and mild stricture per prior workup. Recommend dysphagia 3 (mechanical soft) and thin liquids with meds crushed in puree. SLP to follow up. SLP Visit Diagnosis Dysphagia, oropharyngeal phase (R13.12);Dysphagia, pharyngoesophageal phase (R13.14) Attention and concentration deficit following -- Frontal lobe  and executive function deficit following -- Impact on safety and function Mild aspiration risk;Moderate aspiration risk   CHL IP TREATMENT RECOMMENDATION 04/08/2021 Treatment Recommendations Therapy as outlined in treatment plan below   Prognosis 04/08/2021 Prognosis for Safe Diet Advancement Fair Barriers to Reach Goals Cognitive deficits;Time post onset Barriers/Prognosis Comment -- CHL IP DIET RECOMMENDATION 04/08/2021 SLP Diet Recommendations Dysphagia 3 (Mech soft) solids;Thin liquid Liquid Administration via Cup;Straw Medication Administration Crushed with puree Compensations Minimize environmental distractions;Slow rate;Small sips/bites;Clear throat intermittently Postural Changes Remain semi-upright after after feeds/meals (Comment);Seated upright at 90 degrees   CHL IP OTHER RECOMMENDATIONS 04/08/2021 Recommended Consults -- Oral Care Recommendations Oral care BID Other Recommendations --   CHL IP FOLLOW UP RECOMMENDATIONS 04/08/2021 Follow up Recommendations 24 hour supervision/assistance   CHL IP FREQUENCY AND DURATION 04/08/2021 Speech Therapy Frequency (ACUTE ONLY) min 2x/week Treatment Duration 2 weeks      CHL IP ORAL PHASE 04/08/2021 Oral Phase Impaired Oral - Pudding Teaspoon -- Oral - Pudding Cup -- Oral - Honey Teaspoon -- Oral - Honey Cup Delayed oral transit;Decreased bolus cohesion Oral - Nectar Teaspoon -- Oral - Nectar Cup Delayed oral transit Oral - Nectar Straw -- Oral - Thin Teaspoon -- Oral - Thin Cup Decreased bolus cohesion;Lingual/palatal residue Oral - Thin Straw Lingual/palatal residue;Decreased bolus cohesion  Oral - Puree Decreased bolus cohesion;Delayed oral transit;Lingual/palatal residue Oral - Mech Soft Lingual/palatal residue;Delayed oral transit;Decreased bolus cohesion Oral - Regular -- Oral - Multi-Consistency -- Oral - Pill Delayed oral transit Oral Phase - Comment --  CHL IP PHARYNGEAL PHASE 04/08/2021 Pharyngeal Phase Impaired Pharyngeal- Pudding Teaspoon -- Pharyngeal -- Pharyngeal- Pudding Cup -- Pharyngeal -- Pharyngeal- Honey Teaspoon -- Pharyngeal -- Pharyngeal- Honey Cup Reduced epiglottic inversion;Reduced tongue base retraction;Penetration/Aspiration during swallow;Reduced airway/laryngeal closure;Pharyngeal residue - valleculae;Pharyngeal residue - pyriform Pharyngeal Material enters airway, remains ABOVE vocal cords and not ejected out;Material enters airway, CONTACTS cords and not ejected out;Material enters airway, passes BELOW cords without attempt by patient to eject out (silent aspiration) Pharyngeal- Nectar Teaspoon -- Pharyngeal -- Pharyngeal- Nectar Cup Delayed swallow initiation-pyriform sinuses;Reduced epiglottic inversion;Reduced airway/laryngeal closure;Penetration/Aspiration during swallow;Pharyngeal residue - valleculae;Pharyngeal residue - pyriform Pharyngeal Material enters airway, remains ABOVE vocal cords then ejected out;Material enters airway, remains ABOVE vocal cords and not ejected out;Material enters airway, CONTACTS cords and not ejected out Pharyngeal- Nectar Straw -- Pharyngeal -- Pharyngeal- Thin Teaspoon -- Pharyngeal -- Pharyngeal- Thin Cup Delayed swallow initiation-pyriform sinuses;Penetration/Aspiration during swallow;Reduced airway/laryngeal closure Pharyngeal Material enters airway, remains ABOVE vocal cords then ejected out;Material enters airway, remains ABOVE vocal cords and not ejected out;Material does not enter airway Pharyngeal- Thin Straw Delayed swallow initiation-pyriform sinuses;Reduced airway/laryngeal closure;Penetration/Aspiration during swallow  Pharyngeal Material enters airway, remains ABOVE vocal cords then ejected out Pharyngeal- Puree Delayed swallow initiation-vallecula;Reduced tongue base retraction Pharyngeal -- Pharyngeal- Mechanical Soft Delayed swallow initiation-vallecula;Reduced tongue base retraction;Pharyngeal residue - valleculae;Pharyngeal residue - pyriform;Reduced laryngeal elevation Pharyngeal -- Pharyngeal- Regular -- Pharyngeal -- Pharyngeal- Multi-consistency -- Pharyngeal -- Pharyngeal- Pill Reduced laryngeal elevation;Delayed swallow initiation-pyriform sinuses;Reduced pharyngeal peristalsis;Penetration/Aspiration during swallow;Pharyngeal residue - valleculae;Pharyngeal residue - pyriform Pharyngeal Material enters airway, remains ABOVE vocal cords then ejected out;Material enters airway, remains ABOVE vocal cords and not ejected out Pharyngeal Comment --  CHL IP CERVICAL ESOPHAGEAL PHASE 04/08/2021 Cervical Esophageal Phase Impaired Pudding Teaspoon -- Pudding Cup -- Honey Teaspoon -- Honey Cup -- Nectar Teaspoon -- Nectar Cup -- Nectar Straw -- Thin Teaspoon -- Thin Cup -- Thin Straw -- Puree -- Mechanical Soft -- Regular -- Multi-consistency -- Pill Reduced  cricopharyngeal relaxation Cervical Esophageal Comment -- Chelsea E Hartness MA, CCC-SLP 04/08/2021, 2:13 PM              EEG adult  Result Date: 04/08/2021 Charlsie Quest, MD     04/08/2021  9:59 AM Patient Name: Sheri [name] MRN: [id number] Epilepsy Attending: Charlsie Quest Referring Physician/Provider: Dr Hughie Closs Date: 04/08/2021 Duration: 23.21 mins Patient history: 87yo F with ams. EEG to evaluate for seizure Level of alertness: Awake AEDs during EEG study: None Technical aspects: This EEG study was done with scalp electrodes positioned according to the 10-20 International system of electrode placement. Electrical activity was acquired at a sampling rate of  and reviewed with a high frequency filter of  and a low frequency filter of . EEG data were  recorded continuously and digitally stored. Description: The posterior dominant rhythm consists of 8 Hz activity of moderate voltage (25-35 uV) seen predominantly in posterior head regions, symmetric and reactive to eye opening and eye closing. EEG showed continuous generalized 3 to 6 Hz theta-delta slowing. Hyperventilation and photic stimulation were not performed.   ABNORMALITY - Continuous slow, generalized IMPRESSION: This study is suggestive of mild to moderate diffuse encephalopathy, nonspecific etiology. No seizures or epileptiform discharges were seen throughout the recording. Charlsie Quest   DG Knee 3 Views Left  Result Date: 04/01/2021 CLINICAL DATA:  Knee pain EXAM: LEFT KNEE - 3 VIEW COMPARISON:  None. FINDINGS: Degenerative changes are noted most marked in the medial joint space. No acute fracture or dislocation is noted. No joint effusion is seen. No soft tissue abnormality is noted IMPRESSION: Degenerative change without acute abnormality. Electronically Signed   By: Alcide Clever M.D.   On: 04/01/2021 17:46   DG KNEE 3 VIEW RIGHT  Result Date: 04/01/2021 CLINICAL DATA:  Right knee pain, no known injury, initial encounter EXAM: RIGHT KNEE - 3 VIEW COMPARISON:  06/15/2016 FINDINGS: Tricompartmental degenerative changes are noted. No acute fracture or dislocation is seen. No joint effusion is noted. IMPRESSION: Degenerative change without acute abnormality. Electronically Signed   By: Alcide Clever M.D.   On: 04/01/2021 17:43    Microbiology: No results found for this or any previous visit (from the past 240 hour(s)).   Labs: Basic Metabolic Panel: Recent Labs  Lab 04/17/21 0159 04/18/21 0055 04/19/21 0952 04/20/21 0318 04/21/21 0128  NA 131* 130* 132* 134* 134*  K 3.3* 3.7 4.7 4.7 4.7  CL 92* 94* 99 99 99  CO2 32 GLUCOSE 104* 111* 100* 94 93  BUN 8 7* 8 7* 9  CREATININE 0.98 1.00 0.92 0.94 0.99  CALCIUM 8.6* 8.4* 8.4* 8.7* 8.7*  MG  --  1.7  --   --   --     Liver Function Tests: No results for input(s): AST, ALT, ALKPHOS, BILITOT, PROT, ALBUMIN in the last 168 hours. No results for input(s): LIPASE, AMYLASE in the last 168 hours. No results for input(s): AMMONIA in the last 168 hours. CBC: Recent Labs  Lab 04/16/21 0000 04/17/21 0159  WBC 9.6 8.5  HGB 8.9* 9.5*  HCT 27.5* 29.5*  MCV 85.4 85.8  PLT 368 376   Cardiac Enzymes: No results for input(s): CKTOTAL, CKMB, CKMBINDEX, TROPONINI in the last 168 hours. BNP: BNP (last 3 results) No results for input(s): BNP in the last 8760 hours.  ProBNP (last 3 results) No results for input(s): PROBNP in the last 8760 hours.  CBG: Recent Labs  Lab 04/20/21  1610 04/20/21 1137 04/20/21 1642 04/20/21 2108 04/21/21 0629  GLUCAP 98 103* 107* 111* 95       Signed:  Myrtie Neither, MD [facility] 04/21/2021, 9:37 AM

## 2021-04-22 ENCOUNTER — Other Ambulatory Visit: Payer: Self-pay

## 2021-04-22 DIAGNOSIS — J9611 Chronic respiratory failure with hypoxia: Secondary | ICD-10-CM

## 2021-04-22 DIAGNOSIS — M1990 Unspecified osteoarthritis, unspecified site: Secondary | ICD-10-CM | POA: Diagnosis not present

## 2021-04-22 DIAGNOSIS — J449 Chronic obstructive pulmonary disease, unspecified: Secondary | ICD-10-CM | POA: Diagnosis not present

## 2021-04-22 DIAGNOSIS — Z9181 History of falling: Secondary | ICD-10-CM | POA: Diagnosis not present

## 2021-04-22 DIAGNOSIS — I272 Pulmonary hypertension, unspecified: Secondary | ICD-10-CM | POA: Diagnosis not present

## 2021-04-22 DIAGNOSIS — D649 Anemia, unspecified: Secondary | ICD-10-CM | POA: Diagnosis not present

## 2021-04-22 DIAGNOSIS — Z7984 Long term (current) use of oral hypoglycemic drugs: Secondary | ICD-10-CM | POA: Diagnosis not present

## 2021-04-22 DIAGNOSIS — I10 Essential (primary) hypertension: Secondary | ICD-10-CM | POA: Diagnosis not present

## 2021-04-22 DIAGNOSIS — Z7951 Long term (current) use of inhaled steroids: Secondary | ICD-10-CM | POA: Diagnosis not present

## 2021-04-22 DIAGNOSIS — H409 Unspecified glaucoma: Secondary | ICD-10-CM | POA: Diagnosis not present

## 2021-04-22 DIAGNOSIS — E119 Type 2 diabetes mellitus without complications: Secondary | ICD-10-CM | POA: Diagnosis not present

## 2021-04-22 DIAGNOSIS — J441 Chronic obstructive pulmonary disease with (acute) exacerbation: Secondary | ICD-10-CM

## 2021-04-22 DIAGNOSIS — Z9981 Dependence on supplemental oxygen: Secondary | ICD-10-CM | POA: Diagnosis not present

## 2021-04-22 NOTE — Patient Outreach (Signed)
[facility]) Care Management  04/22/2021  Sheri [name] [birth year] [id number]   Call placed to [phone] and spoke with daughter, [name], on [place], HIPAA verified.  She states her mother is with her sister and home health has reached out and NIV and equipment is in the home.  Explained reason for call regarding complex care management as a benefit of her [employer].  She endorses Dr. Renaye Rakers as the primary care provider and is listed to provide the [facility] [Transition of Care] follow up call and appointment. She verbalizes, "we need all the help we can get."  Plan:  Will assign patient to [facility] CM team for post hospital care management.  For questions, please contact:  Charlesetta Shanks, RN BSN CCM [facility]  [phone] business mobile phone Toll free office [phone]  Fax number: [phone] [email or web address]   Plan: W

## 2021-04-23 ENCOUNTER — Other Ambulatory Visit: Payer: Self-pay | Admitting: *Deleted

## 2021-04-23 NOTE — Patient Outreach (Signed)
[facility]) Care Management  04/23/2021  Sheri [name] October 30, 1926 [id number]   Referral Date: 6/22 Referral Source: Hospital liaison Referral Reason: Post hospital follow up Insurance: [facility]   Outreach attempt #1, unsuccessful to daughter as requested in chart and per hospital liaison.  HIPAA compliant voice message left.  Plan: RN CM will send outreach letter and follow up within the next 3-4 business days.  Kemper Durie, [place], MSN [facility] Care Management  [facility] Manager [phone]

## 2021-04-24 ENCOUNTER — Other Ambulatory Visit: Payer: Self-pay | Admitting: *Deleted

## 2021-04-24 NOTE — Patient Outreach (Signed)
[facility]) Care Management  2021/05/21  AHYANA SKILLIN Mar 21, 1926 [id number]   Voice message received back from daughter after missed call yesterday.  Call placed to [name], state member passed away this morning.  Report they have support in the home for grief process, denies any needs at this time.  Will close case.  Kemper Durie, [place], MSN [facility] Care Management  [facility] Manager [phone]

## 2021-04-24 NOTE — Telephone Encounter (Signed)
Pt's upcoming appt with Beth on 6/27 was cancelled as pt has passed away. Routing to Dr. Francine Graven as an Lorain Childes as he was the one who saw pt while she was in the hospital 6/8.

## 2021-04-27 ENCOUNTER — Ambulatory Visit: Payer: Medicare Other | Admitting: Primary Care

## 2021-04-28 ENCOUNTER — Ambulatory Visit: Payer: Self-pay | Admitting: *Deleted

## 2021-04-30 DIAGNOSIS — I110 Hypertensive heart disease with heart failure: Secondary | ICD-10-CM | POA: Diagnosis not present

## 2021-04-30 DIAGNOSIS — E1169 Type 2 diabetes mellitus with other specified complication: Secondary | ICD-10-CM | POA: Diagnosis not present

## 2021-04-30 DIAGNOSIS — I509 Heart failure, unspecified: Secondary | ICD-10-CM | POA: Diagnosis not present

## 2021-04-30 DIAGNOSIS — J449 Chronic obstructive pulmonary disease, unspecified: Secondary | ICD-10-CM | POA: Diagnosis not present

## 2021-05-01 DEATH — deceased

## 2021-09-28 ENCOUNTER — Other Ambulatory Visit: Payer: Medicare Other

## 2021-10-05 ENCOUNTER — Ambulatory Visit: Payer: Medicare Other | Admitting: Cardiology
